# Patient Record
Sex: Male | Born: 1945 | Race: Black or African American | Hispanic: No | Smoking: Never smoker
Health system: Southern US, Community
[De-identification: ages and names within clinical notes are randomized; demographics above are authoritative.]

## PROBLEM LIST (undated history)

## (undated) DIAGNOSIS — F32A Depression, unspecified: Secondary | ICD-10-CM

## (undated) DIAGNOSIS — D649 Anemia, unspecified: Secondary | ICD-10-CM

## (undated) DIAGNOSIS — F329 Major depressive disorder, single episode, unspecified: Secondary | ICD-10-CM

## (undated) DIAGNOSIS — C61 Malignant neoplasm of prostate: Secondary | ICD-10-CM

## (undated) DIAGNOSIS — I10 Essential (primary) hypertension: Secondary | ICD-10-CM

## (undated) DIAGNOSIS — M8660 Other chronic osteomyelitis, unspecified site: Secondary | ICD-10-CM

## (undated) DIAGNOSIS — M869 Osteomyelitis, unspecified: Secondary | ICD-10-CM

## (undated) HISTORY — DX: Major depressive disorder, single episode, unspecified: F32.9

## (undated) HISTORY — DX: Osteomyelitis, unspecified: M86.9

## (undated) HISTORY — DX: Depression, unspecified: F32.A

## (undated) HISTORY — PX: RHINOPLASTY: SUR1284

## (undated) HISTORY — DX: Essential (primary) hypertension: I10

## (undated) HISTORY — PX: PROSTATE BIOPSY: SHX241

## (undated) HISTORY — DX: Anemia, unspecified: D64.9

## (undated) HISTORY — DX: Other chronic osteomyelitis, unspecified site: M86.60

## (undated) HISTORY — PX: HERNIA REPAIR: SHX51

---

## 2001-12-17 ENCOUNTER — Encounter: Payer: Self-pay | Admitting: Emergency Medicine

## 2001-12-17 ENCOUNTER — Inpatient Hospital Stay (HOSPITAL_COMMUNITY): Admission: EM | Admit: 2001-12-17 | Discharge: 2001-12-24 | Payer: Self-pay | Admitting: Emergency Medicine

## 2001-12-18 ENCOUNTER — Encounter: Payer: Self-pay | Admitting: Internal Medicine

## 2001-12-19 ENCOUNTER — Encounter: Payer: Self-pay | Admitting: Internal Medicine

## 2001-12-19 ENCOUNTER — Encounter (INDEPENDENT_AMBULATORY_CARE_PROVIDER_SITE_OTHER): Payer: Self-pay | Admitting: Interventional Cardiology

## 2001-12-21 ENCOUNTER — Encounter: Payer: Self-pay | Admitting: Internal Medicine

## 2001-12-28 ENCOUNTER — Encounter: Admission: RE | Admit: 2001-12-28 | Discharge: 2001-12-28 | Payer: Self-pay | Admitting: Internal Medicine

## 2001-12-31 DIAGNOSIS — M869 Osteomyelitis, unspecified: Secondary | ICD-10-CM

## 2001-12-31 HISTORY — DX: Osteomyelitis, unspecified: M86.9

## 2002-01-04 ENCOUNTER — Encounter: Admission: RE | Admit: 2002-01-04 | Discharge: 2002-01-04 | Payer: Self-pay | Admitting: Internal Medicine

## 2002-01-11 ENCOUNTER — Encounter: Admission: RE | Admit: 2002-01-11 | Discharge: 2002-01-11 | Payer: Self-pay | Admitting: Internal Medicine

## 2002-01-13 ENCOUNTER — Encounter: Admission: RE | Admit: 2002-01-13 | Discharge: 2002-01-13 | Payer: Self-pay | Admitting: Internal Medicine

## 2002-01-16 ENCOUNTER — Encounter: Payer: Self-pay | Admitting: Internal Medicine

## 2002-01-16 ENCOUNTER — Inpatient Hospital Stay (HOSPITAL_COMMUNITY): Admission: AD | Admit: 2002-01-16 | Discharge: 2002-01-23 | Payer: Self-pay | Admitting: Internal Medicine

## 2002-01-16 ENCOUNTER — Encounter: Admission: RE | Admit: 2002-01-16 | Discharge: 2002-01-16 | Payer: Self-pay | Admitting: Internal Medicine

## 2002-01-17 ENCOUNTER — Encounter: Payer: Self-pay | Admitting: Internal Medicine

## 2002-01-18 ENCOUNTER — Encounter: Payer: Self-pay | Admitting: Internal Medicine

## 2002-01-18 ENCOUNTER — Encounter (INDEPENDENT_AMBULATORY_CARE_PROVIDER_SITE_OTHER): Payer: Self-pay | Admitting: *Deleted

## 2002-01-20 ENCOUNTER — Encounter: Payer: Self-pay | Admitting: Internal Medicine

## 2002-01-30 HISTORY — PX: LYMPH NODE BIOPSY: SHX201

## 2002-02-01 ENCOUNTER — Encounter: Admission: RE | Admit: 2002-02-01 | Discharge: 2002-02-01 | Payer: Self-pay | Admitting: Internal Medicine

## 2002-02-03 ENCOUNTER — Emergency Department (HOSPITAL_COMMUNITY): Admission: EM | Admit: 2002-02-03 | Discharge: 2002-02-03 | Payer: Self-pay | Admitting: Emergency Medicine

## 2002-02-09 ENCOUNTER — Encounter: Admission: RE | Admit: 2002-02-09 | Discharge: 2002-02-09 | Payer: Self-pay | Admitting: Internal Medicine

## 2002-02-13 ENCOUNTER — Encounter: Payer: Self-pay | Admitting: Emergency Medicine

## 2002-02-14 ENCOUNTER — Inpatient Hospital Stay (HOSPITAL_COMMUNITY): Admission: EM | Admit: 2002-02-14 | Discharge: 2002-02-19 | Payer: Self-pay | Admitting: Emergency Medicine

## 2002-02-17 ENCOUNTER — Encounter (INDEPENDENT_AMBULATORY_CARE_PROVIDER_SITE_OTHER): Payer: Self-pay | Admitting: *Deleted

## 2002-03-02 DIAGNOSIS — M8660 Other chronic osteomyelitis, unspecified site: Secondary | ICD-10-CM

## 2002-03-02 HISTORY — DX: Other chronic osteomyelitis, unspecified site: M86.60

## 2002-03-08 ENCOUNTER — Encounter: Admission: RE | Admit: 2002-03-08 | Discharge: 2002-03-08 | Payer: Self-pay | Admitting: Internal Medicine

## 2002-04-12 ENCOUNTER — Encounter: Admission: RE | Admit: 2002-04-12 | Discharge: 2002-04-12 | Payer: Self-pay | Admitting: Internal Medicine

## 2002-04-19 ENCOUNTER — Encounter: Payer: Self-pay | Admitting: Internal Medicine

## 2002-04-19 ENCOUNTER — Ambulatory Visit (HOSPITAL_COMMUNITY): Admission: RE | Admit: 2002-04-19 | Discharge: 2002-04-19 | Payer: Self-pay | Admitting: Internal Medicine

## 2002-04-19 DIAGNOSIS — M8618 Other acute osteomyelitis, other site: Secondary | ICD-10-CM

## 2002-04-26 ENCOUNTER — Encounter: Admission: RE | Admit: 2002-04-26 | Discharge: 2002-04-26 | Payer: Self-pay | Admitting: Internal Medicine

## 2002-06-02 ENCOUNTER — Encounter: Admission: RE | Admit: 2002-06-02 | Discharge: 2002-06-02 | Payer: Self-pay | Admitting: Internal Medicine

## 2002-07-08 ENCOUNTER — Emergency Department (HOSPITAL_COMMUNITY): Admission: EM | Admit: 2002-07-08 | Discharge: 2002-07-09 | Payer: Self-pay | Admitting: *Deleted

## 2002-07-09 ENCOUNTER — Encounter: Payer: Self-pay | Admitting: Emergency Medicine

## 2002-07-19 ENCOUNTER — Encounter: Admission: RE | Admit: 2002-07-19 | Discharge: 2002-07-19 | Payer: Self-pay | Admitting: Internal Medicine

## 2002-09-11 ENCOUNTER — Encounter: Admission: RE | Admit: 2002-09-11 | Discharge: 2002-09-11 | Payer: Self-pay | Admitting: Internal Medicine

## 2002-10-15 ENCOUNTER — Encounter: Payer: Self-pay | Admitting: Hematology & Oncology

## 2002-10-15 ENCOUNTER — Ambulatory Visit (HOSPITAL_COMMUNITY): Admission: RE | Admit: 2002-10-15 | Discharge: 2002-10-15 | Payer: Self-pay | Admitting: Hematology & Oncology

## 2002-11-20 ENCOUNTER — Encounter: Admission: RE | Admit: 2002-11-20 | Discharge: 2002-11-20 | Payer: Self-pay | Admitting: Internal Medicine

## 2002-12-06 ENCOUNTER — Ambulatory Visit (HOSPITAL_COMMUNITY): Admission: RE | Admit: 2002-12-06 | Discharge: 2002-12-06 | Payer: Self-pay | Admitting: Internal Medicine

## 2002-12-06 ENCOUNTER — Encounter: Payer: Self-pay | Admitting: Internal Medicine

## 2003-01-02 ENCOUNTER — Ambulatory Visit (HOSPITAL_COMMUNITY): Admission: RE | Admit: 2003-01-02 | Discharge: 2003-01-02 | Payer: Self-pay | Admitting: Internal Medicine

## 2003-01-24 ENCOUNTER — Encounter: Admission: RE | Admit: 2003-01-24 | Discharge: 2003-01-24 | Payer: Self-pay | Admitting: Internal Medicine

## 2003-01-26 ENCOUNTER — Emergency Department (HOSPITAL_COMMUNITY): Admission: EM | Admit: 2003-01-26 | Discharge: 2003-01-26 | Payer: Self-pay | Admitting: *Deleted

## 2003-01-29 ENCOUNTER — Ambulatory Visit (HOSPITAL_COMMUNITY): Admission: RE | Admit: 2003-01-29 | Discharge: 2003-01-29 | Payer: Self-pay | Admitting: Internal Medicine

## 2003-03-07 ENCOUNTER — Encounter: Admission: RE | Admit: 2003-03-07 | Discharge: 2003-03-07 | Payer: Self-pay | Admitting: Internal Medicine

## 2003-03-12 ENCOUNTER — Ambulatory Visit (HOSPITAL_COMMUNITY): Admission: RE | Admit: 2003-03-12 | Discharge: 2003-03-12 | Payer: Self-pay | Admitting: Gastroenterology

## 2003-03-15 ENCOUNTER — Encounter: Admission: RE | Admit: 2003-03-15 | Discharge: 2003-04-06 | Payer: Self-pay | Admitting: Internal Medicine

## 2003-03-21 ENCOUNTER — Encounter: Admission: RE | Admit: 2003-03-21 | Discharge: 2003-03-21 | Payer: Self-pay | Admitting: Internal Medicine

## 2003-03-26 ENCOUNTER — Encounter: Admission: RE | Admit: 2003-03-26 | Discharge: 2003-03-26 | Payer: Self-pay | Admitting: Internal Medicine

## 2003-03-29 ENCOUNTER — Ambulatory Visit (HOSPITAL_COMMUNITY): Admission: RE | Admit: 2003-03-29 | Discharge: 2003-03-29 | Payer: Self-pay | Admitting: Internal Medicine

## 2003-03-29 ENCOUNTER — Encounter: Payer: Self-pay | Admitting: Cardiology

## 2003-08-20 ENCOUNTER — Ambulatory Visit (HOSPITAL_COMMUNITY): Admission: RE | Admit: 2003-08-20 | Discharge: 2003-08-20 | Payer: Self-pay | Admitting: Internal Medicine

## 2003-08-20 ENCOUNTER — Encounter: Admission: RE | Admit: 2003-08-20 | Discharge: 2003-08-20 | Payer: Self-pay | Admitting: Internal Medicine

## 2003-08-23 ENCOUNTER — Encounter: Admission: RE | Admit: 2003-08-23 | Discharge: 2003-08-23 | Payer: Self-pay | Admitting: Internal Medicine

## 2003-09-11 ENCOUNTER — Encounter: Admission: RE | Admit: 2003-09-11 | Discharge: 2003-09-11 | Payer: Self-pay | Admitting: Internal Medicine

## 2003-09-18 ENCOUNTER — Encounter: Admission: RE | Admit: 2003-09-18 | Discharge: 2003-09-18 | Payer: Self-pay | Admitting: Internal Medicine

## 2003-11-12 ENCOUNTER — Ambulatory Visit: Payer: Self-pay | Admitting: Internal Medicine

## 2003-11-14 ENCOUNTER — Ambulatory Visit: Payer: Self-pay | Admitting: Internal Medicine

## 2003-11-26 ENCOUNTER — Ambulatory Visit: Payer: Self-pay | Admitting: Internal Medicine

## 2003-11-30 ENCOUNTER — Ambulatory Visit (HOSPITAL_COMMUNITY): Admission: RE | Admit: 2003-11-30 | Discharge: 2003-11-30 | Payer: Self-pay | Admitting: Internal Medicine

## 2003-11-30 ENCOUNTER — Ambulatory Visit: Payer: Self-pay | Admitting: Internal Medicine

## 2003-12-13 ENCOUNTER — Ambulatory Visit: Payer: Self-pay | Admitting: Internal Medicine

## 2004-05-05 ENCOUNTER — Ambulatory Visit: Payer: Self-pay | Admitting: Internal Medicine

## 2004-06-10 ENCOUNTER — Ambulatory Visit (HOSPITAL_COMMUNITY): Admission: RE | Admit: 2004-06-10 | Discharge: 2004-06-10 | Payer: Self-pay | Admitting: Internal Medicine

## 2004-06-10 ENCOUNTER — Ambulatory Visit: Payer: Self-pay | Admitting: Internal Medicine

## 2004-06-14 ENCOUNTER — Emergency Department (HOSPITAL_COMMUNITY): Admission: EM | Admit: 2004-06-14 | Discharge: 2004-06-14 | Payer: Self-pay | Admitting: Emergency Medicine

## 2004-07-09 ENCOUNTER — Ambulatory Visit (HOSPITAL_COMMUNITY): Admission: RE | Admit: 2004-07-09 | Discharge: 2004-07-09 | Payer: Self-pay | Admitting: Internal Medicine

## 2004-07-09 ENCOUNTER — Ambulatory Visit: Payer: Self-pay | Admitting: Internal Medicine

## 2004-07-30 ENCOUNTER — Ambulatory Visit: Payer: Self-pay | Admitting: Internal Medicine

## 2004-08-26 ENCOUNTER — Ambulatory Visit: Payer: Self-pay | Admitting: Internal Medicine

## 2004-09-01 ENCOUNTER — Emergency Department (HOSPITAL_COMMUNITY): Admission: EM | Admit: 2004-09-01 | Discharge: 2004-09-01 | Payer: Self-pay | Admitting: Emergency Medicine

## 2004-11-02 IMAGING — CR DG CHEST 2V
2 series · 2 of 2 positions shown · non-contrast
Comparison: none

CLINICAL DATA: 57-year-old male short of breath hypertension.
 DIAGNOSTIC CHEST, TWO VIEWS 11/30/03

[view not recorded (1 of 2)]
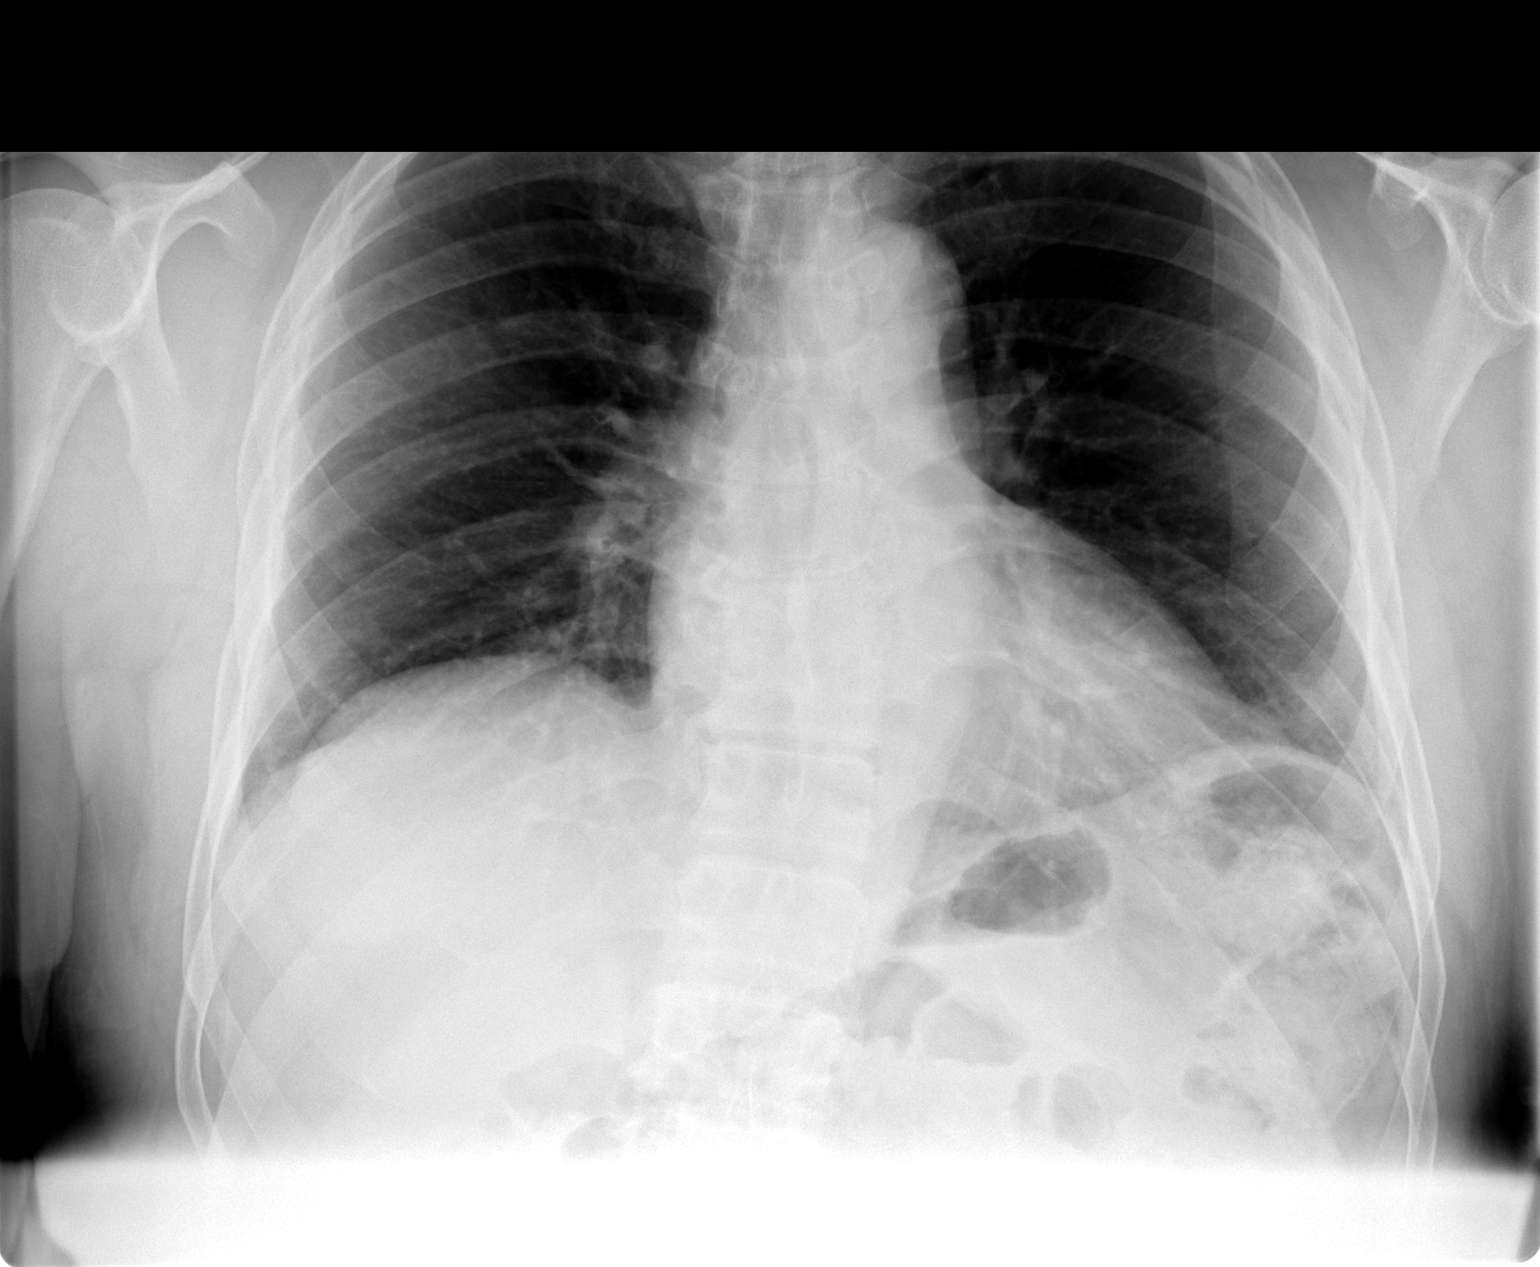

[view not recorded (2 of 2)]
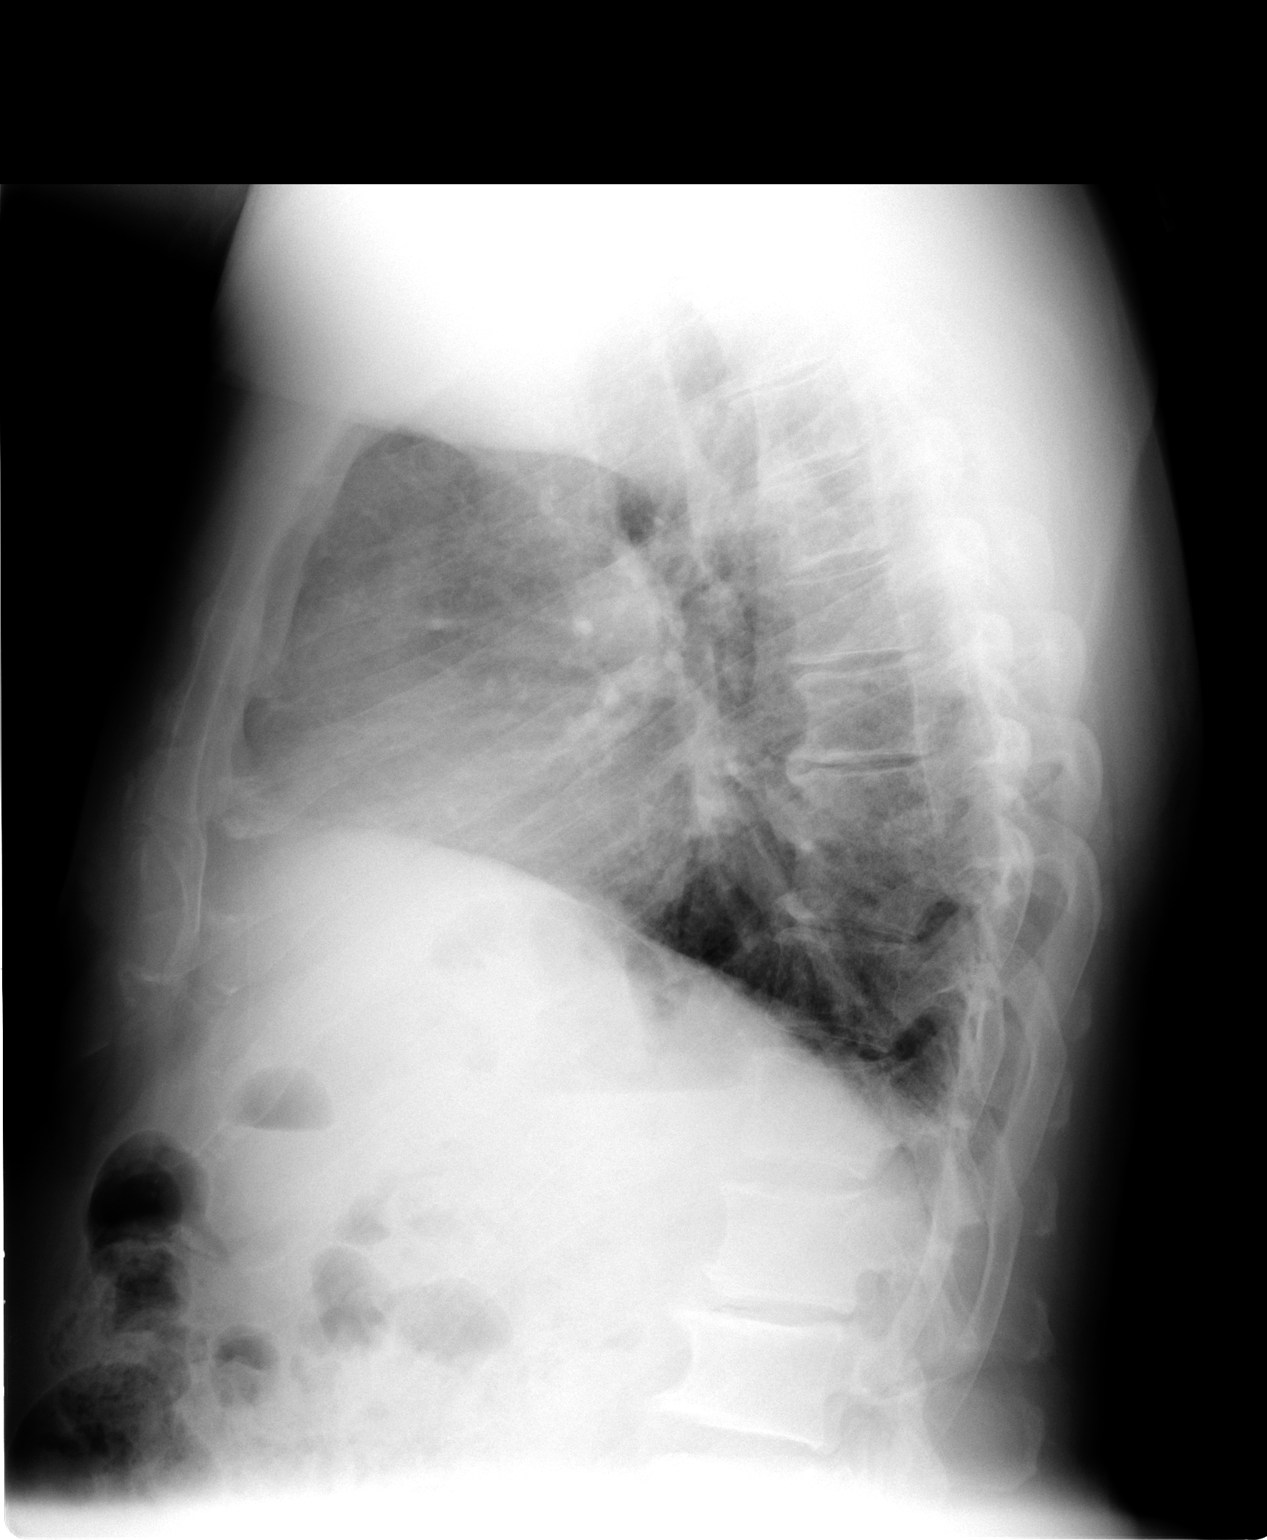

[2 of 2 positions shown; findings below may reference images not displayed]

FINDINGS: Lung volumes are decreased with bibasilar and perihilar atelectasis. Mild central
 peribronchial thickening. No consolidation, effusion, edema, or pneumothorax. Mild scoliosis. On
 the lateral view, there is obliteration of the T8-T9 disc space which is a known area of chronic
 spondylitic discitis
IMPRESSION: 1. Low volume exam and bibasilar atelectasis.
 2. T8-T9 chronic spine changes related to previous spondylitic discitis.

## 2005-02-26 ENCOUNTER — Ambulatory Visit: Payer: Self-pay | Admitting: Internal Medicine

## 2005-03-11 ENCOUNTER — Ambulatory Visit (HOSPITAL_COMMUNITY): Admission: RE | Admit: 2005-03-11 | Discharge: 2005-03-11 | Payer: Self-pay | Admitting: Internal Medicine

## 2005-03-11 ENCOUNTER — Ambulatory Visit: Payer: Self-pay | Admitting: Internal Medicine

## 2005-03-18 ENCOUNTER — Ambulatory Visit: Payer: Self-pay | Admitting: Internal Medicine

## 2005-05-25 ENCOUNTER — Ambulatory Visit: Payer: Self-pay | Admitting: Internal Medicine

## 2005-10-30 ENCOUNTER — Ambulatory Visit: Payer: Self-pay | Admitting: Internal Medicine

## 2005-12-17 ENCOUNTER — Ambulatory Visit: Payer: Self-pay | Admitting: Internal Medicine

## 2005-12-17 DIAGNOSIS — I10 Essential (primary) hypertension: Secondary | ICD-10-CM | POA: Insufficient documentation

## 2005-12-17 DIAGNOSIS — M4800 Spinal stenosis, site unspecified: Secondary | ICD-10-CM | POA: Insufficient documentation

## 2005-12-17 DIAGNOSIS — D649 Anemia, unspecified: Secondary | ICD-10-CM

## 2005-12-17 DIAGNOSIS — R9430 Abnormal result of cardiovascular function study, unspecified: Secondary | ICD-10-CM | POA: Insufficient documentation

## 2005-12-17 DIAGNOSIS — D709 Neutropenia, unspecified: Secondary | ICD-10-CM | POA: Insufficient documentation

## 2005-12-17 DIAGNOSIS — L408 Other psoriasis: Secondary | ICD-10-CM

## 2005-12-17 DIAGNOSIS — R609 Edema, unspecified: Secondary | ICD-10-CM

## 2005-12-29 ENCOUNTER — Encounter (INDEPENDENT_AMBULATORY_CARE_PROVIDER_SITE_OTHER): Payer: Self-pay | Admitting: Internal Medicine

## 2005-12-29 ENCOUNTER — Ambulatory Visit: Payer: Self-pay | Admitting: Internal Medicine

## 2005-12-29 LAB — CONVERTED CEMR LAB
ALT: 22 units/L (ref 0–40)
Albumin: 3.9 g/dL (ref 3.5–5.2)
Alkaline Phosphatase: 62 units/L (ref 39–117)
CO2: 29 meq/L (ref 19–32)
Calcium: 9 mg/dL (ref 8.4–10.5)
Cholesterol: 185 mg/dL (ref 0–200)
HCT: 39.3 % (ref 39.0–52.0)
Hemoglobin: 13.1 g/dL (ref 13.0–17.0)
INR: 1 (ref 0.0–1.5)
MCHC: 33.4 g/dL (ref 30.0–36.0)
Monocytes Absolute: 0.5 10*3/uL (ref 0.2–0.7)
Neutro Abs: 2 10*3/uL (ref 1.7–7.7)
Neutrophils Relative %: 49 % (ref 43–77)
Potassium: 3.6 meq/L (ref 3.5–5.3)
Prothrombin Time: 13.6 s (ref 11.6–15.2)
RBC: 4.37 M/uL (ref 4.22–5.81)
RDW: 12.8 % (ref 11.5–14.0)
Total CHOL/HDL Ratio: 3.9
Triglycerides: 107 mg/dL (ref <150)
aPTT: 29 s (ref 24–37)

## 2006-01-14 ENCOUNTER — Encounter (INDEPENDENT_AMBULATORY_CARE_PROVIDER_SITE_OTHER): Payer: Self-pay | Admitting: *Deleted

## 2006-01-14 ENCOUNTER — Ambulatory Visit: Payer: Self-pay | Admitting: Internal Medicine

## 2006-01-14 LAB — CONVERTED CEMR LAB
CO2: 30 meq/L (ref 19–32)
Glucose, Bld: 98 mg/dL (ref 70–99)
Potassium: 3.9 meq/L (ref 3.5–5.3)
Sodium: 141 meq/L (ref 135–145)

## 2006-02-16 DIAGNOSIS — S22009A Unspecified fracture of unspecified thoracic vertebra, initial encounter for closed fracture: Secondary | ICD-10-CM | POA: Insufficient documentation

## 2006-03-02 DIAGNOSIS — D649 Anemia, unspecified: Secondary | ICD-10-CM

## 2006-03-02 HISTORY — DX: Anemia, unspecified: D64.9

## 2006-04-01 ENCOUNTER — Ambulatory Visit: Payer: Self-pay | Admitting: Internal Medicine

## 2006-04-01 ENCOUNTER — Encounter (INDEPENDENT_AMBULATORY_CARE_PROVIDER_SITE_OTHER): Payer: Self-pay | Admitting: Internal Medicine

## 2006-04-01 DIAGNOSIS — M25519 Pain in unspecified shoulder: Secondary | ICD-10-CM

## 2006-04-01 LAB — CONVERTED CEMR LAB
BUN: 14 mg/dL (ref 6–23)
CO2: 29 meq/L (ref 19–32)
Calcium: 9.3 mg/dL (ref 8.4–10.5)
Creatinine, Ser: 0.94 mg/dL (ref 0.40–1.50)
Glucose, Bld: 106 mg/dL — ABNORMAL HIGH (ref 70–99)

## 2006-06-21 ENCOUNTER — Ambulatory Visit: Payer: Self-pay | Admitting: Internal Medicine

## 2006-08-05 ENCOUNTER — Telehealth (INDEPENDENT_AMBULATORY_CARE_PROVIDER_SITE_OTHER): Payer: Self-pay | Admitting: *Deleted

## 2006-08-29 ENCOUNTER — Emergency Department (HOSPITAL_COMMUNITY): Admission: EM | Admit: 2006-08-29 | Discharge: 2006-08-29 | Payer: Self-pay | Admitting: Emergency Medicine

## 2006-12-24 ENCOUNTER — Ambulatory Visit (HOSPITAL_COMMUNITY): Admission: RE | Admit: 2006-12-24 | Discharge: 2006-12-24 | Payer: Self-pay | Admitting: *Deleted

## 2006-12-24 ENCOUNTER — Ambulatory Visit: Payer: Self-pay | Admitting: *Deleted

## 2006-12-24 ENCOUNTER — Telehealth: Payer: Self-pay | Admitting: *Deleted

## 2007-01-10 ENCOUNTER — Ambulatory Visit: Payer: Self-pay | Admitting: Internal Medicine

## 2007-01-10 ENCOUNTER — Encounter: Payer: Self-pay | Admitting: Internal Medicine

## 2007-01-10 DIAGNOSIS — G4700 Insomnia, unspecified: Secondary | ICD-10-CM

## 2007-01-10 LAB — CONVERTED CEMR LAB
BUN: 17 mg/dL (ref 6–23)
Eosinophils Relative: 5 % (ref 0–5)
Glucose, Bld: 117 mg/dL — ABNORMAL HIGH (ref 70–99)
Lymphs Abs: 1.6 10*3/uL (ref 0.7–4.0)
Monocytes Absolute: 0.3 10*3/uL (ref 0.1–1.0)
Monocytes Relative: 8 % (ref 3–12)
Neutro Abs: 1.7 10*3/uL (ref 1.7–7.7)
Neutrophils Relative %: 45 % (ref 43–77)
Sodium: 142 meq/L (ref 135–145)
WBC: 3.9 10*3/uL — ABNORMAL LOW (ref 4.0–10.5)

## 2007-01-10 LAB — CBC AND DIFFERENTIAL: WBC: 3.9 10^3/mL

## 2007-02-09 ENCOUNTER — Ambulatory Visit: Payer: Self-pay | Admitting: Internal Medicine

## 2007-05-04 ENCOUNTER — Ambulatory Visit: Payer: Self-pay | Admitting: Hospitalist

## 2007-05-04 ENCOUNTER — Encounter (INDEPENDENT_AMBULATORY_CARE_PROVIDER_SITE_OTHER): Payer: Self-pay | Admitting: Internal Medicine

## 2007-05-04 DIAGNOSIS — R350 Frequency of micturition: Secondary | ICD-10-CM | POA: Insufficient documentation

## 2007-05-04 DIAGNOSIS — F524 Premature ejaculation: Secondary | ICD-10-CM

## 2007-05-04 LAB — CONVERTED CEMR LAB
CO2: 26 meq/L (ref 19–32)
Hemoglobin, Urine: NEGATIVE
Ketones, ur: NEGATIVE mg/dL
Leukocytes, UA: NEGATIVE
Nitrite: NEGATIVE
TSH: 1.781 microintl units/mL (ref 0.350–5.50)

## 2007-05-05 ENCOUNTER — Encounter (INDEPENDENT_AMBULATORY_CARE_PROVIDER_SITE_OTHER): Payer: Self-pay | Admitting: Internal Medicine

## 2007-05-09 ENCOUNTER — Telehealth: Payer: Self-pay | Admitting: *Deleted

## 2007-05-18 ENCOUNTER — Ambulatory Visit: Payer: Self-pay | Admitting: Infectious Diseases

## 2007-05-18 DIAGNOSIS — F329 Major depressive disorder, single episode, unspecified: Secondary | ICD-10-CM

## 2007-07-08 ENCOUNTER — Telehealth: Payer: Self-pay | Admitting: *Deleted

## 2007-07-09 ENCOUNTER — Emergency Department (HOSPITAL_COMMUNITY): Admission: EM | Admit: 2007-07-09 | Discharge: 2007-07-09 | Payer: Self-pay | Admitting: Emergency Medicine

## 2007-09-22 ENCOUNTER — Telehealth: Payer: Self-pay | Admitting: Infectious Diseases

## 2008-01-30 ENCOUNTER — Telehealth: Payer: Self-pay | Admitting: Infectious Diseases

## 2008-03-07 ENCOUNTER — Encounter (INDEPENDENT_AMBULATORY_CARE_PROVIDER_SITE_OTHER): Payer: Self-pay | Admitting: *Deleted

## 2008-03-09 ENCOUNTER — Telehealth (INDEPENDENT_AMBULATORY_CARE_PROVIDER_SITE_OTHER): Payer: Self-pay | Admitting: *Deleted

## 2008-03-12 ENCOUNTER — Telehealth (INDEPENDENT_AMBULATORY_CARE_PROVIDER_SITE_OTHER): Payer: Self-pay | Admitting: *Deleted

## 2008-03-14 ENCOUNTER — Encounter (INDEPENDENT_AMBULATORY_CARE_PROVIDER_SITE_OTHER): Payer: Self-pay | Admitting: Internal Medicine

## 2008-03-14 ENCOUNTER — Ambulatory Visit: Payer: Self-pay | Admitting: Internal Medicine

## 2008-03-14 ENCOUNTER — Encounter (INDEPENDENT_AMBULATORY_CARE_PROVIDER_SITE_OTHER): Payer: Self-pay | Admitting: *Deleted

## 2008-03-14 LAB — CONVERTED CEMR LAB
Calcium: 8.7 mg/dL (ref 8.4–10.5)
Creatinine, Ser: 0.96 mg/dL (ref 0.40–1.50)
Potassium: 3.9 meq/L (ref 3.5–5.3)
Sodium: 140 meq/L (ref 135–145)

## 2008-03-28 ENCOUNTER — Encounter: Payer: Self-pay | Admitting: Internal Medicine

## 2008-03-28 ENCOUNTER — Ambulatory Visit: Payer: Self-pay | Admitting: Internal Medicine

## 2008-03-28 LAB — CONVERTED CEMR LAB
CO2: 27 meq/L (ref 19–32)
Calcium: 9.4 mg/dL (ref 8.4–10.5)
Cholesterol: 162 mg/dL (ref 0–200)
LDL Cholesterol: 91 mg/dL (ref 0–99)
VLDL: 23 mg/dL (ref 0–40)

## 2008-04-03 ENCOUNTER — Ambulatory Visit: Payer: Self-pay | Admitting: Vascular Surgery

## 2008-04-03 ENCOUNTER — Ambulatory Visit: Payer: Self-pay | Admitting: Internal Medicine

## 2008-04-03 ENCOUNTER — Ambulatory Visit: Admission: RE | Admit: 2008-04-03 | Discharge: 2008-04-03 | Payer: Self-pay | Admitting: Internal Medicine

## 2008-04-03 ENCOUNTER — Encounter (INDEPENDENT_AMBULATORY_CARE_PROVIDER_SITE_OTHER): Payer: Self-pay | Admitting: Internal Medicine

## 2008-04-03 ENCOUNTER — Encounter: Payer: Self-pay | Admitting: Internal Medicine

## 2008-04-03 DIAGNOSIS — M79609 Pain in unspecified limb: Secondary | ICD-10-CM

## 2008-04-05 ENCOUNTER — Ambulatory Visit: Payer: Self-pay | Admitting: *Deleted

## 2008-04-06 ENCOUNTER — Telehealth (INDEPENDENT_AMBULATORY_CARE_PROVIDER_SITE_OTHER): Payer: Self-pay | Admitting: *Deleted

## 2008-05-22 ENCOUNTER — Ambulatory Visit: Payer: Self-pay | Admitting: Internal Medicine

## 2008-05-22 ENCOUNTER — Encounter (INDEPENDENT_AMBULATORY_CARE_PROVIDER_SITE_OTHER): Payer: Self-pay | Admitting: Internal Medicine

## 2008-05-23 LAB — CONVERTED CEMR LAB
BUN: 16 mg/dL (ref 6–23)
CO2: 27 meq/L (ref 19–32)
Creatinine, Ser: 1.12 mg/dL (ref 0.40–1.50)
Glucose, Bld: 105 mg/dL — ABNORMAL HIGH (ref 70–99)
Sodium: 139 meq/L (ref 135–145)

## 2008-06-05 ENCOUNTER — Ambulatory Visit: Payer: Self-pay | Admitting: Internal Medicine

## 2008-06-05 ENCOUNTER — Encounter: Payer: Self-pay | Admitting: Internal Medicine

## 2008-06-06 LAB — CONVERTED CEMR LAB
BUN: 18 mg/dL (ref 6–23)
CO2: 28 meq/L (ref 19–32)
Calcium: 9.4 mg/dL (ref 8.4–10.5)
Creatinine, Ser: 1.18 mg/dL (ref 0.40–1.50)
GFR calc Af Amer: >60 mL/min (ref >60)
Sodium: 140 meq/L (ref 135–145)

## 2008-06-26 ENCOUNTER — Telehealth: Payer: Self-pay | Admitting: *Deleted

## 2008-06-28 ENCOUNTER — Encounter (INDEPENDENT_AMBULATORY_CARE_PROVIDER_SITE_OTHER): Payer: Self-pay | Admitting: *Deleted

## 2008-06-28 ENCOUNTER — Ambulatory Visit: Payer: Self-pay | Admitting: Internal Medicine

## 2008-06-28 LAB — CONVERTED CEMR LAB
Creatinine,U: 188.5 mg/dL
Methadone: NEGATIVE
Opiates: NEGATIVE
Propoxyphene: NEGATIVE

## 2008-07-05 ENCOUNTER — Encounter: Payer: Self-pay | Admitting: Gastroenterology

## 2008-12-10 ENCOUNTER — Ambulatory Visit (HOSPITAL_COMMUNITY): Admission: RE | Admit: 2008-12-10 | Discharge: 2008-12-10 | Payer: Self-pay | Admitting: Internal Medicine

## 2008-12-10 ENCOUNTER — Ambulatory Visit: Payer: Self-pay | Admitting: Internal Medicine

## 2009-02-18 ENCOUNTER — Emergency Department (HOSPITAL_COMMUNITY): Admission: EM | Admit: 2009-02-18 | Discharge: 2009-02-18 | Payer: Self-pay | Admitting: Family Medicine

## 2009-02-18 ENCOUNTER — Telehealth: Payer: Self-pay | Admitting: Internal Medicine

## 2009-02-28 ENCOUNTER — Telehealth (INDEPENDENT_AMBULATORY_CARE_PROVIDER_SITE_OTHER): Payer: Self-pay | Admitting: *Deleted

## 2009-04-09 ENCOUNTER — Telehealth: Payer: Self-pay | Admitting: Internal Medicine

## 2009-05-23 ENCOUNTER — Ambulatory Visit: Payer: Self-pay | Admitting: Internal Medicine

## 2009-05-31 ENCOUNTER — Encounter: Payer: Self-pay | Admitting: Internal Medicine

## 2009-06-06 ENCOUNTER — Ambulatory Visit: Payer: Self-pay | Admitting: Internal Medicine

## 2009-06-06 LAB — CONVERTED CEMR LAB

## 2009-07-17 ENCOUNTER — Telehealth: Payer: Self-pay | Admitting: Internal Medicine

## 2009-09-04 ENCOUNTER — Telehealth: Payer: Self-pay | Admitting: *Deleted

## 2009-09-04 ENCOUNTER — Ambulatory Visit: Payer: Self-pay | Admitting: Internal Medicine

## 2009-09-04 DIAGNOSIS — L0292 Furuncle, unspecified: Secondary | ICD-10-CM | POA: Insufficient documentation

## 2009-09-04 DIAGNOSIS — L0293 Carbuncle, unspecified: Secondary | ICD-10-CM

## 2009-09-06 ENCOUNTER — Telehealth (INDEPENDENT_AMBULATORY_CARE_PROVIDER_SITE_OTHER): Payer: Self-pay | Admitting: *Deleted

## 2009-10-22 ENCOUNTER — Ambulatory Visit: Payer: Self-pay | Admitting: Internal Medicine

## 2009-10-22 ENCOUNTER — Ambulatory Visit (HOSPITAL_COMMUNITY): Admission: RE | Admit: 2009-10-22 | Discharge: 2009-10-22 | Payer: Self-pay | Admitting: Internal Medicine

## 2009-10-22 DIAGNOSIS — S59919A Unspecified injury of unspecified forearm, initial encounter: Secondary | ICD-10-CM

## 2009-10-22 DIAGNOSIS — S59909A Unspecified injury of unspecified elbow, initial encounter: Secondary | ICD-10-CM | POA: Insufficient documentation

## 2009-10-22 DIAGNOSIS — S6990XA Unspecified injury of unspecified wrist, hand and finger(s), initial encounter: Secondary | ICD-10-CM

## 2009-10-31 ENCOUNTER — Telehealth: Payer: Self-pay | Admitting: Internal Medicine

## 2010-01-06 ENCOUNTER — Ambulatory Visit: Payer: Self-pay | Admitting: Internal Medicine

## 2010-03-14 ENCOUNTER — Telehealth (INDEPENDENT_AMBULATORY_CARE_PROVIDER_SITE_OTHER): Payer: Self-pay | Admitting: *Deleted

## 2010-04-01 NOTE — Assessment & Plan Note (Addendum)
Summary: NEEDMEDICATION BACK PAIN/SB.   Vital Signs:  Patient profile:   65 year old male Height:      69 inches (175.26 cm) Weight:      226.2 pounds (102.82 kg) BMI:     33.52 Temp:     97.4 degrees F Pulse rate:   66 / minute BP sitting:   163 / 95  (left arm) Cuff size:   large  Vitals Entered By: Dorie Rank RN (January 06, 2010 4:16 PM) CC: check up - needs refills on meds - need to go to [place] [facility] on [address] - just got out of court - divorced, Depression Is Patient Diabetic? No Pain Assessment Patient in pain? yes     Location: back Intensity: 5 Type: aching Onset of pain  Chronic Nutritional Status BMI of > 30 = obese  Does patient need assistance? Functional Status Self care Ambulation Normal   Primary Care Provider:  Mliss Sax MD  CC:  check up - needs refills on meds - need to go to [facility] on [address]) - just got out of court - divorced and Depression.  History of Present Illness: Pt is a 65 yo AAM with PMH of HTN and chronic back pain came here for regular follow up and med refills. He has run out of his HCTZ and norvasc for about 2 weeks. He still has chronic back pain and responds well to vicodin. He has no other c/o, including CP, SOB, HA, fever, melana or chematochezia. No specific abdominal or urinary concerns. No smoking, ETOH, or drug abuse.    Depression History:      The patient denies a depressed mood most of the day and a diminished interest in his usual daily activities.        Comments:  "doing  better since I got out of court -  I am going to get out of town and take a break".   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 Take 1 Tablet By Mouth Once A Day 2)  Aspirin 81 Mg Tabs (Aspirin) .... Take 1 Tablet By Mouth Once A Day 3)  Hydrochlorothiazide 25 Mg Tabs (Hydrochlorothiazide) .... Take 1 Tablet By Mouth Once A Day 4)  Vicodin 5-500 Mg Tabs (Hydrocodone-Acetaminophen) .... Take One Tab By Mouth Up To Every 6 Hours As Needed For Back Pain 5)  Amlodipine  Besylate 5 Mg  Tabs (Amlodipine Besylate) .Marland Kitchen.. 1 By Mouth Every Day 6)  Doxycycline Hyclate 100 Mg Tabs (Doxycycline Hyclate) .... Take 1 Tablet Twice Daily For 10 Days  Current Medications (verified): 1)  Lisinopril 40 Mg Tabs (Lisinopril) .... Take 1 Tablet By Mouth Once A Day 2)  Aspirin 81 Mg Tabs (Aspirin) .... Take 1 Tablet By Mouth Once A Day 3)  Hydrochlorothiazide 25 Mg Tabs (Hydrochlorothiazide) .... Take 1 Tablet By Mouth Once A Day 4)  Vicodin 5-500 Mg Tabs (Hydrocodone-Acetaminophen) .... Take One Tab By Mouth Up To Every 6 Hours As Needed For Back Pain 5)  Amlodipine Besylate 5 Mg  Tabs (Amlodipine Besylate) .Marland Kitchen.. 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  DEPRESSIVE DISORDER NOT ELSEWHERE CLASSIFIED (ICD-311) PREMATURE EJACULATION (ICD-302.75)   attributed to amlodipine versus depression INSOMNIA UNSPECIFIED (ICD-780.52)  CARDIOVASCULAR STUDIES, ABNORMAL (ICD-794.30)   2D echo 03/29/03: EF 55-65%, no hypokinesis (resolved), PA pressures normal PSORIASIS (ICD-696.1)    Family History: Last updated: 07/01/08 M: died at 67, had open heart surgery sometime prior. Lost vision. F: "open heart surgery" in 34s Sister with DM Brother: suicide Other siblings healthy.  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  Risk Factors: Smoking Status: never (01/06/2010) Passive Smoke Exposure: no (01/06/2010)  Family History: Reviewed history from 07/01/08 and no changes required. M: died at 54, had open heart surgery sometime prior. Lost vision. F: "open heart surgery" in 26s Sister with DM Brother: suicide Other siblings healthy.  Social History: Reviewed history from Jul 01, 2008 and no changes required. 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  Review of Systems  The patient denies fever, chest pain, syncope, dyspnea on exertion, peripheral edema, prolonged cough, abdominal pain, melena, and hematochezia.    Physical Exam  General:  alert, well-developed, well-nourished, and well-hydrated.   Nose:  no nasal discharge.     Mouth:  pharynx pink and moist.   Neck:  supple.   Lungs:  normal respiratory effort, no intercostal retractions, normal breath sounds, no crackles, and no wheezes.   Heart:  normal rate, regular rhythm, no murmur, and no JVD.   Abdomen:  soft, non-tender, normal bowel sounds, and no distention.   Msk:  normal ROM, no joint tenderness, no joint swelling, and no joint warmth.  Mild tenderness in his mid back.  Pulses:  2+ Extremities:  No edema.  Neurologic:  alert & oriented X3, cranial nerves II-XII intact, strength normal in all extremities, sensation intact to light touch, and gait normal.     Impression & Recommendations:  Problem # 1:  HYPERTENSION (ICD-401.9) Assessment Deteriorated His increased BP is likely due to running out of his HTN meds. Will refill these for him and recheck BP at  next visit in 2-3 months. May adjust the dosing and  recheck BMET then.  His updated medication list for  this problem includes:    Lisinopril 40 Mg Tabs (Lisinopril) .Marland Kitchen... Take 1 tablet by mouth once a day    Hydrochlorothiazide 25 Mg Tabs (Hydrochlorothiazide) .Marland Kitchen... Take 1 tablet by mouth once a day    Amlodipine Besylate 5 Mg Tabs (Amlodipine besylate) .Marland Kitchen... 1 by mouth every day  BP today: 163/95 Prior BP: 151/86 (10/22/2009)  Prior 10 Yr Risk Heart Disease: 14 % (06/21/2006)  Labs Reviewed: K+: 3.6 (06/05/2008) Creat: : 1.18 (06/05/2008)   Chol: 162 (03/28/2008)   HDL: 48 (03/28/2008)   LDL: 91 (03/28/2008)   TG: 116 (03/28/2008)  Problem # 2:  BACK PAIN, THORACIC REGION (ICD-724.1) Assessment: Improved His back pain well controlled with vicodin. Will continue vicodin as needed.   His updated medication list for this problem includes:    Aspirin 81 Mg Tabs (Aspirin) .Marland Kitchen... Take 1 tablet by mouth once a day    Vicodin 5-500 Mg Tabs (Hydrocodone-acetaminophen) .Marland Kitchen... Take one tab by mouth up to every 6 hours as needed for back pain  Problem # 3:  OBESITY (ICD-278.00) Assessment:  Deteriorated He gained about 5 lbs and advised him weight loss and exercise.  Ht: 69 (01/06/2010)   Wt: 226.2 (01/06/2010)   BMI: 33.52 (01/06/2010)  Problem # 4:  Preventive Health Care (ICD-V70.0) Assessment: Comment Only Will check FOBT and give him flu shot. Orders: T-Hemoccult Card-Multiple (take home) (60454)  Complete Medication List: 1)  Lisinopril 40 Mg Tabs (Lisinopril) .... Take 1 tablet by mouth once a day 2)  Aspirin 81 Mg Tabs (Aspirin) .... Take 1 tablet by mouth once a day 3)  Hydrochlorothiazide 25 Mg Tabs (Hydrochlorothiazide) .... Take 1 tablet by mouth once a day 4)  Vicodin 5-500 Mg Tabs (Hydrocodone-acetaminophen) .... Take one tab by mouth up to every 6 hours as needed for back pain 5)  Amlodipine Besylate 5 Mg Tabs (Amlodipine besylate) .Marland Kitchen.. 1 by mouth every day  Other Orders: Influenza Vaccine MCR (09811)  Patient Instructions: 1)  Please schedule a follow-up appointment in 2-3 months. 2)  It is important that you exercise regularly at least 20 minutes 5 times a week. If you develop chest pain, have severe difficulty breathing, or feel very tired , stop exercising immediately and seek medical attention. 3)  You need to lose weight. Consider a lower calorie diet and regular exercise.  Prescriptions: VICODIN 5-500 MG TABS (HYDROCODONE-ACETAMINOPHEN) take one tab by mouth up to every 6 hours as needed for back pain  #60 x 0   Entered and Authorized by:   Jackson Latino MD   Signed by:   Jackson Latino MD on 01/06/2010   Method used:   Print then Give to Patient   RxID:   [id number] AMLODIPINE BESYLATE 5 MG  TABS (AMLODIPINE BESYLATE) 1 by mouth every day  #90 x 5   Entered and Authorized by:   Jackson Latino MD   Signed by:   Jackson Latino MD on 01/06/2010   Method used:   Electronically to        [facility] [phone]* (retail)       [address]       [place], [place]  [address]       Ph: [phone]       Fax: [phone]   RxID:    [id number] HYDROCHLOROTHIAZIDE 25 MG TABS (HYDROCHLOROTHIAZIDE) Take 1 tablet by mouth once a day  #31 x 5   Entered and Authorized by:   Jackson Latino MD   Signed by:  Jackson Latino MD on 01/06/2010   Method used:   Electronically to        [facility] [phone]* (retail)       [address]       [place], [place]  [address]       Ph: [phone]       Fax: [phone]   RxID:   [id number]    Orders Added: 1)  T-Hemoccult Card-Multiple (take home) [82270] 2)  Influenza Vaccine MCR [00025] 3)  Est. Patient Level IV [06301]   Immunizations Administered:  Influenza Vaccine # 1:    Vaccine Type: Fluvax MCR    Site: right deltoid    Mfr: GlaxoSmithKline    Dose: 0.5 ml    Route: IM    Given by: Dorie Rank RN    Exp. Date: 08/30/2010    Lot #: [id number]    VIS given: 09/24/09 version given January 06, 2010.  Flu Vaccine Consent Questions:    Do you have a history of severe allergic reactions to this vaccine? no    Any prior history of allergic reactions to egg and/or gelatin? no    Do you have a sensitivity to the preservative Thimersol? no    Do you have a past history of Guillan-Barre Syndrome? no    Do you currently have an acute febrile illness? no    Have you ever had a severe reaction to latex? no    Vaccine information given and explained to patient? yes   Immunizations Administered:  Influenza Vaccine # 1:    Vaccine Type: Fluvax MCR    Site: right deltoid    Mfr: GlaxoSmithKline    Dose: 0.5 ml    Route: IM    Given by: Dorie Rank RN    Exp. Date: 08/30/2010    Lot #: [id number]    VIS given: 09/24/09 version given January 06, 2010. Process Orders Not all tests in this order have been sent for requisitioning Pending tests not yet sent for requisitioning:    Prevention & Chronic Care Immunizations   Influenza vaccine: Fluvax MCR  (01/06/2010)   Influenza vaccine deferral: Not indicated  (05/23/2009)    Tetanus booster:  Not documented   Td booster deferral: Not indicated  (05/23/2009)    Pneumococcal vaccine: Not documented    H. zoster vaccine: Not documented   H. zoster vaccine deferral: Not indicated  (05/23/2009)  Colorectal Screening   Hemoccult: Not documented   Hemoccult action/deferral: Ordered  (01/06/2010)    Colonoscopy: Not documented   Colonoscopy action/deferral: Refused  (10/22/2009)  Other Screening   PSA: Not documented   PSA action/deferral: Discussed-decision deferred  (06/06/2009)   Smoking status: never  (01/06/2010)  Lipids   Total Cholesterol: 162  (03/28/2008)   LDL: 91  (03/28/2008)   LDL Direct: Not documented   HDL: 48  (03/28/2008)   Triglycerides: 116  (03/28/2008)  Hypertension   Last Blood Pressure: 163 / 95  (01/06/2010)   Serum creatinine: 1.18  (06/05/2008)   BMP action: Deferred   Serum potassium 3.6  (06/05/2008)    Hypertension flowsheet reviewed?: Yes   Progress toward BP goal: Deteriorated  Self-Management Support :   Personal Goals (by the next clinic visit) :      Personal blood pressure goal: 140/90  (05/23/2009)   Patient will work on the following items until the next clinic visit to reach self-care goals:     Medications and monitoring: take my medicines every  day, check my blood pressure  (01/06/2010)     Eating: drink diet soda or water instead of juice or soda, eat foods that are low in salt, eat baked foods instead of fried foods  (01/06/2010)     Activity: take a 30 minute walk every day, join a walking program  (01/06/2010)    Hypertension self-management support: Written self-care plan  (01/06/2010)   Hypertension self-care plan printed.   Nursing Instructions: Give Flu vaccine today Provide Hemoccult cards with instructions (see order)    Process Orders Not all tests in this order have been sent for requisitioning Pending tests not yet sent for requisitioning:    Appended Document: Results from Hemoccult [facility]  [facility]    Lab Visit  Laboratory Results  Date/Time Received: March 20, 2010 11:24 AM  Date/Time Reported: Burke Keels  March 20, 2010 11:25 AM   Stool - Occult Blood Hemmoccult #1: negative Hemoccult #2: negative Hemoccult #3: negative Comments: [facility] RESEARCH HEMOCCULT CARDS GIVEN TO PATIENT BY MONIQUE ON 01-06-10 AND ANALYZED BY [facility]  Vickie Maxine Glenn  March 20, 2010 11:26 AM   Orders Today:

## 2010-04-01 NOTE — Progress Notes (Signed)
Summary: refill/gg  Phone Note Refill Request  on September 06, 2009 12:53 PM  Refills Requested: Medication #1:  VICODIN 5-500 MG TABS take one tab by mouth up to every 6 hours as needed for back pain   Last Refilled: 07/18/2009  Method Requested: Fax to Local Pharmacy Initial call taken by: Merrie Roof RN,  September 06, 2009 12:53 PM  Follow-up for Phone Call        Refill approved-nurse to complete Follow-up by: Julaine Fusi  DO,  September 06, 2009 5:09 PM  Additional Follow-up for Phone Call Additional follow up Details #1::        Rx called to pharmacy Additional Follow-up by: Merrie Roof RN,  September 06, 2009 5:55 PM    Prescriptions: VICODIN 5-500 MG TABS (HYDROCODONE-ACETAMINOPHEN) take one tab by mouth up to every 6 hours as needed for back pain  #120 x 0   Entered by:   Julaine Fusi  DO   Authorized by:   Mliss Sax MD   Signed by:   Julaine Fusi  DO on 09/06/2009   Method used:   Telephoned to ...       [facility] [address] [phone]* (retail)       [address]       [place], [place]  [address]       Ph: [phone]       Fax: [phone]   RxID:   [id number]

## 2010-04-01 NOTE — Assessment & Plan Note (Signed)
Summary: see note/pcp-magick/hla   Vital Signs:  Patient profile:   65 year old male Height:      69 inches Weight:      222.05 pounds BMI:     32.91 Temp:     97 degrees F oral Pulse rate:   81 / minute BP sitting:   136 / 80  (left arm)  Vitals Entered By: Angelina Ok RN (September 04, 2009 1:59 PM) CC: Depression Is Patient Diabetic? No Pain Assessment Patient in pain? yes     Location: lowerabdomen Intensity: 5 Type: sharp Onset of pain  Constant Nutritional Status BMI of > 30 = obese  Have you ever been in a relationship where you felt threatened, hurt or afraid?No   Does patient need assistance? Functional Status Self care Ambulation Normal Comments Bowel on left lower side of abdomen.  Hot to touch.  Needs refills on meds.   Primary Care Provider:  Mliss Sax MD  CC:  Depression.  History of Present Illness: Pt is a 65 yo AAM came here left abdominal wall sore. This has been two days since he noticed and no fever, but has mild drainage of white fluid. He has no prior symptoms, denies any sick contacts, diarrhea or other symptoms. He has been taking ibuprofen and helps the pain. Denies smoking, ETOH or drugs.   Depression History:      The patient denies a depressed mood most of the day and a diminished interest in his usual daily activities.         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 Take 1 Tablet By Mouth Once A Day 2)  Aspirin 81 Mg Tabs (Aspirin) .... Take 1 Tablet By Mouth Once A Day 3)  Hydrochlorothiazide 25 Mg Tabs (Hydrochlorothiazide) .... Take 1 Tablet By Mouth Once A Day 4)  Vicodin 5-500 Mg Tabs (Hydrocodone-Acetaminophen) .... Take One Tab By Mouth Up To Every 6 Hours As Needed For Back Pain 5)  Amlodipine Besylate 5 Mg  Tabs (Amlodipine Besylate) .Marland Kitchen.. 1 By Mouth Every Day  Current Medications (verified): 1)  Lisinopril 40 Mg Tabs (Lisinopril) .... Take 1 Tablet By Mouth Once A Day 2)  Aspirin 81 Mg Tabs (Aspirin) .... Take 1 Tablet By Mouth Once A Day 3)  Hydrochlorothiazide 25 Mg Tabs (Hydrochlorothiazide) .... Take 1 Tablet By Mouth Once A Day 4)  Vicodin 5-500 Mg Tabs (Hydrocodone-Acetaminophen) .... Take One Tab By Mouth Up To Every 6 Hours As Needed For Back Pain 5)  Amlodipine Besylate 5 Mg  Tabs (Amlodipine Besylate) .Marland KitchenMarland KitchenMarland Kitchen  1 By Mouth Every Day  Allergies (verified): No Known Drug Allergies  Past History:  Past Medical History: Last updated: 07-21-08 Hypertension T8-9 osteomyelitis with compression fracture (11/03)   chronic back pain History of neutropenia, resolved.  DEPRESSIVE DISORDER NOT ELSEWHERE CLASSIFIED (ICD-311) PREMATURE EJACULATION (ICD-302.75)   attributed to amlodipine versus depression INSOMNIA UNSPECIFIED (ICD-780.52)  CARDIOVASCULAR STUDIES, ABNORMAL (ICD-794.30)   2D echo 03/29/03: EF 55-65%, no hypokinesis (resolved), PA pressures normal PSORIASIS (ICD-696.1)    Family History: Last updated: 07/21/2008 M: died at 67, had open heart surgery  sometime prior. Lost vision. F: "open heart surgery" in 52s Sister with DM Brother: suicide Other siblings healthy.  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  Risk Factors: Exercise: no (06/06/2009)  Risk Factors: Smoking Status: never (09/04/2009) Passive Smoke Exposure: no (09/04/2009)  Family History: Reviewed history from Jul 21, 2008 and no changes required. M: died at 12, had open heart surgery sometime prior. Lost vision. F: "open heart surgery" in 48s Sister with DM Brother: suicide Other siblings healthy.  Social History: Reviewed history from 07-21-2008 and no changes required. 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  Review of Systems  The patient denies fever, vision loss, chest pain, dyspnea on exertion, peripheral edema, prolonged cough, and headaches.    Physical Exam  General:  alert, well-developed, well-nourished, and well-hydrated.   Nose:  no nasal discharge.   Mouth:  pharynx pink and moist.   Neck:  supple.   Abdomen:  soft, non-tender, normal bowel sounds, and no distention.  There is a 2 cm skin infection and skin broken, no significant drainage seen, swelling around it.  Msk:  normal ROM, no joint tenderness, and no joint swelling.     Impression & Recommendations:  Problem # 1:  CARBUNCLE AND FURUNCLE OF UNSPECIFIED SITE (ICD-680.9) Assessment New He has the boil in his abdominal wall and no risk factors such as DM or sexual risk, will give bactrim for 7 days. Will not have wound Cx.  Advised him to return if no improvement.   Problem # 2:  HYPERTENSION (ICD-401.9) Assessment: Unchanged Well controlled and at goal. Will continue current meds.  His updated medication list for this problem includes:    Lisinopril 40 Mg Tabs (Lisinopril) .Marland Kitchen... Take 1 tablet by mouth once a day    Hydrochlorothiazide 25 Mg Tabs (Hydrochlorothiazide) .Marland Kitchen... Take 1 tablet by mouth once a day    Amlodipine Besylate 5 Mg Tabs (Amlodipine besylate) .Marland Kitchen... 1 by mouth every day  BP today: 136/80 Prior BP: 134/90 (06/06/2009)  Prior 10 Yr Risk Heart Disease: 14 % (06/21/2006)  Labs Reviewed: K+: 3.6 (06/05/2008) Creat: : 1.18 (06/05/2008)   Chol: 162 (03/28/2008)   HDL: 48 (03/28/2008)   LDL: 91 (03/28/2008)   TG: 116 (03/28/2008)  Complete Medication List: 1)  Lisinopril 40 Mg Tabs (Lisinopril) .... Take 1 tablet by mouth once a day 2)  Aspirin 81 Mg Tabs (Aspirin) .... Take 1 tablet by mouth once a day 3)  Hydrochlorothiazide 25 Mg Tabs (Hydrochlorothiazide) .... Take 1  tablet by mouth once a day 4)  Vicodin 5-500 Mg Tabs (Hydrocodone-acetaminophen) .... Take one tab by mouth up to every 6 hours as needed for back pain 5)  Amlodipine Besylate 5 Mg Tabs (Amlodipine besylate) .Marland Kitchen.. 1 by mouth every day 6)  Bactrim Ds 800-160 Mg Tabs (Sulfamethoxazole-trimethoprim) .... Take 1 tablet by mouth two times a day for 7 days  Patient Instructions: 1)  Please schedule a follow-up appointment in 2 weeks. 2)  Please come to Clinic or ED if your boil become worse.  Prescriptions: BACTRIM DS 800-160 MG TABS (SULFAMETHOXAZOLE-TRIMETHOPRIM) Take 1 tablet by mouth two times a day for 7 days  #14 x 0   Entered and Authorized by:   Jackson Latino MD   Signed by:   Jackson Latino MD on 09/04/2009   Method used:   Print then Give to Patient   RxID:   [id number]   Prevention & Chronic Care Immunizations   Influenza vaccine: Fluvax 3+  (12/10/2008)   Influenza vaccine deferral: Not indicated   (05/23/2009)    Tetanus booster: Not documented   Td booster deferral: Not indicated  (05/23/2009)    Pneumococcal vaccine: Not documented    H. zoster vaccine: Not documented   H. zoster vaccine deferral: Not indicated  (05/23/2009)  Colorectal Screening   Hemoccult: Not documented   Hemoccult action/deferral: Refused  (06/06/2009)    Colonoscopy: Not documented  Other Screening   PSA: Not documented   PSA action/deferral: Discussed-decision deferred  (06/06/2009)   Smoking status: never  (09/04/2009)  Lipids   Total Cholesterol: 162  (03/28/2008)   LDL: 91  (03/28/2008)   LDL Direct: Not documented   HDL: 48  (03/28/2008)   Triglycerides: 116  (03/28/2008)  Hypertension   Last Blood Pressure: 136 / 80  (09/04/2009)   Serum creatinine: 1.18  (06/05/2008)   Serum potassium 3.6  (06/05/2008)    Hypertension flowsheet reviewed?: Yes   Progress toward BP goal: Unchanged  Self-Management Support :   Personal Goals (by the next clinic visit) :      Personal blood pressure goal: 140/90  (05/23/2009)   Patient will work on the following items until the next clinic visit to reach self-care goals:     Medications and monitoring: take my medicines every day, check my blood pressure, bring all of my medications to every visit  (09/04/2009)     Eating: drink diet soda or water instead of juice or soda, eat more vegetables, use fresh or frozen vegetables, eat foods that are low in salt, eat baked foods instead of fried foods, eat fruit for snacks and desserts, limit or avoid alcohol  (09/04/2009)     Activity: take a 30 minute walk every day  (09/04/2009)    Hypertension self-management support: Written self-care plan, Education handout, Pre-printed educational material, Resources for patients handout  (09/04/2009)   Hypertension self-care plan printed.   Hypertension education handout printed      Resource handout printed.     Vital Signs:  Patient profile:   64 year old  male Height:      69 inches Weight:      222.05 pounds BMI:     32.91 Temp:     97 degrees F oral Pulse rate:   81 / minute BP sitting:   136 / 80  (left arm)  Vitals Entered By: Angelina Ok RN (September 04, 2009 1:59 PM)

## 2010-04-01 NOTE — Progress Notes (Signed)
Summary: refill/gg  Phone Note Refill Request  on Jul 17, 2009 2:51 PM  Refills Requested: Medication #1:  HYDROCHLOROTHIAZIDE 25 MG TABS Take 1 tablet by mouth once a day  Medication #2:  LISINOPRIL 40 MG TABS Take 1 tablet by mouth once a day  Medication #3:  VICODIN 5-500 MG TABS take one tab by mouth up to every 6 hours as needed for back pain   Last Refilled: 05/23/2009  Medication #4:  AMLODIPINE BESYLATE 5 MG  TABS 1 by mouth every day.  Method Requested: Electronic Initial call taken by: Merrie Roof RN,  Jul 17, 2009 2:52 PM  Follow-up for Phone Call        completed refill, thank you Mikai Meints  Follow-up by: Mliss Sax MD,  Jul 18, 2009 4:16 PM    Prescriptions: VICODIN 5-500 MG TABS (HYDROCODONE-ACETAMINOPHEN) take one tab by mouth up to every 6 hours as needed for back pain  #120 x 0   Entered and Authorized by:   Mliss Sax MD   Signed by:   Mliss Sax MD on 07/18/2009   Method used:   Print then Give to Patient   RxID:   [id number] AMLODIPINE BESYLATE 5 MG  TABS (AMLODIPINE BESYLATE) 1 by mouth every day  #90 x 5   Entered and Authorized by:   Mliss Sax MD   Signed by:   Mliss Sax MD on 07/18/2009   Method used:   Electronically to        [facility] [phone]* (retail)       [address]       [place], [place]  [address]       Ph: [phone]       Fax: [phone]   RxID:   [id number] HYDROCHLOROTHIAZIDE 25 MG TABS (HYDROCHLOROTHIAZIDE) Take 1 tablet by mouth once a day  #31 x 5   Entered and Authorized by:   Mliss Sax MD   Signed by:   Mliss Sax MD on 07/18/2009   Method used:   Electronically to        [facility] [phone]* (retail)       [address]       [place], [place]  [address]       Ph: [phone]       Fax: [phone]   RxID:   [id number] LISINOPRIL 40 MG TABS (LISINOPRIL) Take 1 tablet by mouth once a day  #31 x 5   Entered and Authorized by:   Mliss Sax MD   Signed by:   Mliss Sax  MD on 07/18/2009   Method used:   Electronically to        [facility] [phone]* (retail)       [address]       [place], [place]  [address]       Ph: [phone]       Fax: [phone]   RxID:   [id number]   Appended Document: refill/gg vicodin called into [facility]

## 2010-04-01 NOTE — Progress Notes (Signed)
Summary: L abd swelling and pain/ hla  Phone Note Call from Patient   Summary of Call: pt calls c/o  area on L abd at belt area that for appr 2 days he has had pain, swelling and yellow drainage from the center of this area, states he "had some fever last night and i took some ibuprofen and it went away" denies actually taking temp, just states he could tell. no other complaints. appt is given for 1400/ dr Threasa Beards Initial call taken by: Marin Roberts RN,  September 04, 2009 9:50 AM  Follow-up for Phone Call        needs eval. thxs. Follow-up by: Julaine Fusi  DO,  September 04, 2009 10:19 AM

## 2010-04-01 NOTE — Assessment & Plan Note (Signed)
Summary: FU/SB.   Vital Signs:  Patient profile:   65 year old male Height:      69 inches (175.26 cm) Weight:      221.6 pounds (100.73 kg) BMI:     32.84 Temp:     97.6 degrees F (36.44 degrees C) oral Pulse rate:   62 / minute BP sitting:   151 / 86  (right arm)  Vitals Entered By: Stanton Kidney Ditzler RN (October 22, 2009 2:39 PM) Is Patient Diabetic? No Pain Assessment Patient in pain? yes     Location: right elbow Intensity: 4 Type: hurts Onset of pain  past 2 daysde Nutritional Status BMI of > 30 = obese Nutritional Status Detail appetite good  Have you ever been in a relationship where you felt threatened, hurt or afraid?denies   Does patient need assistance? Functional Status Self care Ambulation Normal Comments FU - ck right elbow - knot and pink drainage x 2 days . Some swelling.   Primary Care Provider:  Mliss Sax MD   History of Present Illness: 65 yo male with PMH outlined below presents to [facility] for regular follow up appointment. His main concern today is right elbow swelling that he first noticed 3 days ago and he can not recall any provoking factors, denies any trauma to the area. The swelling is getting better and he denies any tenderness. He did notice small amount of reddish to yellowish fluid come out of it one he hit himself into a car door. He denies any additional pus from the area. He has no other concerns at the time. No recent sicknesses or hospitalizaitons. No episodes of chest pain, SOB, palpitations, no fever or chills. No specific abdominal or urinary concerns. No recent changes in appetite, weight, sleep patterns, mood.    Depression History:      The patient denies a depressed mood most of the day and a diminished interest in his usual daily activities.  The patient denies significant weight loss, significant weight gain, insomnia, hypersomnia, psychomotor agitation, psychomotor retardation, fatigue (loss of energy), feelings of worthlessness  (guilt), impaired concentration (indecisiveness), and recurrent thoughts of death or suicide.        The patient denies that he feels like life is not worth living, denies that he wishes that he were dead, and denies that he has thought about ending his life.         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 Take 1 Tablet By Mouth Once A Day 2)  Aspirin 81 Mg Tabs (Aspirin) .... Take 1 Tablet By Mouth Once A Day 3)  Hydrochlorothiazide 25 Mg Tabs (Hydrochlorothiazide) .... Take 1 Tablet By Mouth Once A Day 4)  Vicodin 5-500 Mg Tabs (Hydrocodone-Acetaminophen) .... Take One Tab By Mouth Up To Every 6 Hours As Needed For Back Pain 5)  Amlodipine Besylate 5 Mg  Tabs (Amlodipine Besylate) .Marland Kitchen.. 1 By Mouth Every Day 6)  Bactrim Ds 800-160 Mg Tabs (Sulfamethoxazole-Trimethoprim) .... Take 1 Tablet By Mouth Two Times A Day For 7  Days  Current Medications (verified): 1)  Lisinopril 40 Mg Tabs (Lisinopril) .... Take 1 Tablet By Mouth Once A Day 2)  Aspirin 81 Mg Tabs (Aspirin) .... Take 1 Tablet By Mouth Once A Day 3)  Hydrochlorothiazide 25 Mg Tabs (Hydrochlorothiazide) .... Take 1 Tablet By Mouth Once A Day 4)  Vicodin 5-500 Mg Tabs (Hydrocodone-Acetaminophen) .... Take One Tab By Mouth Up To Every 6 Hours As Needed For Back Pain 5)  Amlodipine Besylate 5 Mg  Tabs (Amlodipine Besylate) .Marland Kitchen.. 1 By Mouth Every Day 6)  Bactrim Ds 800-160 Mg Tabs (Sulfamethoxazole-Trimethoprim) .... Take 1 Tablet By Mouth Two Times A Day For 7 Days  Allergies (verified): No Known Drug Allergies  Past History:  Past Medical History: Last updated: 2008/07/21 Hypertension T8-9 osteomyelitis with compression fracture (11/03)   chronic back pain History of neutropenia, resolved.  DEPRESSIVE DISORDER NOT ELSEWHERE CLASSIFIED (ICD-311) PREMATURE EJACULATION (ICD-302.75)   attributed to amlodipine versus depression INSOMNIA UNSPECIFIED (ICD-780.52)  CARDIOVASCULAR STUDIES, ABNORMAL (ICD-794.30)   2D echo 03/29/03: EF 55-65%, no hypokinesis (resolved), PA pressures normal PSORIASIS (ICD-696.1)    Family History: Last updated: 07/21/2008 M: died at 36, had open heart surgery sometime prior. Lost vision. F: "open heart surgery" in 47s Sister with DM Brother: suicide Other siblings healthy.  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  Risk Factors: Exercise: no (10/22/2009)  Risk Factors: Smoking Status: never (10/22/2009) Passive Smoke Exposure: no (10/22/2009)  Family History: Reviewed history from Jul 21, 2008 and no changes required. M: died at 73, had open heart surgery  sometime prior. Lost vision. F: "open heart surgery" in 18s Sister with DM Brother: suicide Other siblings healthy.  Social History: Reviewed history from 21-Jul-2008 and no changes required. 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  Review of Systems       per HPI  Physical Exam  General:  Well-developed,well-nourished,in no acute distress; alert,appropriate and cooperative throughout examination Lungs:  Normal respiratory effort, chest expands symmetrically. Lungs are clear to auscultation, no crackles or wheezes. Heart:  Normal rate and regular rhythm. S1 and S2 normal without gallop, murmur, click, rub or other extra sounds. Abdomen:  soft, non-tender, normal bowel sounds, and no distention.  There is a 2 cm skin infection and skin broken, no significant drainage seen, swelling around it.  Msk:  normal ROM and no joint tenderness, swelling over the right elbow area present, hard to touch but no tenderness to palpation, no  fluid palpable, not movable, no pus draining, warm to touch and with swelling extending to forearm Pulses:  R and L carotid,radial,femoral,dorsalis pedis and posterior tibial pulses are full and equal bilaterally Neurologic:  No cranial nerve deficits noted. Station and gait are normal. Plantar reflexes are down-going bilaterally. DTRs are symmetrical throughout. Sensory, motor and coordinative functions appear intact.   Impression & Recommendations:  Problem # 1:  INJURY OTHER&UNSPECIFIED ELBOW FOREARM&WRIST (ICD-959.3)  Nodule like growth on the elbow with right forewrm swelling most worrisome for cellulitis. I have discussed this case with Dr. Rogelia Boga and we were not able to find a pocket of fluid on the exam that we could I and D and the agreement was to  send pt home on abd for 10 day and to have him back in 1-2 weeks to re-evaluate. I will also get xray of the right upper extremitiy. Differential includes olecenon bursitis, infection, ?trauma, OA/RA 9unlikely in the absence of other sympotm and absence of pain).   Orders: Radiology other (Radiology Other)  Problem # 2:  HYPERTENSION (ICD-401.9) Not at goal, pt did not take any meds in the past few days, he said he ran out of some of them.  I have discussed this with patient in detail and explain the importance of medical compliance. Will not change the medication regimen today since increasing the dosing will not help if noncompliance is the issue. Simplification of the regimen is most likely the best approach to this problem so will work on this first. I have asked patient to come back in 1-2 weeks with the paperwork indicating BP numbers and will readjust the regimen as indicated.   His updated medication list for this problem includes:    Lisinopril 40 Mg Tabs (Lisinopril) .Marland Kitchen... Take 1 tablet by mouth once a day    Hydrochlorothiazide 25 Mg Tabs (Hydrochlorothiazide) .Marland Kitchen... Take 1 tablet by mouth once a day    Amlodipine Besylate 5 Mg Tabs (Amlodipine besylate) .Marland Kitchen... 1 by mouth every day  BP today: 151/86 Prior BP: 136/80 (09/04/2009)  Prior 10 Yr Risk Heart Disease: 14 % (06/21/2006)  Labs Reviewed: K+: 3.6 (06/05/2008) Creat: : 1.18 (06/05/2008)   Chol: 162 (03/28/2008)   HDL: 48 (03/28/2008)   LDL: 91 (03/28/2008)   TG: 116 (03/28/2008)  Problem # 3:  OBESITY (ICD-278.00)  Ht: 69 (10/22/2009)   Wt: 221.6 (10/22/2009)   BMI: 32.84 (10/22/2009)  Problem # 4:  PSORIASIS (ICD-696.1) No change.  Complete Medication List: 1)  Lisinopril 40 Mg Tabs (Lisinopril) .... Take 1 tablet by mouth once a day 2)  Aspirin 81 Mg Tabs (Aspirin) .... Take 1 tablet by mouth once a day 3)  Hydrochlorothiazide 25 Mg Tabs (Hydrochlorothiazide) .... Take 1 tablet by mouth once a day 4)  Vicodin 5-500 Mg  Tabs (Hydrocodone-acetaminophen) .... Take one tab by mouth up to every 6 hours as needed for back pain 5)  Amlodipine Besylate 5 Mg Tabs (Amlodipine besylate) .Marland Kitchen.. 1 by mouth every day 6)  Doxycycline Hyclate 100 Mg Tabs (Doxycycline hyclate) .... Take 1 tablet twice daily for 10 days  Patient Instructions: 1)  Please schedule a follow-up appointment in 3 months. Prescriptions: AMLODIPINE BESYLATE 5 MG  TABS (AMLODIPINE BESYLATE) 1 by mouth every day  #90 x 5   Entered and Authorized by:   Mliss Sax MD   Signed by:   Mliss Sax MD on 10/22/2009   Method used:   Electronically to        [facility] [address] [id number] (retail)       [address]       [place], [place]  [address]       Ph: [phone]       Fax: [phone]   RxID:   [id number] HYDROCHLOROTHIAZIDE 25 MG TABS (HYDROCHLOROTHIAZIDE) Take 1 tablet by mouth once a day  #31 x 5   Entered and Authorized by:   Mliss Sax MD   Signed by:   Mliss Sax MD on 10/22/2009   Method used:   Electronically to        [facility] [facility] [address] [id number] (retail)       [address]       [place], [place]  [address]       Ph: [phone]       Fax: [phone]   RxID:   [id number] LISINOPRIL 40 MG TABS (LISINOPRIL) Take 1 tablet by mouth once a day  #31 x 5   Entered and Authorized by:   Mliss Sax MD   Signed by:   Mliss Sax MD on 10/22/2009   Method used:   Electronically to        [facility] [address] [id number] (retail)       [address]       [place], [place]  [address]       Ph: [phone]       Fax: [phone]   RxID:   [id number] DOXYCYCLINE HYCLATE 100 MG TABS (DOXYCYCLINE HYCLATE) take 1 tablet twice daily for 10 days  #20 x 0   Entered and Authorized by:   Mliss Sax MD   Signed by:   Mliss Sax MD on 10/22/2009   Method used:   Electronically to        [facility] [address] [phone]* (retail)       [address]       [place], [place]  [address]       Ph: [phone]       Fax: [phone]   RxID:   [id number]    Prevention & Chronic Care Immunizations   Influenza vaccine: Fluvax 3+  (12/10/2008)   Influenza vaccine deferral: Not indicated  (05/23/2009)    Tetanus booster: Not documented   Td booster deferral: Not indicated  (05/23/2009)    Pneumococcal vaccine: Not documented    H. zoster vaccine: Not documented   H. zoster vaccine deferral: Not indicated  (05/23/2009)  Colorectal Screening   Hemoccult: Not documented   Hemoccult action/deferral: Deferred  (10/22/2009)    Colonoscopy: Not documented   Colonoscopy action/deferral: Refused  (10/22/2009)  Other Screening   PSA: Not documented   PSA action/deferral: Discussed-decision deferred  (06/06/2009)   Smoking status: never  (10/22/2009)  Lipids   Total Cholesterol: 162  (03/28/2008)   LDL: 91  (03/28/2008)   LDL Direct: Not documented   HDL: 48  (03/28/2008)   Triglycerides: 116  (03/28/2008)  Hypertension   Last Blood Pressure: 151 / 86  (10/22/2009)   Serum creatinine: 1.18  (06/05/2008)   BMP action: Deferred   Serum potassium 3.6  (06/05/2008)    Hypertension flowsheet reviewed?: Yes   Progress toward BP goal: Deteriorated  Self-Management Support :   Personal Goals (by the next clinic visit) :  Personal blood pressure goal: 140/90  (05/23/2009)   Patient will work on the following items until the next clinic visit to reach self-care goals:     Medications and monitoring: take my medicines every day, bring all of my medications to every visit  (10/22/2009)     Eating: eat more vegetables, use fresh or frozen vegetables, eat foods that are low in salt, eat baked foods instead of fried foods, eat fruit for snacks and desserts, limit or avoid alcohol  (10/22/2009)     Activity: take a 30 minute walk every day, take the stairs instead of the elevator  (10/22/2009)    Hypertension self-management  support: Written self-care plan, Education handout, Resources for patients handout  (10/22/2009)   Hypertension self-care plan printed.   Hypertension education handout printed      Resource handout printed.

## 2010-04-01 NOTE — Letter (Signed)
Summary: FOR PERMAMENT DISABILTIY/HANDICAPPED  FOR PERMAMENT DISABILTIY/HANDICAPPED   Imported By: Margie Billet 05/31/2009 16:12:41  _____________________________________________________________________  External Attachment:    Type:   Image     Comment:   External Document

## 2010-04-01 NOTE — Progress Notes (Signed)
Summary: refill/gg  Phone Note Refill Request  on October 31, 2009 4:53 PM  Refills Requested: Medication #1:  VICODIN 5-500 MG TABS take one tab by mouth up to every 6 hours as needed for back pain   Last Refilled: 09/06/2009  Method Requested: Telephone to Pharmacy Initial call taken by: Merrie Roof RN,  October 31, 2009 4:53 PM  Follow-up for Phone Call        completed refill, thank you Iskra  Follow-up by: Mliss Sax MD,  November 10, 2009 10:50 PM    Prescriptions: VICODIN 5-500 MG TABS (HYDROCODONE-ACETAMINOPHEN) take one tab by mouth up to every 6 hours as needed for back pain  #120 x 0   Entered and Authorized by:   Mliss Sax MD   Signed by:   Mliss Sax MD on 11/10/2009   Method used:   Telephoned to ...       [facility] [address] [phone]* (retail)       [address]       [place], [place]  [address]       Ph: [phone]       Fax: [phone]   RxID:   [id number]   Appended Document: refill/gg Rx called in

## 2010-04-01 NOTE — Assessment & Plan Note (Signed)
Summary: CHECKUP/SB.   Vital Signs:  Patient profile:   65 year old male Height:      69 inches (175.26 cm) Weight:      226.9 pounds (101.05 kg) BMI:     32.95 Temp:     98.0 degrees F (36.67 degrees C) oral Pulse rate:   86 / minute BP sitting:   168 / 108  (left arm) Cuff size:   regular  Vitals Entered By: Theotis Barrio NT II (May 23, 2009 4:24 PM) CC: HANDICAP CARD / ROUTIN OFFICE VISIT/ MEDICATION REFILL Is Patient Diabetic? No Pain Assessment Patient in pain? yes     Location: back Intensity:        5 Onset of pain    CHRONIC Nutritional Status BMI of > 30 = obese  Have you ever been in a relationship where you felt threatened, hurt or afraid?No   Does patient need assistance? Functional Status Self care Ambulation Normal Comments HANDICAP CARD  /  ROUTINE OFFICE VISIT   /MEDICATION REFILL   CC:  HANDICAP CARD / ROUTIN OFFICE VISIT/ MEDICATION REFILL.  Preventive Screening-Counseling & Management  Alcohol-Tobacco     Smoking Status: never     Passive Smoke Exposure: no  Caffeine-Diet-Exercise     Does Patient Exercise: no  Allergies: No Known Drug Allergies   Impression & Recommendations:  Problem # 1:  HYPERTENSION (ICD-401.9) Compliance is still an issue. I have discussed this in detail with the patient. He said he will try to stay compliant. WIll see him in 2-3 weeks for BP check and will readjust the regimen.  His updated medication list for this problem includes:    Lisinopril 40 Mg Tabs (Lisinopril) .Marland Kitchen... Take 1 tablet by mouth once a day    Hydrochlorothiazide 25 Mg Tabs (Hydrochlorothiazide) .Marland Kitchen... Take 1 tablet by mouth once a day    Amlodipine Besylate 5 Mg Tabs (Amlodipine besylate) .Marland Kitchen... 1 by mouth every day  BP today: 168/108 Prior BP: 159/96 (12/10/2008)  Prior 10 Yr Risk Heart Disease: 14 % (06/21/2006)  Labs Reviewed: K+: 3.6 (06/05/2008) Creat: : 1.18 (06/05/2008)   Chol: 162 (03/28/2008)   HDL: 48 (03/28/2008)   LDL: 91  (03/28/2008)   TG: 116 (03/28/2008)  Problem # 2:  BACK PAIN, THORACIC REGION (ICD-724.1) Continue the same regimen.  His updated medication list for this problem includes:    Aspirin 81 Mg Tabs (Aspirin) .Marland Kitchen... Take 1 tablet by mouth once a day    Vicodin 5-500 Mg Tabs (Hydrocodone-acetaminophen) .Marland Kitchen... Take one tab by mouth up to every 6 hours as needed for back pain  Complete Medication List: 1)  Lisinopril 40 Mg Tabs (Lisinopril) .... Take 1 tablet by mouth once a day 2)  Aspirin 81 Mg Tabs (Aspirin) .... Take 1 tablet by mouth once a day 3)  Hydrochlorothiazide 25 Mg Tabs (Hydrochlorothiazide) .... Take 1 tablet by mouth once a day 4)  Vicodin 5-500 Mg Tabs (Hydrocodone-acetaminophen) .... Take one tab by mouth up to every 6 hours as needed for back pain 5)  Amlodipine Besylate 5 Mg Tabs (Amlodipine besylate) .Marland Kitchen.. 1 by mouth every day  Patient Instructions: 1)  Please schedule a follow-up appointment in 2 weeks. 2)  Please check your blood pressure regularly, if it is >170 please call clinic at [phone] 3)  Most patients (90%) with low back pain will improve with time (2-6 weeks). Keep active but avoid activities that are painful. Apply moist heat and/or ice to lower back several times  a day. 4)  It is important that you exercise regularly at least 20 minutes 5 times a week. If you develop chest pain, have severe difficulty breathing, or feel very tired , stop exercising immediately and seek medical attention. 5)  Take an Aspirin every day. Prescriptions: VICODIN 5-500 MG TABS (HYDROCODONE-ACETAMINOPHEN) take one tab by mouth up to every 6 hours as needed for back pain  #120 x 0   Entered and Authorized by:   Mliss Sax MD   Signed by:   Mliss Sax MD on 05/23/2009   Method used:   Print then Give to Patient   RxID:   [id number]    Prevention & Chronic Care Immunizations   Influenza vaccine: Fluvax 3+  (12/10/2008)   Influenza vaccine deferral: Not indicated   (05/23/2009)    Tetanus booster: Not documented   Td booster deferral: Not indicated  (05/23/2009)    Pneumococcal vaccine: Not documented    H. zoster vaccine: Not documented   H. zoster vaccine deferral: Not indicated  (05/23/2009)  Colorectal Screening   Hemoccult: Not documented   Hemoccult action/deferral: Refused  (05/23/2009)    Colonoscopy: Not documented  Other Screening   PSA: Not documented   PSA action/deferral: Discussion deferred  (05/23/2009)   Smoking status: never  (05/23/2009)  Lipids   Total Cholesterol: 162  (03/28/2008)   LDL: 91  (03/28/2008)   LDL Direct: Not documented   HDL: 48  (03/28/2008)   Triglycerides: 116  (03/28/2008)  Hypertension   Last Blood Pressure: 168 / 108  (05/23/2009)   Serum creatinine: 1.18  (06/05/2008)   Serum potassium 3.6  (06/05/2008)    Hypertension flowsheet reviewed?: Yes   Progress toward BP goal: Deteriorated  Self-Management Support :   Personal Goals (by the next clinic visit) :      Personal blood pressure goal: 140/90  (05/23/2009)   Patient will work on the following items until the next clinic visit to reach self-care goals:     Medications and monitoring: take my medicines every day, check my blood sugar, check my blood pressure, bring all of my medications to every visit, weigh myself weekly, examine my feet every day  (05/23/2009)     Eating: drink diet soda or water instead of juice or soda, eat more vegetables, use fresh or frozen vegetables, eat foods that are low in salt, eat baked foods instead of fried foods, eat fruit for snacks and desserts, limit or avoid alcohol  (05/23/2009)    Hypertension self-management support: Resources for patients handout, Referred for medical nutrition therapy, Written self-care plan  (05/23/2009)   Hypertension self-care plan printed.      Resource handout printed.   Nursing Instructions: Refer for medical nutrition therapy (see order)    Diabetes Self  Management Training Referral Patient Name: Jeremy Hill Date Of Birth: Jun 16, 1945 MRN: [id number] Current Diagnosis:  HEEL PAIN, LEFT (ICD-729.5) OBESITY (ICD-278.00) HYPERTENSION (ICD-401.9) COMPRESSION FRACTURE, THORACIC VERTEBRA (ICD-805.2) BACK PAIN, THORACIC REGION (ICD-724.1) DEPRESSIVE DISORDER NOT ELSEWHERE CLASSIFIED (ICD-311) PREMATURE EJACULATION (ICD-302.75) LEG PAIN, RIGHT (ICD-729.5) FREQUENCY, URINARY (ICD-788.41) PREVENTIVE HEALTH CARE (ICD-V70.0) INSOMNIA UNSPECIFIED (ICD-780.52) SHOULDER PAIN, LEFT (ICD-719.41) OSTEOMYELITIS, ACUTE, OTHER SPEC SITE (ICD-730.08) CARDIOVASCULAR STUDIES, ABNORMAL (ICD-794.30) NEUTROPENIA NOS (ICD-288.00) ANEMIA (ICD-285.9) EDEMA (ICD-782.3) PSORIASIS (ICD-696.1) SPECIAL SCREENING FOR MALIGNANT NEOPLASMS COLON (ICD-V76.51)

## 2010-04-01 NOTE — Progress Notes (Signed)
Summary: refill/ hla  Phone Note Refill Request Message from:  Patient on April 09, 2009 4:13 PM  Refills Requested: Medication #1:  VICODIN 5-500 MG TABS take one tab by mouth up to every 6 hours as needed for back pain   Dosage confirmed as above?Dosage Confirmed   Last Refilled: 12/30 Initial call taken by: Marin Roberts RN,  April 09, 2009 4:13 PM  Follow-up for Phone Call        completed, will bring to clinic this afternoon Follow-up by: Mliss Sax MD,  April 10, 2009 11:36 AM    Prescriptions: VICODIN 5-500 MG TABS (HYDROCODONE-ACETAMINOPHEN) take one tab by mouth up to every 6 hours as needed for back pain  #120 x 0   Entered and Authorized by:   Mliss Sax MD   Signed by:   Mliss Sax MD on 04/10/2009   Method used:   Handwritten   RxID:   [id number]   Appended Document: refill/ hla called to pharm, pharm will call pt

## 2010-04-01 NOTE — Assessment & Plan Note (Signed)
Summary: EST-2 WEEK RECHECK/CH   Vital Signs:  Patient profile:   65 year old male Height:      69 inches (175.26 cm) Weight:      228.6 pounds (103.91 kg) BMI:     33.88 Temp:     97.8 degrees F (36.56 degrees C) oral Pulse rate:   86 / minute BP sitting:   134 / 90  (right arm)  Vitals Entered By: Stanton Kidney Ditzler RN (June 06, 2009 4:08 PM) Is Patient Diabetic? No Pain Assessment Patient in pain? yes     Location: back Intensity: 5 Type: chronic Onset of pain  since '03 Nutritional Status BMI of > 30 = obese Nutritional Status Detail appetite good  Have you ever been in a relationship where you felt threatened, hurt or afraid?denies   Does patient need assistance? Functional Status Self care Ambulation Normal Comments FU better   Primary Care Provider:  Mliss Sax MD   History of Present Illness: 65 yo male with PMH outlined below presents to [facility] for regular follow up appointment, blood pressure check. He has no concerns at the time. No recent sicknesses or hospitalizaitons. No episodes of chest pain, SOB, palpitations. No specific abdominal or urinary concerns. No recent changes in appetite, weight, sleep patterns, mood. He reports being compliant with his medications and has been checking his BP at the drug store like we recommended and his BP was usually in 120s/80's.    Depression History:      The patient denies a depressed mood most of the day and a diminished interest in his usual daily activities.  The patient denies significant weight loss, significant weight gain, insomnia, hypersomnia, psychomotor agitation, psychomotor retardation, fatigue (loss of energy), feelings of worthlessness (guilt), impaired concentration (indecisiveness), and recurrent thoughts of death or suicide.        The patient denies that he feels like life is not worth living, denies that he wishes that he were dead, and denies that he has thought about ending his life.          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 Take 1 Tablet By Mouth Once A Day 2)  Aspirin 81 Mg Tabs (Aspirin) .... Take 1 Tablet By Mouth Once A Day 3)  Hydrochlorothiazide 25 Mg Tabs (Hydrochlorothiazide) .... Take 1 Tablet By Mouth Once A Day 4)  Vicodin 5-500 Mg Tabs (Hydrocodone-Acetaminophen) .... Take One Tab By Mouth Up To Every 6 Hours As Needed For Back Pain 5)  Amlodipine Besylate 5 Mg  Tabs (Amlodipine Besylate) .Marland Kitchen.. 1 By Mouth Every Day  Current Medications (verified): 1)  Lisinopril 40 Mg Tabs (Lisinopril) .... Take 1 Tablet By Mouth Once A Day 2)  Aspirin 81 Mg Tabs (Aspirin) .... Take 1 Tablet By Mouth Once A Day 3)  Hydrochlorothiazide 25 Mg Tabs (Hydrochlorothiazide) .... Take 1 Tablet By Mouth Once A Day 4)  Vicodin 5-500 Mg Tabs (Hydrocodone-Acetaminophen) .... Take One Tab By Mouth Up To Every 6 Hours As Needed For Back Pain 5)  Amlodipine Besylate 5 Mg  Tabs (Amlodipine  Besylate) .Marland Kitchen.. 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  DEPRESSIVE DISORDER NOT ELSEWHERE CLASSIFIED (ICD-311) PREMATURE EJACULATION (ICD-302.75)   attributed to amlodipine versus depression INSOMNIA UNSPECIFIED (ICD-780.52)  CARDIOVASCULAR STUDIES, ABNORMAL (ICD-794.30)   2D echo 03/29/03: EF 55-65%, no hypokinesis (resolved), PA pressures normal PSORIASIS (ICD-696.1)    Family History: Last updated: 2008/07/07 M: died at 83, had open heart surgery sometime prior. Lost vision. F: "open heart surgery" in 98s Sister with DM Brother: suicide Other siblings healthy.  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  Risk Factors: Exercise: no (06/06/2009)  Family History: Reviewed history from 07-07-2008 and no changes required. M: died at 89, had open heart surgery sometime prior. Lost vision. F: "open heart surgery" in 40s Sister with DM Brother: suicide Other siblings healthy.  Social History: Reviewed history from Jul 07, 2008 and no changes required. 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  Review of Systems       per HPI   Physical Exam  General:  Well-developed,well-nourished,in no acute distress; alert,appropriate and cooperative throughout examination Lungs:  Normal respiratory effort, chest expands  symmetrically. Lungs are clear to auscultation, no crackles or wheezes. Heart:  Normal rate and regular rhythm. S1 and S2 normal without gallop, murmur, click, rub or other extra sounds. Abdomen:  Bowel sounds positive,abdomen soft and non-tender without masses, organomegaly or hernias noted. Psych:  Cognition and judgment appear intact. Alert and cooperative with normal attention span and concentration. No apparent delusions, illusions, hallucinations   Impression & Recommendations:  Problem # 1:  HYPERTENSION (ICD-401.9) Significant improvement. I encouraged the patient to continue the same regimen and to come back in 1 month when we will do the same thing, will check his BP. No need for new labs since checked on last appointment and was WNL. His updated medication list for this problem includes:    Lisinopril 40 Mg Tabs (Lisinopril) .Marland Kitchen... Take 1 tablet by mouth once a day    Hydrochlorothiazide 25 Mg Tabs (Hydrochlorothiazide) .Marland Kitchen... Take 1 tablet by mouth once a day    Amlodipine Besylate 5 Mg Tabs (Amlodipine besylate) .Marland Kitchen... 1 by mouth every day  BP today: 134/90 Prior BP: 168/108 (05/23/2009)  Prior 10 Yr Risk Heart Disease: 14 % (06/21/2006)  Labs Reviewed: K+: 3.6 (06/05/2008) Creat: :  1.18 (06/05/2008)   Chol: 162 (03/28/2008)   HDL: 48 (03/28/2008)   LDL: 91 (03/28/2008)   TG: 116 (03/28/2008)  Problem # 2:  BACK PAIN, THORACIC REGION (ICD-724.1) Well controlled on current regimen, we will not make any changes at this time.  His updated medication list for this problem includes:    Aspirin 81 Mg Tabs (Aspirin) .Marland Kitchen... Take 1 tablet by mouth once a day    Vicodin 5-500 Mg Tabs (Hydrocodone-acetaminophen) .Marland Kitchen... Take one tab by mouth up to every 6 hours as needed for back pain  Complete Medication List: 1)  Lisinopril 40 Mg Tabs (Lisinopril) .... Take 1 tablet by mouth once a day 2)  Aspirin 81 Mg Tabs (Aspirin) .... Take 1 tablet by mouth once a day 3)  Hydrochlorothiazide 25 Mg  Tabs (Hydrochlorothiazide) .... Take 1 tablet by mouth once a day 4)  Vicodin 5-500 Mg Tabs (Hydrocodone-acetaminophen) .... Take one tab by mouth up to every 6 hours as needed for back pain 5)  Amlodipine Besylate 5 Mg Tabs (Amlodipine besylate) .Marland Kitchen.. 1 by mouth every day  Patient Instructions: 1)  Please schedule a follow-up appointment in 1 month. 2)  Please check your blood pressure regularly, if it is >170 please call clinic at [phone]   Prevention & Chronic Care Immunizations   Influenza vaccine: Fluvax 3+  (12/10/2008)   Influenza vaccine deferral: Not indicated  (05/23/2009)    Tetanus booster: Not documented   Td booster deferral: Not indicated  (05/23/2009)    Pneumococcal vaccine: Not documented    H. zoster vaccine: Not documented   H. zoster vaccine deferral: Not indicated  (05/23/2009)  Colorectal Screening   Hemoccult: Not documented   Hemoccult action/deferral: Refused  (06/06/2009)    Colonoscopy: Not documented  Other Screening   PSA: Not documented   PSA action/deferral: Discussed-decision deferred  (06/06/2009)   Smoking status: never  (06/06/2009)  Lipids   Total Cholesterol: 162  (03/28/2008)   LDL: 91  (03/28/2008)   LDL Direct: Not documented   HDL: 48  (03/28/2008)   Triglycerides: 116  (03/28/2008)  Hypertension   Last Blood Pressure: 134 / 90  (06/06/2009)   Serum creatinine: 1.18  (06/05/2008)   Serum potassium 3.6  (06/05/2008)    Hypertension flowsheet reviewed?: Yes   Progress toward BP goal: Improved  Self-Management Support :   Personal Goals (by the next clinic visit) :      Personal blood pressure goal: 140/90  (05/23/2009)   Patient will work on the following items until the next clinic visit to reach self-care goals:     Medications and monitoring: take my medicines every day, bring all of my medications to every visit  (06/06/2009)     Eating: drink diet soda or water instead of juice or soda, eat foods that are low in  salt, eat baked foods instead of fried foods, eat fruit for snacks and desserts, limit or avoid alcohol  (06/06/2009)     Activity: take a 30 minute walk every day, take the stairs instead of the elevator  (06/06/2009)    Hypertension self-management support: Written self-care plan, Education handout, Resources for patients handout  (06/06/2009)   Hypertension self-care plan printed.   Hypertension education handout printed      Resource handout printed.

## 2010-04-03 NOTE — Progress Notes (Signed)
Summary: refill/gg  Phone Note Refill Request  on March 14, 2010 3:04 PM  Refills Requested: Medication #1:  VICODIN 5-500 MG TABS take one tab by mouth up to every 6 hours as needed for back pain   Last Refilled: 01/06/2010  Method Requested: Telephone to Pharmacy Initial call taken by: Merrie Roof RN,  March 14, 2010 3:05 PM  Follow-up for Phone Call        Refill approved-nurse to complete Follow-up by: Julaine Fusi  DO,  March 14, 2010 4:17 PM  Additional Follow-up for Phone Call Additional follow up Details #1::        Rx called to pharmacy Additional Follow-up by: Merrie Roof RN,  March 14, 2010 5:28 PM    Prescriptions: VICODIN 5-500 MG TABS (HYDROCODONE-ACETAMINOPHEN) take one tab by mouth up to every 6 hours as needed for back pain  #60 x 0   Entered and Authorized by:   Julaine Fusi  DO   Signed by:   Julaine Fusi  DO on 03/14/2010   Method used:   Telephoned to ...       [facility] [address] [phone]* (retail)       [address]       [place], [place]  [address]       Ph: [phone]       Fax: [phone]   RxID:   [id number]

## 2010-04-27 ENCOUNTER — Encounter: Payer: Self-pay | Admitting: Internal Medicine

## 2010-07-04 ENCOUNTER — Other Ambulatory Visit: Payer: Self-pay | Admitting: *Deleted

## 2010-07-04 MED ORDER — HYDROCODONE-ACETAMINOPHEN 5-500 MG PO TABS: 1.0000 | ORAL_TABLET | Freq: Four times a day (QID) | ORAL | Status: DC | PRN

## 2010-07-04 NOTE — Telephone Encounter (Signed)
Faxed to [facility].

## 2010-07-09 ENCOUNTER — Ambulatory Visit (INDEPENDENT_AMBULATORY_CARE_PROVIDER_SITE_OTHER): Payer: Medicare Other | Admitting: Internal Medicine

## 2010-07-09 ENCOUNTER — Encounter: Payer: Self-pay | Admitting: Internal Medicine

## 2010-07-09 VITALS — BP 145/90 | HR 77 | Temp 97.6°F | Ht 73.0 in | Wt 230.4 lb

## 2010-07-09 DIAGNOSIS — I10 Essential (primary) hypertension: Secondary | ICD-10-CM

## 2010-07-09 LAB — LIPID PANEL
HDL: 42 mg/dL (ref >39)
LDL Cholesterol: 131 mg/dL — ABNORMAL HIGH (ref 0–99)
Total CHOL/HDL Ratio: 4.4 Ratio
Triglycerides: 64 mg/dL (ref <150)
VLDL: 13 mg/dL (ref 0–40)

## 2010-07-09 LAB — BASIC METABOLIC PANEL
CO2: 27 mEq/L (ref 19–32)
Chloride: 98 mEq/L (ref 96–112)
Creat: 1.11 mg/dL (ref 0.40–1.50)
Potassium: 3.8 mEq/L (ref 3.5–5.3)
Sodium: 137 mEq/L (ref 135–145)

## 2010-07-09 MED ORDER — HYDROCHLOROTHIAZIDE 25 MG PO TABS: 25.0000 mg | ORAL_TABLET | Freq: Every day | ORAL | Status: DC

## 2010-07-09 MED ORDER — LISINOPRIL 40 MG PO TABS: 20.0000 mg | ORAL_TABLET | Freq: Every day | ORAL | Status: DC

## 2010-07-09 MED ORDER — AMLODIPINE BESYLATE 5 MG PO TABS: 10.0000 mg | ORAL_TABLET | Freq: Every day | ORAL | Status: DC

## 2010-07-18 NOTE — Consult Note (Signed)
NAME:  Jeremy Hill, Jeremy Hill                           ACCOUNT NO.:  [id number]   MEDICAL RECORD NO.:  [id number]                   PATIENT TYPE:  INP   LOCATION:  5001                                 FACILITY:  [facility]   PHYSICIAN:  Gabrielle Dare. Janee Morn, M.D.             DATE OF BIRTH:  01/23/1946   DATE OF CONSULTATION:  02/14/2002  DATE OF DISCHARGE:                                   CONSULTATION   REASON FOR CONSULTATION:  Inguinal lymphadenopathy.   HISTORY OF PRESENT ILLNESS:  The patient is a 65 year old African-American  male who has had about a six-week history of vertebral infection involving  T8 to T9 with T9 collapse, and the infection was cultured and shows group B  strep.  The patient was recently admitted with fever and neutropenia.  He  was also found to have some inguinal adenopathy, and I was consulted and  advised possible lymph node biopsy.   PAST MEDICAL HISTORY:  1. Hypertension.  2. 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  3. Psoriasis.  4. Anemia.   PAST SURGICAL HISTORY:  Right inguinal hernia repair at age 44.   MEDICATIONS:  Phenergan, morphine, Senokot, Thorazine, OxyContin,  hydrochlorothiazide, Zosyn.   SOCIAL HISTORY:  He does not smoke or use alcohol.  He does not use IV  drugs.  He is married with two children.   FAMILY HISTORY:  His mother passed during open heart surgery.   REVIEW OF SYSTEMS:  GENERAL:  He has some fatigue.  HEENT:  Negative.  CARDIOVASCULAR: Negative.  RESPIRATORY:  Cough productive of sputum.  GI:  Mild no acute distress.  MUSCULOSKELETAL: Back pain.  PSYCHIATRIC:  He has  some depression.  Otherwise Review of Systems is negative.   PHYSICAL EXAMINATION:  VITAL SIGNS:  Temperature 100.1, pulse 88,  respirations 18, blood pressure 156/84.  GENERAL:  Alert and oriented and in no acute distress.  NECK:  Supple without adenopathy.  LUNGS:  Clear to auscultation bilaterally.  HEART:  Regular.  ABDOMEN:  Soft, nontender, nondistended with no  palpable masses.  GU:  His scrotum has some excoriated areas medially and laterally.  EXTREMITIES:  He does have palpable 1.5 lymph node in his right groin and  approximately 1 cm in his left groin.  Both are soft and not fixed, although  they are deep.  On his right upper extremity, he has some psoriasis,  otherwise his extremities are warm.   LABORATORY DATA:  Sodium 136, potassium 3.2, chloride 100, CO2 30, BUN 12,  creatinine 0.9, glucose 106.  White blood cell count 2500, hemoglobin 9,  hematocrit 27.1, platelets 183.  LDH 135.  Sed rate 70.  C reactive protein  22.2.  LFTs are within normal limits.   ASSESSMENT:  This is a 65 year old African-American male with a group B  streptococcus vertebral infection, admitted with fever and neutropenia.  The  patient also has bilateral palpable inguinal  lymph nodes.   I discussed the possibility of biopsying these with the patient's primary  team in the a.m.  I did mention this to the patient.  If deemed necessary,  we will plan on scheduling this later this week.                                               Gabrielle Dare Janee Morn, M.D.    BET/MEDQ  D:  02/14/2002  T:  02/15/2002  Job:  [id number]

## 2010-07-18 NOTE — Consult Note (Signed)
NAME:  Jeremy Hill, Jeremy Hill NO.:  [id number]   MEDICAL RECORD NO.:  [id number]                   PATIENT TYPE:  INP   LOCATION:  5504                                 FACILITY:  [facility]   PHYSICIAN:  Payton Doughty, M.D.                   DATE OF BIRTH:  03-18-1945   DATE OF CONSULTATION:  01/17/2002  DATE OF DISCHARGE:                                   CONSULTATION   HISTORY OF PRESENT ILLNESS:  I was asked to see this 65 year old right-  handed black gentleman who a month ago was in the hospital with a history of  abdominal pain, adenopathy, and fever of unknown origin.  The cause was not  discovered at discharge home.  He was readmitted day before yesterday with  similar symptoms.  A CT of his abdomen demonstrated some suspicious areas in  the thoracic spine.  Followup MRI showed possible diskitis or osteomyelitis  with a small amount of epidural extension, and I was asked to visit with  him.  His history is remarkable for some psoriasis.  He also has anemia.  He  has lost some weight and has had some night sweats.   MEDICATIONS:  None.   SOCIAL HISTORY:  He is not a smoker.   PAST SURGICAL HISTORY:  He has no evidence of operations.   ALLERGIES:  None.   PHYSICAL EXAMINATION:  GENERAL:  General exam is deferred to the medicine  service.  NEUROLOGIC:  He is awake, has a little bit of a flat affect but is alert and  oriented x 3.  His pupils are equal, round, and reactive to light.  His  extraocular movements are intact.  Facial movement and sensation are intact.  Tongue is midline.  Shoulder shrug is normal.  He describes no swallowing  difficulties.  Motor exam demonstrates 5/5 strength throughout the upper and  lower extremities. There is no current sensory deficit.  Reflexes are 1  throughout the upper extremities, 2 at the knees, 2 at the ankles.  Toes  downgoing bilaterally.  Straight leg raise is negative.  He has minimal if  any tenderness to  percussion along his spine.   LABORATORY DATA:  MRI demonstrates T7-T8 probable diskitis with enhancement  of the disk space and adjacent vertebral body.  There is a small amount of  epidural enhancement with minimal if any cord compression.  The epidural  enhancement is directly adjacent to the disk space.   CLINICAL IMPRESSION:  Probable diskitis.  Other concerns would be lymphoma  because of the history of adenopathy.   PLAN:  He needs to have this biopsied percutaneously.  It can be done in the  disk space as well as the paraspinous tissues which also demonstrated  enhancement.  It should be sent for cultures including acid-fast bacterial.  Tissue should also be obtained at this  time for pathologic evaluation to  make sure he does not have the lymphoma.  His neurologic status is now  intact, and I would re-scan him only for change in his neurologic status.  Infectious disease is also going to visit with this gentleman, and I would  await their opinion as well.                                               Payton Doughty, M.D.   MWR/MEDQ  D:  01/17/2002  T:  01/17/2002  Job:  [id number]

## 2010-07-18 NOTE — Discharge Summary (Signed)
NAME:  Jeremy Hill, Jeremy Hill                           ACCOUNT NO.:  [id number]   MEDICAL RECORD NO.:  [id number]                   PATIENT TYPE:  INP   LOCATION:                                       FACILITY:  [facility]   PHYSICIAN:  Alvester Morin, M.D.               DATE OF BIRTH:  1946-02-14   DATE OF ADMISSION:  12/17/2001  DATE OF DISCHARGE:  12/24/2001                                 DISCHARGE SUMMARY   DISCHARGE MEDICATIONS:  1. HCTZ 25 daily.  2. Aspirin 81 mg daily.  3. Toradol 30 mg q.6 h. p.r.n.  4. Stool softener over-the-counter p.r.n.   DISCHARGE INSTRUCTIONS:  Will schedule clinic followup on a weekday.   DISCHARGE DIAGNOSES:  1. Symptom complex of abdominal pain, fever, increased sedimentation rate     and monocytosis, etiology unknown.  2. Normocytic anemia.  3. Hypertension.  4. Lateral wall hypokinesis.   BRIEF HISTORY:  Jeremy Hill is a 65 year old black male with no significant  past medical history.  On December 15, 2001, he had onset of tightness in the  rib cage.  On October 17, the patient went to the emergency room in  [place] and was diagnosed with HCTZ.  The patient's discomfort continued  and he presented to the ER at [facility].   HOSPITAL COURSE:  Problem #1.  Symptom complex of pain, fever, increased sed  rate and monocytosis:  The patient initially was thought to have chest  tightness and he was admitted to telemetry.  He had a CT scan that was  negative for PE.  Cardiac enzymes were cycled and were negative.  The  patient was treated with beta-blockers, aspirin, nitroglycerin, and full  dose Lovenox.  However, during his hospital course it became apparent that  his discomfort was not necessarily chest pain but more of a migratory  abdominal pain; therefore, his cardiac workup was halted.  However, he did  have a 2D echocardiogram that was a difficult study technically but had an  ejection fraction of 40-55% with moderate hypokinesis of the lateral  wall.  He had a CT of the abdomen and pelvis that was negative.  His hemoglobin was  negative.  GI was consulted.  We started him on antibiotics for atypical  diverticulitis.  The patient got much better in the hospital while on  Toradol.  Antibiotics were discontinued as it was felt to be a low  probability for diverticulitis.   Infectious Disease consult was obtained because the patient had persistent  fever and then elevated sed rate and the monocytosis.  The patient was  worked up for viral etiologies including EBV panel and CMV, HIV all of which  were negative.  The patient was symptomatically better and so he was able to  be discharged on December 24, 2001, with further outpatient workup to  determine the etiology of his symptoms.  Problem #2.  Normocytic anemia:  The patient had a hemoglobin level that was  12.5 on admission but trended down to 10.4 prior to discharge.  He had iron  studies that showed a ferritin level of 209, so this is not felt to be iron  deficiency.  His fecal occult blood was negative.  The patient will need a  colonoscopy as an outpatient and this will be followed up at his outpatient  clinic.   Problem #3.  Hypertension:  The patient was discharged on HCTZ 25 mg daily  and will be followed up as an outpatient.  His blood pressure was ranging  150/100 prior to discharge.   Problem #4.  Lateral wall hypokinesis seen on echocardiogram:  The patient  will need outpatient cardiology workup and this will be arranged at his  clinic.   Problem #5.  Psoriasis:  This is not addressed during his hospitalization.   DISPOSITION:  The patient was discharged to home in good condition.  An  outpatient clinic appointment will be made for him when the clinic is open  during the weekday.     Burns Spain, M.D.                Alvester Morin, M.D.    EAB/MEDQ  D:  08/02/2002  T:  08/02/2002  Job:  [id number]

## 2010-07-18 NOTE — Discharge Summary (Signed)
NAME:  Jeremy Hill, Jeremy Hill                           ACCOUNT NO.:  [id number]   MEDICAL RECORD NO.:  [id number]                   PATIENT TYPE:  INP   LOCATION:  5001                                 FACILITY:  [facility]   PHYSICIAN:  Alvester Morin, M.D.               DATE OF BIRTH:  [birth year]   DATE OF ADMISSION:  02/13/2002  DATE OF DISCHARGE:  02/19/2002                                 DISCHARGE SUMMARY   DISCHARGE DIAGNOSES:  1. Bronchitis.  2. Neutropenia.  3. Anemia.  4. 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  5. Compression fracture of T9.  6. Inguinal lymphadenopathy.   DISCHARGE MEDICATIONS:  1. Rocephin 2 g IV q.d.  2. Hydrochlorothiazide 25 mg p.o. q.d.  3. Colace 100 mg p.o. q.d.  4. OxyContin 20 mg p.o. q.12h.  5. Lexapro 10 p.o. q.d.   Please note that the Rocephin,  hydrochlorothiazide, Colace and OxyContin  are to be continued as before. A prescription  was given for Lexapro, which  is the only new  medication.   DISPOSITION/FOLLOWUP:  The patient is discharged home in stable condition.  He is to call Dr. Gabrielle Dare. Janee Morn at [facility] to make an  appointment for the first week of January.  Phone number for [facility] was supplied upon discharge. He is also instructed to call  the internal medicine outpatient clinic at [phone] to make the next  available appointment with Dr. Rogelia Boga.  The results of the patient's lymph  node biopsy will be followed up on as soon as they are available and he will  be notified of the results.  He supplied his home phone number which was  area code [phone].   PROCEDURE:  Biopsy of the right inguinal lymph node was performed by Violeta Gelinas with [facility] on 02/17/02.   CONSULTATIONS:  1. Gabrielle Dare. Janee Morn, M.D. with [facility].  2. Dr. Cliffton Asters with infectious disease.   ADMISSION HISTORY AND PHYSICAL EXAMINATION:  For full details of the  admission history and physical, please see the chart.  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  After his last hospitalization in November, he was discharged home on a six-  week course of IV Rocephin through a PIC line.  He presents to the hospital  on 02/14/02 with a one-day history of persistent productive cough with  subjective fevers and posttussive emesis.  He denies any recent weight loss,  dysuria, diarrhea or night sweats.   PHYSICAL EXAMINATION ON ADMISSION:  VITAL SIGNS:  Temperature 100.7, pulse  101, respirations 20, blood pressure  156/93.  O2 saturation 98% on room  air.  GENERAL:  During the exam, the patient was hiccuping.  HEENT:  Oropharynx was  benign with no thrush or pustules.  LUNGS:  Clear.  His heart was tachycardia.  However, regular rhythm.  ABDOMEN:  His abdomen was tender to deep palpation.  Positive bowel sounds.  No masses were palpable.  EXTREMITIES:  A PIC line was placed in the right upper extremity.  The site  was clean.  There was psoriasis in the antecubital fossa.  He had bilateral  inguinal lymphadenopathy, right greater than left.  Otherwise, exam was  normal.   Labs in the emergency room demonstrated white blood cell count 2.8,  hemoglobin 9.7, platelets 202,000 with MCV of 85.  BMP via the I-STAT showed  a sodium of 136, potassium 3.5, chloride 99, CO2 30, BUN 15, creatinine 1.0,  glucose 113.  PA and lateral chest x-ray demonstrated PIC line in place at  superior vena cava with a T9 compression fracture consistent with the 11/03  MRI.  There were no focal infiltrates on chest x-ray.  The patient was  admitted to the medical teaching service for evaluation and treatment of his  fever, anemia and neutropenia.   HOSPITAL COURSE:  Problem 1.  Fever.  Infectious disease consult was  obtained.  It was felt that the  patient's fever was secondary to an acute  bronchitis instead of failure of Rocephin therapy for treatment of his  Streptococcus group G, vertebral osteomyelitis  and diskitis.  Rocephin was  discontinued upon admission and Zosyn was started.  The patient's fever  resolved.  He will be discharged home after a six-day course of Zosyn to  continue the remainder of his six-week course of Rocephin via home health  nursing.  Of note, a sputum culture revealed normal oropharyngeal flora.  Urine culture  revealed multiple species with no uropathogen and blood  cultures are negative to date.   Problem 2.  Migratory  abdominal  pain.  The patient continued to complain  of migratory abdominal  pain and flank pain which did seem to subside  somewhat prior to discharge.  This was thought to be secondary to his T9  vertebral compression fracture and his pain was treated with OxyContin.   Problem 3.  Inguinal lymphadenopathy.  The patient had inguinal  lymphadenopathy, right greater than left.  This could be secondary to groin  psoriasis.  However, with his anemia and neutropenia it was felt that a  biopsy was prudent.  General surgery was consulted and a biopsy  of the  right inguinal lymph node was done on 02/17/02.  At the time of discharge,  results of that biopsy are pending.  We will follow up on those results when  they are available.   Problem 4.  Anemia.  The patient had a stable anemia throughout his  hospitalization.  There was no evidence of hemolysis at the time of  discharge.  There is no explanation as to the source of his anemia.  If his  lymph node biopsy is negative, then a bone marrow biopsy may be considered.   Problem 5.  Neutropenia.  The patient had a stable neutropenia throughout  his hospitalization.  As stated previously, if his lymph node biopsy is  negative, a bone marrow biopsy may be considered.   Problem 6.  Hypertension.  The patient was continued on  his Hydrochlorothiazide 25 mg p.o. q.d. for his hypertension.   Problem 7.  Hiccups.  The patient had a complaint of hiccups upon admission.  He was treated with Thorazine 10 mg p.o. t.i.d. p.r.n.  with resolution of  his hiccups.   Problem 8.  Depression.  The patient was started on Lexapro 10 mg p.o. q.d.  for depression.   DISCHARGE LABORATORY DATA:  At the time of dictation, labs for the day of  discharge are pending.  If there is any change to the dictation, addendum  will be made.  Last CBC was drawn 02/17/02 which revealed a white blood cell  count 2.8, hemoglobin 9.4, hematocrit 28.8, platelets 181,000.  BMP was  checked on 02/17/02 which demonstrated sodium 139, potassium 4.1, chloride  106, CO2 31, BUN 4, creatinine 1.0, glucose 100, calcium 9.1.  Acute  hepatitis panel was negative.  High sensitivity, C-reactive protein was  22.2.  A urinalysis was negative.  Urine culture revealed multiple species  with no uropathogens.  Blood cultures are negative to date.  Respiratory  culture revealed normal oropharyngeal flora.     Michel Harrow, M.D.                        Alvester Morin, M.D.    KB/MEDQ  D:  02/19/2002  T:  02/20/2002  Job:  [id number]   cc:   Blanch Media, M.D.  [facility]  [place], [place] [address]  Fax: [phone]   Gabrielle Dare. Janee Morn, M.D.  [facility]  [address] [place], [place] [address]  Fax: [phone]   Cliffton Asters, M.D.  [address] [place]  [place] [address]  Fax: [phone]

## 2010-07-18 NOTE — Discharge Summary (Signed)
NAME:  Jeremy Hill, Jeremy Hill                             ACCOUNT NO.:  [id number]   MEDICAL RECORD NO.:  [id number]                   PATIENT TYPE:  INP   LOCATION:  5504                                 FACILITY:  [facility]   PHYSICIAN:  Dineen Kid. Reche Dixon, M.D.               DATE OF BIRTH:  17-Jul-1945   DATE OF ADMISSION:  01/16/2002  DATE OF DISCHARGE:  01/23/2002                                 DISCHARGE SUMMARY   DISCHARGE DIAGNOSES:  1. 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  2. Normocytic anemia.  3. Psoriasis.  4. Inguinal lymphadenopathy.  5. Hypertension.   DISCHARGE MEDICATIONS:  1. Rocephin 2 g IV q.d. x6 weeks.  This will be administered by home health     nurses.  2. Hydrochlorothiazide 25 mg p.o. q.d.  3. Colace 100 mg p.o. b.i.d.  4. Effexor 75 mg p.o. q.d.  5. Potassium chloride 20 mg p.o. q.d.  6. Dulcolax 10 mg p.o. q.d.  7. OxyContin 20 mg  p.o. q.12h.  8. Tylenol 1000 mg q.6h. as needed for extra pain control.   DISPOSITION:  The patient is discharged to home in stable condition with  home health antibiotics arranged.   FOLLOW UP:  Follow up with Dr. Blanch Media in [facility] in three to four weeks.  They will call him with an appointment.   CONSULTATIONS:  1. Dr.  Channing Mutters, neurosurgery.  2. Dr. Roxan Hockey, infectious disease.  3. Dr. Virginia Rochester, gastroenterology.   PROCEDURES:  1. Insertion of a peripherally inserted central venous catheter on January 20, 2002.  2. Right T8-T9 perivertebral fine-needle aspiration on January 18, 2002.   HISTORY OF PRESENT ILLNESS:  The patient is a 65 year old male with no past  medical history who had recently been hospitalized in October for a fever of  unknown origin and abdominal pain of an unclear etiology.  The patient's  pain had begun in the left upper quadrant and gradually migrated to the left  lower quadrant, right lower quadrant and now is in the right upper quadrant.  Extensive  workup was negative.  The patient was discharged home for further  outpatient workup.  The patient returned to [facility]  stating that he cannot live like this anymore when talking about the pain.  He did not deny suicidal ideations and was admitted for pain management and  further workup of his abdominal pain.  Close questioning of the patient  revealed that the pain was actually radicular in origin rather than  abdominal and review of his prior CTs showed a question of a soft tissue  mass abutting T8-T9 vertebral bodies.  The patient underwent MRI which  showed a T8-T8 discitis.   HOSPITAL COURSE:  Problem 1.  VERTEBRAL OSTEOMYELITIS:  Culture of the disc  aspirate grew Group G Streptococcus.  The patient was  begun on Rocephin IV 2  g q.d.  Due to the long-term nature of this antibiotic, a PICC was placed  and home health nursing was set up.  The patient has tolerated his first few  doses of antibiotics very well.  He will need approximately six weeks of IV  antibiotic therapy after which he will need to be on oral penicillin for two  months.  He is to receive an MRI post therapy.  Due to the pain in his back  and stress of infected vertebrae, he was fitted for a TLSO brace.  The  patient should receive a CBC with differential and a CMET every week as he  undergoes this antibiotic therapy.   Problem 2.  NORMOCYTIC ANEMIA:  The patient had heme negative stools.  This  could be considered anemia secondary to his illness.  However, given the  patient's age of 54, it is felt that the patient will need a colonoscopy.  The patient was prepped for colonoscopy over the weekend, but on Sunday he  refused to go through with it at which point it was canceled.  The patient  will receive the colonoscopy at a future date.   Problem 3.  PSORIASIS:  This is fairly extensive.  The patient did get good  results with an unidentified cream previously when he had been treating  this.   However, he had stopped treatment due to insurance problems.  The  psoriasis is very extensive on his right arm, particularly at the  antecubital area and in his groin, particularly around the scrotum.  It is  felt that the psoriasis may be related to his inguinal lymphadenopathy.  The  patient should have this treated with Dovonex or some other antipsoriatic  medication.   Problem 4.  INGUINAL ADENOPATHY:  As mentioned above, this is probably  secondary to psoriasis; however, the possibility of an underlying disease  such as lymphoma should not be ruled out and the patient should be monitored  closely in the outpatient setting.   Problem 5.  HYPERTENSION:  The patient's blood pressure could be elevated  secondary to his pain, however, it is felt that he may have an element of  hypertension prior to this and he is continued on HCTZ 25 mg p.o. q.d.   The patient will be told to come in for his weekly CBC with differential and  CMET.  The patient will follow up with Dr. Rogelia Boga in three to four weeks.  The patient is discharged home in stable condition.  Home health nursing has  been set up.  The patient has an appropriate back brace.  He is afebrile.  His white blood cells are not elevated.  Of note, one out of two blood  cultures grew gram-positive rods, however, since the other one was negative  and the patient was clinically stable, it was felt that this was likely a  contaminant.   DISCHARGE LABORATORY DATA AND X-RAY FINDINGS:  Sodium 129, potassium 3.6,  chloride 95, bicarb 27, BUN 11, creatinine 1.0, calcium 9.2, glucose 103.  White blood cells 4.4, hemoglobin 10.3, hematocrit 31.0.     Oretha Ellis, M.D.                      Dineen Kid Reche Dixon, M.D.    BT/MEDQ  D:  01/23/2002  T:  01/24/2002  Job:  [id number]   cc:   Blanch Media, M.D.  [facility]  [place],  [place] [address]  Fax: [phone]   Georgiana Spinner, M.D.  [address] [place] [address] [place]   [place] [address]  Fax: [phone]   Payton Doughty, M.D.  [address]  [place]  [place] [address]  Fax: [phone]   Rockey Situ. Flavia Shipper., M.D.  [address]  [place]  [place] [address]  Fax: [phone]

## 2010-07-18 NOTE — Consult Note (Signed)
NAME:  Jeremy Hill, Jeremy Hill NO.:  [id number]   MEDICAL RECORD NO.:  [id number]                   PATIENT TYPE:  INP   LOCATION:  3707                                 FACILITY:  [facility]   PHYSICIAN:  Bernette Redbird, MD                  DATE OF BIRTH:  Sep 02, 1945   DATE OF CONSULTATION:  12/20/2001  DATE OF DISCHARGE:                                   CONSULTATION   Internal medicine teaching service asked me to see this 65 year old African-  American male because of nonspecific abdominal  pain.   HISTORY:  The patient was admitted to the hospital several days ago with a  nonspecific presentation which raised the question of pulmonary embolism.  His evaluation so far has been negative and the pain has somewhat migrated  down into the abdomen where it is rather diffuse.  It does include some  degree of the left lower quadrant although it does not clearly predominate  there.  A CT scan was negative for any obvious intraabdominal pathology so  assistance was requested in ascertaining the cause of patient's pain.   PAST MEDICAL HISTORY:  No known allergies.  Outpatient medications none (  most recently started on HCTZ.)   OPERATIONS:  Remote inguinal hernia repair.   MEDICAL ILLNESSES:  Psoriasis.  No cardiopulmonary disease, diabetes or  hypertension.   HABITS:  Nonsmoker, nondrinker.   FAMILY HISTORY:  Negative for colon cancer, ulcers, alcoholism.   SOCIAL HISTORY:  Married.  Wife is at bedside.   REVIEW OF SYSTEMS:  Negative for significant upper tract, lower tract or  cardiopulmonary symptoms at baseline.  In particular, it does not sound as  though there is any chronic problem with diarrhea or constipation.   PHYSICAL EXAMINATION:  GENERAL:  Exam pertinent for substantial abdominal  tympany.  VITALS:  Unremarkable.  HEENT:  No overt pallor or icterus.  CHEST:  Clear.  HEART:  Normal.  ABDOMEN:  Slightly distended with diffuse hollow tympany.   No organomegaly,  guarding, mass or tenderness appreciated.  RECTAL:  Shows a normal prostate.  No masses.  Light brown stool which is  sent to the lab for guaiac analysis.   LABORATORY DATA:  Pertinent for normal white count of 6200, borderline low  hemoglobin of 12.0 (normal 13 or higher.)  MCV of 88 and RDW of 12.1.   IMPRESSION:  Nonspecific abdominal  pain with quite of bit of tympany on  today's exam suggesting gas trap as a possible etiology for symptoms, which  are not clearly related to food as the provacative factor.    PLAN:  Expectant management.  Given the negative findings on CT scan and the  patient's rather nonspecific description of his symptoms, I would think that  the likelihood of specific organic disease, E. coli is quite low.  Bernette Redbird, MD    RB/MEDQ  D:  12/21/2001  T:  12/21/2001  Job:  [id number]

## 2010-07-18 NOTE — Op Note (Signed)
NAME:  Jeremy Hill, Jeremy Hill                           ACCOUNT NO.:  [id number]   MEDICAL RECORD NO.:  [id number]                   PATIENT TYPE:  AMB   LOCATION:  ENDO                                 FACILITY:  [facility]   PHYSICIAN:  Anselmo Rod, M.D.               DATE OF BIRTH:  1945/04/07   DATE OF PROCEDURE:  03/12/2003  DATE OF DISCHARGE:                                 OPERATIVE REPORT   PROCEDURE PERFORMED:  Screening colonoscopy.   ENDOSCOPIST:  Anselmo Rod, M.D.   INSTRUMENT USED:  Olympus videocolonoscope.   INDICATION FOR THE PROCEDURE:  A 65 year old African-American male  undergoing screening colonoscopy to rule out colonic polyps, masses, etc.   PREPROCEDURE PREPARATION:  Informed consent was procured from the patient.  The patient was fasted for eight hours prior to the procedure and prepped  with a bottle of magnesium citrate and a gallon of GoLYTELY the night prior  to the procedure.   PREPROCEDURE PHYSICAL:  VITAL SIGNS:  Stable.  NECK:  Supple.  CHEST:  Clear to auscultation.  S1 and S2 regular.  ABDOMEN:  Soft with normal bowel sounds.  No masses palpable.   DESCRIPTION OF THE PROCEDURE:  The patient was placed in the left lateral  decubitus position and sedated with 75 mg of Demerol and 7.5 mg of Versed in  slow incremental doses.  Once the patient was adequately sedated and  maintained on low-flow oxygen and continuous cardiac monitoring, the Olympus  videocolonoscope was advanced from the rectum to the cecum.  The appendiceal  orifice and ileocecal valve were clearly visualized and photographed.  No  masses, polyps, erosions, ulcerations, or diverticula were seen.  Small  intenal hemorrhoids were appreciated on retroflexion in the rectum.  The  terminal ileum appeared normal and without lesions.  The patient tolerated  the procedure well without complications.   IMPRESSION:  1. Normal colonoscopy to the terminal ileum except for small internal  hemorrhoids.  2.  Patchy depigmentation around the rectum.  2. Small internal hemorrhoids.  3. No masses, polyps, or diverticulosis seen.   RECOMMENDATIONS:  1. Repeat colorectal cancer screening is recommended in the next 10 years     unless th3 patient develops any     abnormal symptoms in the interim.  2. Continue high fiber diet with liberal fluid intake.  3. Outpatient followup as need arises in the future.                                               Anselmo Rod, M.D.    JNM/MEDQ  D:  03/12/2003  T:  03/12/2003  Job:  [id number]   cc:   Blanch Media, M.D.  [facility]  [place], [place] [address]  Fax: [phone]

## 2010-07-18 NOTE — Op Note (Signed)
   NAME:  Jeremy Hill, Jeremy Hill                           ACCOUNT NO.:  [id number]   MEDICAL RECORD NO.:  [id number]                   PATIENT TYPE:  INP   LOCATION:  5001                                 FACILITY:  [facility]   PHYSICIAN:  Gabrielle Dare. Janee Morn, M.D.             DATE OF BIRTH:  1945/03/20   DATE OF PROCEDURE:  02/17/2002  DATE OF DISCHARGE:                                 OPERATIVE REPORT   PREOPERATIVE DIAGNOSES:  1. Neutropenia.  2. Lymphadenopathy.   POSTOPERATIVE DIAGNOSES:  1. Neutropenia.  2. Lymphadenopathy.   PROCEDURE:  Right inguinal lymph node biopsy.   SURGEON:  Gabrielle Dare. Janee Morn, M.D.   ANESTHESIA:  MAC.   CLINICAL NOTE:  The patient is a 65 year old male with a six-week history of  a vertebral osteomyelitis in his thoracic spine.  He was readmitted for this  and fevers, was found to be neutropenic.  He has bilateral inguinal  lymphadenopathy, and a lymph node biopsy was requested as part of his  medical workup.   DESCRIPTION OF PROCEDURE:  The patient was brought to the operating room.  MAC anesthesia was administered.  He received intravenous antibiotics.  His  right groin and lower abdomen were prepped and draped in a sterile fashion.  Marcaine 0.5% with epinephrine was injected as local anesthetic,  approximately 16 cc were used.  A small vertical incision was made over the  largest palpable lymph node in his right groin.  Subcutaneous tissues were  dissected down, and the node was sharply dissected down to its stalk.  This  was clamped and suture ligated.  The node was removed and sent fresh to  pathology for evaluation.  Subsequently further inspection revealed no other  prominent lymph nodes in the area.  Bovie cautery was used to get excellent  hemostasis.  The area was copiously irrigated, checked again for bleeding,  controlled with Bovie cautery again, and subsequently the incision was  closed.  First the subcutaneous tissues were reapproximated with  simple  interrupted 3-0 Vicryl sutures, and the skin was closed with a running 4-0  Monocryl subcuticular stitch.  All sponge and needle counts were correct.  Benzoin and Steri-Strips and a sterile dressing were applied.  The patient  tolerated the procedure well without complications and was taken to the  recovery room in stable condition.                                               Gabrielle Dare Janee Morn, M.D.    BET/MEDQ  D:  02/17/2002  T:  02/18/2002  Job:  [id number]

## 2010-09-09 ENCOUNTER — Other Ambulatory Visit: Payer: Self-pay | Admitting: *Deleted

## 2010-09-09 MED ORDER — HYDROCODONE-ACETAMINOPHEN 5-500 MG PO TABS: 1.0000 | ORAL_TABLET | Freq: Four times a day (QID) | ORAL | Status: DC | PRN

## 2010-09-12 NOTE — Telephone Encounter (Signed)
Rx called in 

## 2010-09-17 ENCOUNTER — Telehealth: Payer: Self-pay | Admitting: *Deleted

## 2010-09-17 NOTE — Telephone Encounter (Signed)
Will sed form to be completed by Dr Reche Dixon due to qty #60 on Amlodipine 2 tablet ( 10mg  total) daily. Stanton Kidney Holly Pring RN 09/17/10 2:25PM

## 2010-10-02 ENCOUNTER — Encounter: Payer: Medicare Other | Admitting: Internal Medicine

## 2010-10-10 ENCOUNTER — Ambulatory Visit (INDEPENDENT_AMBULATORY_CARE_PROVIDER_SITE_OTHER): Payer: Medicare Other | Admitting: Internal Medicine

## 2010-10-10 ENCOUNTER — Encounter: Payer: Self-pay | Admitting: Internal Medicine

## 2010-10-10 DIAGNOSIS — I10 Essential (primary) hypertension: Secondary | ICD-10-CM

## 2010-10-10 DIAGNOSIS — M546 Pain in thoracic spine: Secondary | ICD-10-CM

## 2010-10-10 MED ORDER — HYDROCODONE-ACETAMINOPHEN 5-500 MG PO TABS: 1.0000 | ORAL_TABLET | Freq: Four times a day (QID) | ORAL | Status: AC | PRN

## 2010-10-13 ENCOUNTER — Encounter: Payer: Self-pay | Admitting: Internal Medicine

## 2010-10-13 NOTE — Assessment & Plan Note (Signed)
Well-controlled on current medication regimen. Patient is ambulatory and is able to perform activities of daily living with no difficulties. Will continue same medication regimen and will provide refill today.

## 2010-10-13 NOTE — Assessment & Plan Note (Signed)
Blood pressure is slightly above the goal.. This is most likely secondary to not taking medications for the past several days. We will provide refill today. Electrolytes were recently checked and were within normal limits. We will continue same medication regimen for now. I have encouraged patient to check his blood pressure regularly at home and if the numbers are consistently higher than 140/90 he should try to call clinic back. In addition I have encouraged compliance with medications as well as trying to call approximately week before his medications ran out to ensure a timely refill.

## 2010-10-13 NOTE — Progress Notes (Signed)
  Subjective:    Patient ID: Jeremy Hill, male    DOB: 10-31-1945, 65 y.o.   MRN: [id number]  HPI Patient is 65 year old male with past medical history outlined below presents to clinic for regular followup on blood pressure and cholesterol. He reports compliance with current medications as well as recommended diet and exercise. She mentions running out of his blood pressure medicine 3 days ago which as he says has not happened to him usually but he was away and was unable to get to [place] on time for refill. He denies recent sicknesses or hospitalizations, no episodes of chest pain or shortness of breath, no abdominal urinary concerns, no weakness or dizziness, no systemic symptoms of weight loss or night sweats.   Review of Systems Constitutional: Denies fever, chills, diaphoresis, appetite change and fatigue.  HEENT: Denies photophobia, eye pain, redness, hearing loss, ear pain, congestion, sore throat, rhinorrhea, sneezing, mouth sores, trouble swallowing, neck pain, neck stiffness and tinnitus.   Respiratory: Denies SOB, DOE, cough, chest tightness,  and wheezing.   Cardiovascular: Denies chest pain, palpitations and leg swelling.  Gastrointestinal: Denies nausea, vomiting, abdominal pain, diarrhea, constipation, blood in stool and abdominal distention.  Genitourinary: Denies dysuria, urgency, frequency, hematuria, flank pain and difficulty urinating.  Musculoskeletal: Denies myalgias, back pain, joint swelling, arthralgias and gait problem.  Skin: Denies pallor, rash and wound.  Neurological: Denies dizziness, seizures, syncope, weakness, light-headedness, numbness and headaches.  Hematological: Denies adenopathy. Easy bruising, personal or family bleeding history  Psychiatric/Behavioral: Denies suicidal ideation, mood changes, confusion, nervousness, sleep disturbance and agitation      Objective:   Physical Exam  Constitutional: Vital signs reviewed.  Patient is a well-developed and  well-nourished in no acute distress and cooperative with exam. Alert and oriented x3.  Cardiovascular: RRR, S1 normal, S2 normal, no MRG, pulses symmetric and intact bilaterally Pulmonary/Chest: CTAB, no wheezes, rales, or rhonchi Abdominal: Soft. Non-tender, non-distended, bowel sounds are normal, no masses, organomegaly, or guarding present.  Neurological: A&O x3, Strenght is normal and symmetric bilaterally, cranial nerve II-XII are grossly intact, no focal motor deficit, sensory intact to light touch bilaterally.  Skin: Warm, dry and intact. No rash, cyanosis, or clubbing.  Psychiatric: Normal mood and affect. speech and behavior is normal. Judgment and thought content normal. Cognition and memory are normal.         Assessment & Plan:

## 2010-11-18 ENCOUNTER — Ambulatory Visit (INDEPENDENT_AMBULATORY_CARE_PROVIDER_SITE_OTHER): Payer: Medicare Other | Admitting: Internal Medicine

## 2010-11-18 ENCOUNTER — Encounter: Payer: Medicare Other | Admitting: Internal Medicine

## 2010-11-18 ENCOUNTER — Encounter: Payer: Self-pay | Admitting: Internal Medicine

## 2010-11-18 DIAGNOSIS — I10 Essential (primary) hypertension: Secondary | ICD-10-CM

## 2010-11-18 DIAGNOSIS — M546 Pain in thoracic spine: Secondary | ICD-10-CM

## 2010-11-18 NOTE — Patient Instructions (Addendum)
Please make a follow up appointment with primary care doctor in 6-8 weeks. Please take all your medications regularly. If you have any trouble refilling Vicodin call the clinic.

## 2010-11-18 NOTE — Progress Notes (Signed)
  Subjective:    Patient ID: Jeremy Hill, male    DOB: May 06, 1945, 65 y.o.   MRN: [id number]  HPI Mr. Alfonzo Arca is a pleasant 65 year old man with past with history of anemia, hypertension comes to the clinic for regular followup visit. He denies any complaints in terms of fever, chills, nausea vomiting or abdominal pain, chest pain shortness of breath, diarrhea or headache, or recent weight loss. His back pain is better controlled with Vicodin. His blood pressure today is 153/94 and he says that he is working to lose weight. He wants his blood pressure to be less than 140. He originally came for establishment visit with his primary care doctor-which did not happen this time and so he will come back in 8-10 weeks and will discuss about his back pain management at that time along with blood pressure management.   Review of Systems    as per history of present illness, all other systems reviewed and negative. Objective:   Physical Exam Vitals: Reviewed and stable General: NAD HEENT: PERRL, EOMI, no scleral icterus Cardiac: RRR, no rubs, murmurs or gallops Pulm: clear to auscultation bilaterally, moving normal volumes of air Abd: soft, nontender, nondistended, BS present Ext: warm and well perfused, no pedal edema Neuro: alert and oriented X3, cranial nerves II-XII grossly intact.      Assessment & Plan:

## 2010-11-18 NOTE — Assessment & Plan Note (Addendum)
He is started on pain medicine contract is getting Vicodin 5/500 twice a day when necessary. He is well-controlled on it. He said that he was supposed to discuss his pain management with his primary doctor-which he will do during the next visit. He has a few refills on his Vicodin and was confused that we do called the pharmacy every month for the refill. I explained him that that is not the case and if anything happens the refill he can call the clinic and we can figure it out.

## 2010-11-18 NOTE — Assessment & Plan Note (Signed)
Blood pressure 153/94. Mildly elevated. Goal less than 140/90. He said that he is working on that and is trying to lose weight. He thought that he lost weight but reviewing his vitals with him apparently he gained about 16 pounds in last 3 years. So I encouraged him to follow more intense exercise in terms of walking at least 30 minutes a day for 5 days a week. He verbalized understanding and seems to be motivated for his blood pressure control. I would continue same medication for now and reassess the next visit with this conservative management along with current medications.

## 2010-12-04 ENCOUNTER — Other Ambulatory Visit: Payer: Self-pay | Admitting: *Deleted

## 2010-12-04 MED ORDER — HYDROCODONE-ACETAMINOPHEN 5-500 MG PO TABS: 1.0000 | ORAL_TABLET | Freq: Four times a day (QID) | ORAL | Status: DC | PRN

## 2010-12-04 NOTE — Telephone Encounter (Signed)
Last appt 11/18/10.

## 2010-12-04 NOTE — Telephone Encounter (Signed)
Vicodin rx called to [facility] pharmacy.

## 2010-12-17 LAB — DIFFERENTIAL
Basophils Absolute: 0
Basophils Relative: 1
Eosinophils Absolute: 0.2
Eosinophils Relative: 6 — ABNORMAL HIGH
Lymphocytes Relative: 31
Lymphs Abs: 0.9
Monocytes Absolute: 0.3
Monocytes Relative: 10
Neutro Abs: 1.6 — ABNORMAL LOW
Neutrophils Relative %: 52

## 2010-12-17 LAB — CBC
HCT: 37.2 — ABNORMAL LOW
Hemoglobin: 12.4 — ABNORMAL LOW
MCHC: 33.5
MCV: 89.3
Platelets: 200
RBC: 4.16 — ABNORMAL LOW
RDW: 12.6
WBC: 3 — ABNORMAL LOW

## 2010-12-17 LAB — I-STAT 8, (EC8 V) (CONVERTED LAB)
Chloride: 104
Glucose, Bld: 115 — ABNORMAL HIGH
Potassium: 3.5
pH, Ven: 7.372 — ABNORMAL HIGH

## 2010-12-17 LAB — POCT CARDIAC MARKERS
Operator id: 284141
Troponin i, poc: <0.05

## 2011-01-20 ENCOUNTER — Encounter: Payer: Medicare Other | Admitting: Internal Medicine

## 2011-02-03 ENCOUNTER — Other Ambulatory Visit: Payer: Self-pay | Admitting: Internal Medicine

## 2011-02-04 NOTE — Telephone Encounter (Signed)
Ordered by Dr, Sharman Crate in 2011 but was d/c 01/06/10 - course completed.

## 2011-02-04 NOTE — Telephone Encounter (Signed)
Not on med list.  Who prescribed this and for what??

## 2011-04-02 ENCOUNTER — Ambulatory Visit (INDEPENDENT_AMBULATORY_CARE_PROVIDER_SITE_OTHER): Payer: Medicare Other | Admitting: Internal Medicine

## 2011-04-02 ENCOUNTER — Encounter: Payer: Self-pay | Admitting: Internal Medicine

## 2011-04-02 DIAGNOSIS — Z0000 Encounter for general adult medical examination without abnormal findings: Secondary | ICD-10-CM | POA: Diagnosis not present

## 2011-04-02 DIAGNOSIS — I10 Essential (primary) hypertension: Secondary | ICD-10-CM | POA: Diagnosis not present

## 2011-04-02 DIAGNOSIS — M546 Pain in thoracic spine: Secondary | ICD-10-CM | POA: Diagnosis not present

## 2011-04-02 DIAGNOSIS — D649 Anemia, unspecified: Secondary | ICD-10-CM | POA: Diagnosis not present

## 2011-04-02 MED ORDER — HYDROCODONE-ACETAMINOPHEN 5-500 MG PO TABS: 1.0000 | ORAL_TABLET | Freq: Four times a day (QID) | ORAL | Status: DC | PRN

## 2011-04-02 MED ORDER — AMLODIPINE BESYLATE 10 MG PO TABS: 10.0000 mg | ORAL_TABLET | Freq: Every day | ORAL | Status: DC

## 2011-04-02 NOTE — Progress Notes (Signed)
Subjective:     Patient ID: Jeremy Hill, male   DOB: 1946-01-14, 66 y.o.   MRN: [id number]  HPI Pt is a very pleasant 66 yo M with a h/o HTN and thoracic diskitis/osteomyelitis in 2003 who presents for routine f/u.  Overall the pt is doing well, reporting a 10 lb intentional weight loss through diet and exercise recently.  He is taking amlodipine and HCTZ for BP, and a baby ASA.  He takes vicodin for LBP which is chronic, and he has had a pain contract for some time now.  He gets 60/month and sometimes has to use 2 tabs instead of 1 when his pain is bad on some days, but he makes it last.  Overall it seems he is doing well.  Review of Systems No CP, palp, SOB    Objective:   Physical Exam GEN: NAD.  Alert and oriented x 3.  Pleasant, conversant, and cooperative to exam. RESP:  CTAB, no w/r/r CARDIOVASCULAR: RRR, S1, S2, no m/r/g EXT: warm and dry. No edema in b/l LE     Assessment:         Plan:

## 2011-04-02 NOTE — Assessment & Plan Note (Signed)
Will check CBC as part of lab set at next visit in 8 weeks.  Does have a h/o normocytic anemia during a hospitalization in 2003 with a normal ferritin. - CBC at next visit - anemia panel if abnormal

## 2011-04-02 NOTE — Assessment & Plan Note (Signed)
BP still elevated today despite 10 lb weight loss and diet modifications.  Unclear about medicines he is taking, but as best I can tell he is taking amlodipine 5mg  and HCTZ 25mg .  I increased amplodipine to 10mg . - RTC 8 wks for BP recheck - asked to bring med bottles to ensure proper compliance - BMP to check electrolytes

## 2011-04-02 NOTE — Assessment & Plan Note (Signed)
-   colonoscopy due in 2015 - on ASA 81 - BP getting treated - will check lipids at next visit (last was fine) - check CBC, BMP at next visit for h/o anemia and general checkup - got flu shot this year - non smoker, non drinker

## 2011-04-02 NOTE — Assessment & Plan Note (Signed)
Pain decently controlled on vicodin 5/500 bid, sometimes more if he needs it.  Pt has been taking 2 tabs instead of 1 sometimes, but tries to make his 60/month last. After d/w Dr. Rogelia Boga we both agree taht increasing to #120 is reasonable to give the pt some breathing room in his dosing.  I explained it is fine if he has extras every month and that he does not have to fill the rx on the dot every month.  I just want him to be able to use two pills in a dose or 3-4 pills per day without worrying about running out.  We have a verbal contract for this. - I gave a written rx for vicodin 5/500 #120 with 3 refills today

## 2011-06-02 ENCOUNTER — Encounter: Payer: Medicare Other | Admitting: Internal Medicine

## 2011-06-18 ENCOUNTER — Encounter (HOSPITAL_COMMUNITY): Payer: Self-pay

## 2011-06-18 ENCOUNTER — Emergency Department (INDEPENDENT_AMBULATORY_CARE_PROVIDER_SITE_OTHER)
Admission: EM | Admit: 2011-06-18 | Discharge: 2011-06-18 | Disposition: A | Discharge status: Home / Self Care | Payer: Medicare Other | Source: Home / Self Care | Attending: Family Medicine | Admitting: Family Medicine

## 2011-06-18 DIAGNOSIS — M539 Dorsopathy, unspecified: Secondary | ICD-10-CM

## 2011-06-18 DIAGNOSIS — M62830 Muscle spasm of back: Secondary | ICD-10-CM

## 2011-06-18 MED ORDER — NAPROXEN 500 MG PO TABS: 500.0000 mg | ORAL_TABLET | Freq: Two times a day (BID) | ORAL | Status: DC

## 2011-06-18 MED ORDER — CYCLOBENZAPRINE HCL 5 MG PO TABS: 5.0000 mg | ORAL_TABLET | Freq: Three times a day (TID) | ORAL | Status: AC | PRN

## 2011-06-18 NOTE — ED Notes (Signed)
Pt c/o lower back pain for past 3-4 days.  Pt states he took 400mg  Ibuprofen on Tuesday with no relief.  Pt has HX of back pain.

## 2011-06-18 NOTE — ED Provider Notes (Signed)
History     CSN: [id number]  Arrival date & time 06/18/11  1047   First MD Initiated Contact with Patient 06/18/11 1152      Chief Complaint  Patient presents with  . Back Pain    (Consider location/radiation/quality/duration/timing/severity/associated sxs/prior treatment) HPI Comments: Jeremy Hill presents for evaluation of acute back pain over the last 3 days. He reports a chronic hx of back pain, managed with hydrocodone by his primary care provider. He denies any specific injury to cause this flare. He reports pain over the mid low back with pain on either side of the spine, described as a tightness. He denies any numbness, tingling, or weakness in his extremities.   Patient is a 66 y.o. male presenting with back pain. The history is provided by the patient.  Back Pain  This is a chronic problem. The current episode started more than 2 days ago. The problem occurs constantly. The problem has not changed since onset.The pain is associated with no known injury. The pain is present in the lumbar spine. The quality of the pain is described as aching. The pain does not radiate. The symptoms are aggravated by bending, twisting and certain positions. The pain is the same all the time. Pertinent negatives include no numbness, no bowel incontinence, no bladder incontinence, no leg pain, no paresthesias, no paresis, no tingling and no weakness. He has tried analgesics and NSAIDs for the symptoms.    Past Medical History  Diagnosis Date  . Hypertension   . Neutropenia 2008    noted on cbc (01/10/2007) WBC = 3.9, also CBC done 08/2006 showed WBC = 3.0, unclear etiology; CXR done 08/2006 showed questionable right lung nodule and the follow up CT was recommended, there is no data in the [place] that it was done  . Depression   . Osteomyelitis 12/2001    per MRI of the spine - GIVEN THE ABNORMALITY AT THE T8-9 LEVEL, INFECTION AT THESE LATTER REGIONS CANNOT BE COMPLETELY EXCLUDED AND  WILL NEED TO BE  FOLLOWED CLOSELY. SCATTERED DEGENERATIVE CHANGES IN THE LOWER  THORACIC/LUMBAR SPINE  AT THE L4-5 SIGNIFICANT NEURAL FORAMINAL NARROWING (R>L).  DECREASED SIGNAL INTENSITY OF BONE MARROW. UNDERLYING ANEMIA/INFILTRATIVE PROCESS/ LYMPHOMA.   . Osteomyelitis, chronic 2004    persistant osteomyelitis per MRI 04/2002 and also progressive at the same level as in 2003 T8-9 level  . Anemia 2008    borderline low Hg/Hct = 12.6/39.6 (01/10/2007), no anemia panel available and patient had refused colonoscopy, last colonoscopy done was in 2005 and the results were normal, done by Dr. Loreta Ave    Past Surgical History  Procedure Date  . 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 Heart disease Mother   . Heart disease Father   . Diabetes Sister   . Suicidality Brother     History  Substance Use Topics  . Smoking status: Never Smoker   . Smokeless tobacco: Not on file  . Alcohol Use: No      Review of Systems  Constitutional: Negative.   HENT: Negative.   Eyes: Negative.   Respiratory: Negative.   Cardiovascular: Negative.   Gastrointestinal: Negative.  Negative for bowel incontinence.  Genitourinary: Negative.  Negative for bladder incontinence.  Musculoskeletal: Positive for back pain.  Skin: Negative.  Neurological: Negative.  Negative for tingling, weakness, numbness and paresthesias.    Allergies  Review of patient's allergies indicates no known allergies.  Home Medications   Current Outpatient Rx  Name Route Sig Dispense Refill  . AMLODIPINE BESYLATE 10 MG PO TABS Oral Take 1 tablet (10 mg total) by mouth daily. 30 tablet 11  . ASPIRIN 81 MG PO TABS Oral Take 81 mg by mouth daily.      . CYCLOBENZAPRINE HCL 5 MG PO TABS Oral Take 1 tablet (5 mg  total) by mouth 3 (three) times daily as needed for muscle spasms (Take one tablet at night, and up to three times daily if not too drowsy; may take two tablets at a time if tolerated). 15 tablet 0  . HYDROCHLOROTHIAZIDE 25 MG PO TABS Oral Take 1 tablet (25 mg total) by mouth daily. 30 tablet 11  . HYDROCODONE-ACETAMINOPHEN 5-500 MG PO TABS Oral Take 1 tablet by mouth every 6 (six) hours as needed for pain. 1 tablet every 6 hours as needed for pain 120 tablet 3  . NAPROXEN 500 MG PO TABS Oral Take 1 tablet (500 mg total) by mouth 2 (two) times daily. 30 tablet 0    BP 159/93  Pulse 53  Temp(Src) 97.8 F (36.6 C) (Oral)  Resp 16  SpO2 97%  Physical Exam  Nursing note and vitals reviewed. Constitutional: He is oriented to person, place, and time. He appears well-developed and well-nourished.  HENT:  Head: Normocephalic and atraumatic.  Eyes: EOM are normal.  Neck: Normal range of motion.  Pulmonary/Chest: Effort normal.  Musculoskeletal: Normal range of motion.       Lumbar back: He exhibits tenderness and pain. He exhibits no bony tenderness.       Back: negative straight leg raise bilaterally, negative Fabers bilaterally, no pain with internal or external rotation bilaterally, full flexion, extension, abduction, and adduction; 5/5 strength; tenderness to palpation over the paraspinal muscles    Neurological: He is alert and oriented to person, place, and time.  Skin: Skin is warm and dry.  Psychiatric: His behavior is normal.    ED Course  Procedures (including critical care time)  Labs Reviewed - No data to display No results found.   1. Spasm of back muscles       MDM  Given prescription for cyclobenzaprine and naproxen        Renaee Munda, MD 06/18/11 [phone]

## 2011-06-18 NOTE — Discharge Instructions (Signed)
Take medications as directed; continue your regular back pain medication as well, and as directed. Use mild heat (heating pad, warm baths, etc) for 10 to 15 minutes, two to three times daily, as needed and as tolerated, taking care to not burn the skin. Begin stretches and exercises, as instructed in handouts, after 48 hours. Return to care should your symptoms not improve, or worsen in any way, such as numbness, weakness, or tingling, or inability to control urine or bowel movements.

## 2011-07-08 NOTE — Progress Notes (Signed)
The patient was seen by Dr. Talbot and not I.  He has left the practice and I am closing this encounter. 

## 2011-07-14 ENCOUNTER — Ambulatory Visit (INDEPENDENT_AMBULATORY_CARE_PROVIDER_SITE_OTHER): Payer: Medicare Other | Admitting: Internal Medicine

## 2011-07-14 ENCOUNTER — Encounter: Payer: Self-pay | Admitting: Internal Medicine

## 2011-07-14 VITALS — BP 128/85 | HR 83 | Temp 98.3°F | Ht 73.0 in | Wt 223.4 lb

## 2011-07-14 DIAGNOSIS — M546 Pain in thoracic spine: Secondary | ICD-10-CM

## 2011-07-14 DIAGNOSIS — D649 Anemia, unspecified: Secondary | ICD-10-CM

## 2011-07-14 DIAGNOSIS — I10 Essential (primary) hypertension: Secondary | ICD-10-CM

## 2011-07-14 DIAGNOSIS — Z0000 Encounter for general adult medical examination without abnormal findings: Secondary | ICD-10-CM

## 2011-07-14 DIAGNOSIS — E785 Hyperlipidemia, unspecified: Secondary | ICD-10-CM

## 2011-07-14 LAB — CBC
Hemoglobin: 13.8 g/dL (ref 13.0–17.0)
MCH: 28.7 pg (ref 26.0–34.0)
MCHC: 32.2 g/dL (ref 30.0–36.0)
MCV: 89.2 fL (ref 78.0–100.0)
Platelets: 238 10*3/uL (ref 150–400)
RBC: 4.81 MIL/uL (ref 4.22–5.81)
WBC: 4 10*3/uL (ref 4.0–10.5)

## 2011-07-14 NOTE — Progress Notes (Signed)
Subjective:     Patient ID: Jeremy Hill, male   DOB: 16-Sep-1945, 66 y.o.   MRN: [id number]  HPI Patient is a very pleasant 66 year old man with a history of hypertension and thoracic discitis/osteophytes in 2003 who presents for routine followup  Overall the patient is a small, though he said that over the Mother's Day holiday he was feeling down given his lack of local immediate family. He does have a son who works in [place] and a daughter who is at [employer]. He has some good support through his church.  He is recently comply with his blood pressure medications, though does miss one to 2 days per week.  Still taking Vicodin intermittently for thoracic back pain. He does get good relief. Was recently in urgent care for lower back pain after straining and during gardening, and Naprosyn and Vicodin have helped him. He does have occasional difficulty with his back when he is down on hands and knees when cleaning or when getting out of a car. He does not have worsening pain, however, just a locking sensation and occasional difficulty with movement.  He missed his last appointment with [place] because he continues to days and was sorry about the confusion.  Review of Systems     Objective:   Physical Exam Gen: NAD HEENT: NCAT, anicteric CV: RRR Chest: symmetric expansion, no respiratory distress    Assessment:         Plan:

## 2011-07-14 NOTE — Assessment & Plan Note (Addendum)
Currently doing well with PRN Vicodin. Does have occasional difficulty with his thoracic back when on his hands and knees during cleaning or with getting out of a car. He did not worsening pain. His back was last imaged with plain film in 2008 an MRI in 2006. He did go through a course of physical therapy many years ago. At this time I don't think he is worsening disability because of his back, but in the future we should consider referral to orthopedics, sports medicine or physical therapy if necessary. - Continue pain control - Continue to monitor function of back - Would probably consider referral prior to reimaging

## 2011-07-14 NOTE — Assessment & Plan Note (Signed)
Check CBC today, anemia panel if abnormal.

## 2011-07-14 NOTE — Assessment & Plan Note (Signed)
-   Colonoscopy due in 2015 - On baby aspirin - Lipid panel today - CBC/BMP today - Smoker, nondrinker

## 2011-07-14 NOTE — Assessment & Plan Note (Signed)
Patient has moderate compliance with HCTZ and amlodipine, but BP today was 128/85, which is at goal. - Continue HCTZ and amlodipine. - BMP today

## 2011-07-15 LAB — LIPID PANEL
Cholesterol: 173 mg/dL (ref 0–200)
HDL: 52 mg/dL (ref >39)
LDL Cholesterol: 111 mg/dL — ABNORMAL HIGH (ref 0–99)
Total CHOL/HDL Ratio: 3.3 Ratio
Triglycerides: 52 mg/dL (ref <150)

## 2011-07-15 LAB — BASIC METABOLIC PANEL WITH GFR
CO2: 30 mEq/L (ref 19–32)
Calcium: 10.1 mg/dL (ref 8.4–10.5)
Creat: 1.13 mg/dL (ref 0.50–1.35)
Glucose, Bld: 94 mg/dL (ref 70–99)

## 2011-07-21 ENCOUNTER — Other Ambulatory Visit: Payer: Self-pay | Admitting: *Deleted

## 2011-07-21 MED ORDER — HYDROCHLOROTHIAZIDE 25 MG PO TABS: 25.0000 mg | ORAL_TABLET | Freq: Every day | ORAL | Status: DC

## 2011-07-31 ENCOUNTER — Other Ambulatory Visit: Payer: Self-pay | Admitting: *Deleted

## 2011-07-31 DIAGNOSIS — M546 Pain in thoracic spine: Secondary | ICD-10-CM

## 2011-07-31 NOTE — Telephone Encounter (Signed)
Lat filled 06/28/11

## 2011-08-01 MED ORDER — HYDROCODONE-ACETAMINOPHEN 5-500 MG PO TABS: 1.0000 | ORAL_TABLET | Freq: Four times a day (QID) | ORAL | Status: DC | PRN

## 2011-08-04 NOTE — Telephone Encounter (Signed)
Rx called in 

## 2011-08-12 ENCOUNTER — Encounter: Payer: Medicare Other | Admitting: Internal Medicine

## 2011-10-28 ENCOUNTER — Other Ambulatory Visit: Payer: Self-pay | Admitting: *Deleted

## 2011-10-28 DIAGNOSIS — M546 Pain in thoracic spine: Secondary | ICD-10-CM

## 2011-10-28 MED ORDER — HYDROCODONE-ACETAMINOPHEN 5-500 MG PO TABS: 1.0000 | ORAL_TABLET | Freq: Four times a day (QID) | ORAL | Status: DC | PRN

## 2011-10-28 NOTE — Telephone Encounter (Signed)
Last filled 09/29/11

## 2011-10-28 NOTE — Telephone Encounter (Signed)
Rx faxed in.

## 2012-01-14 ENCOUNTER — Other Ambulatory Visit: Payer: Self-pay | Admitting: *Deleted

## 2012-01-14 DIAGNOSIS — M546 Pain in thoracic spine: Secondary | ICD-10-CM

## 2012-01-18 MED ORDER — HYDROCODONE-ACETAMINOPHEN 5-500 MG PO TABS: 1.0000 | ORAL_TABLET | Freq: Four times a day (QID) | ORAL | Status: DC | PRN

## 2012-01-18 NOTE — Telephone Encounter (Signed)
Vicodin rx called to [facility].

## 2012-01-18 NOTE — Telephone Encounter (Signed)
Patient has not been seen since May.  He will need an appointment prior to next refill (okay for [facility] resident) to get UDS and sign new pain contract for 120 pills.  Thanks!

## 2012-01-18 NOTE — Telephone Encounter (Signed)
Message sent to front desk to sched pt an appt. 

## 2012-02-26 ENCOUNTER — Telehealth: Payer: Self-pay | Admitting: *Deleted

## 2012-02-26 DIAGNOSIS — M546 Pain in thoracic spine: Secondary | ICD-10-CM

## 2012-02-26 MED ORDER — HYDROCODONE-ACETAMINOPHEN 5-325 MG PO TABS: 1.0000 | ORAL_TABLET | Freq: Four times a day (QID) | ORAL | Status: DC | PRN

## 2012-02-26 NOTE — Telephone Encounter (Addendum)
I changed dose as requested and refilled a 1 month supply - nurse to call in.  Please schedule a follow up appointment within 1 month.

## 2012-02-26 NOTE — Telephone Encounter (Signed)
Fax from [facility] - Vicodin 5/500mg  is no longer available. Please change rx to  Vicodin 5/325mg   Thanks

## 2012-02-29 NOTE — Telephone Encounter (Signed)
Hydrocodone 5/325mg  rx called to [facility].

## 2012-03-08 ENCOUNTER — Encounter: Payer: Medicare Other | Admitting: Internal Medicine

## 2012-03-15 ENCOUNTER — Encounter: Payer: Self-pay | Admitting: Internal Medicine

## 2012-03-15 ENCOUNTER — Ambulatory Visit (INDEPENDENT_AMBULATORY_CARE_PROVIDER_SITE_OTHER): Payer: Medicare Other | Admitting: Internal Medicine

## 2012-03-15 VITALS — BP 155/98 | HR 71 | Temp 98.0°F | Ht 73.0 in | Wt 225.1 lb

## 2012-03-15 DIAGNOSIS — Z0000 Encounter for general adult medical examination without abnormal findings: Secondary | ICD-10-CM | POA: Diagnosis not present

## 2012-03-15 DIAGNOSIS — Z23 Encounter for immunization: Secondary | ICD-10-CM | POA: Diagnosis not present

## 2012-03-15 DIAGNOSIS — M546 Pain in thoracic spine: Secondary | ICD-10-CM | POA: Diagnosis not present

## 2012-03-15 DIAGNOSIS — I10 Essential (primary) hypertension: Secondary | ICD-10-CM

## 2012-03-15 LAB — BASIC METABOLIC PANEL
BUN: 17 mg/dL (ref 6–23)
Calcium: 9.8 mg/dL (ref 8.4–10.5)
Glucose, Bld: 95 mg/dL (ref 70–99)

## 2012-03-15 MED ORDER — AMLODIPINE BESYLATE 10 MG PO TABS: 10.0000 mg | ORAL_TABLET | Freq: Every day | ORAL | Status: DC

## 2012-03-15 MED ORDER — LISINOPRIL 10 MG PO TABS: 10.0000 mg | ORAL_TABLET | Freq: Every day | ORAL | Status: DC

## 2012-03-15 MED ORDER — NAPROXEN 500 MG PO TABS: 500.0000 mg | ORAL_TABLET | Freq: Two times a day (BID) | ORAL | Status: DC | PRN

## 2012-03-15 MED ORDER — HYDROCODONE-ACETAMINOPHEN 5-325 MG PO TABS: 1.0000 | ORAL_TABLET | Freq: Four times a day (QID) | ORAL | Status: DC | PRN

## 2012-03-15 MED ORDER — HYDROCHLOROTHIAZIDE 25 MG PO TABS: 25.0000 mg | ORAL_TABLET | Freq: Every day | ORAL | Status: DC

## 2012-03-15 MED ORDER — ZOSTER VACCINE LIVE 19400 UNT/0.65ML ~~LOC~~ SOLR: 0.6500 mL | Freq: Once | SUBCUTANEOUS | Status: DC

## 2012-03-15 NOTE — Assessment & Plan Note (Signed)
BP Readings from Last 3 Encounters:  03/15/12 155/98  07/14/11 128/85  06/18/11 159/93    Lab Results  Component Value Date   NA 137 07/14/2011   K 3.5 07/14/2011   CREATININE 1.13 07/14/2011    Assessment:  Blood pressure control: mildly elevated  Progress toward BP goal:  deteriorated  Plan:  Medications:  continue current medications (amlodipine 10 & hctz 25) and start lisinopril 10 - patient to return in 1 week for blood work and in 1 month for BP follow up

## 2012-03-15 NOTE — Assessment & Plan Note (Signed)
No red flag symptoms to suggest concerning worsening.  Has tried PT in the past.    -New pain contract signed today for Vicodin 5-325, 1 tab q6h prn #120/month (with 3 refills) - script given to patient -Retry naproxen 500 q12h prn pain (patient does not remember if this was effective) -Return to clinic in 1 month, if no relief, consider pain mgmt referral

## 2012-03-15 NOTE — Patient Instructions (Signed)
General Instructions: -Please return in 1 month for blood pressure follow up  -You may start using naproxen every 12 hours as needed for your back pain - this prescription was called into your pharmacy  -Be sure to have your shingles vaccine done  -Today we gave you a flu & tetanus vaccine  Please be sure to bring all of your medications with you to every visit.  Should you have any new or worsening symptoms, please be sure to call the clinic at [phone].     Progress Toward Treatment Goals:  Treatment Goal 03/15/2012  Blood pressure deteriorated    Self Care Goals & Plans:  Self Care Goal 03/15/2012  Manage my medications take my medicines as prescribed; bring my medications to every visit; refill my medications on time; follow the sick day instructions if I am sick  Monitor my health keep track of my blood pressure; keep track of my weight  Eat healthy foods eat more vegetables; eat fruit for snacks and desserts; eat smaller portions; eat baked foods instead of fried foods; eat foods that are low in salt  Be physically active take a walk every day; find an activity I enjoy       Care Management & Community Referrals:  Referral 03/15/2012  Referrals made for care management support none needed

## 2012-03-15 NOTE — Progress Notes (Signed)
Subjective:   Patient ID: Jeremy Hill male   DOB: 13-Mar-1945 67 y.o.   MRN: [id number]  HPI: Jeremy Hill is a 67 y.o. pleasant gentleman with history of chronic back pain (history of thoracic and lumbar discitis) and hypertension who presents today for routine followup.  He requests refills for hydrochlorothiazide and amlodipine. He reports compliance with blood pressure medications. He also requests refill for Vicodin.  His primary concern today is his chronic back pain. He reports that he feels that the pain is worsening, but believes that this is because he is getting older. He describes the pain as throbbing, not sharp. At worst the pain is 7-8/10, at best it is a dull 1-2/10, but he reports that it is constant. He reports that pain is worse when climbing stairs or putting on shoes. He reports that he has no difference in pain with rest, heat, or cold. He denies numbness or tingling in his lower extremities. He denies urinary or bladder incontinence. He tells me that he takes Vicodin 5-325 one tablet every 6 hours, but generally takes it out 3 tablets in a day.   Past Medical History  Diagnosis Date  . Hypertension   . Neutropenia 2008    noted on cbc (01/10/2007) WBC = 3.9, also CBC done 08/2006 showed WBC = 3.0, unclear etiology; CXR done 08/2006 showed questionable right lung nodule and the follow up CT was recommended, there is no data in the [place] that it was done  . Depression   . Osteomyelitis 12/2001    per MRI of the spine - GIVEN THE ABNORMALITY AT THE T8-9 LEVEL, INFECTION AT THESE LATTER REGIONS CANNOT BE COMPLETELY EXCLUDED AND  WILL NEED TO BE FOLLOWED CLOSELY. SCATTERED DEGENERATIVE CHANGES IN THE LOWER  THORACIC/LUMBAR SPINE  AT THE L4-5 SIGNIFICANT NEURAL FORAMINAL NARROWING (R>L).  DECREASED SIGNAL INTENSITY OF BONE MARROW. UNDERLYING ANEMIA/INFILTRATIVE PROCESS/ LYMPHOMA.   . Osteomyelitis, chronic 2004    persistant osteomyelitis per MRI 04/2002 and also  progressive at the same level as in 2003 T8-9 level  . Anemia 2008    borderline low Hg/Hct = 12.6/39.6 (01/10/2007), no anemia panel available and patient had refused colonoscopy, last colonoscopy done was in 2005 and the results were normal, done by Dr. Loreta Ave   Current Outpatient Prescriptions  Medication Sig Dispense Refill  . amLODipine (NORVASC) 10 MG tablet Take 1 tablet (10 mg total) by mouth daily.  30 tablet  11  . aspirin 81 MG tablet Take 81 mg by mouth daily.        . hydrochlorothiazide (HYDRODIURIL) 25 MG tablet Take 1 tablet (25 mg total) by mouth daily.  30 tablet  11  . HYDROcodone-acetaminophen (NORCO/VICODIN) 5-325 MG per tablet Take 1 tablet by mouth every 6 (six) hours as needed for pain.  120 tablet  3  . naproxen (NAPROSYN) 500 MG tablet Take 1 tablet (500 mg total) by mouth 2 (two) times daily as needed.  60 tablet  2  . zoster vaccine live, PF, (ZOSTAVAX) 40981 UNT/0.65ML injection Inject 19,400 Units into the skin once.  1 each  0   Family History  Problem Relation Age of Onset  . Heart disease Mother   . Heart disease Father   . Diabetes Sister   . Suicidality Brother    History   Social History  . Marital Status: Married    Spouse Name: N/A    Number of Children: N/A  . Years of Education: N/A   Social  History Main Topics  . Smoking status: Never Smoker   . Smokeless tobacco: None  . Alcohol Use: No  . Drug Use: No  . Sexually Active: None   Other Topics Concern  . None   Social History Narrative  . None   Review of Systems: Constitutional: Denies fever, chills, diaphoresis, appetite change and fatigue.  HEENT: Denies photophobia, eye pain, redness, hearing loss, ear pain, congestion, sore throat, rhinorrhea, sneezing, mouth sores, trouble swallowing, neck pain, neck stiffness and tinnitus.   Respiratory: Denies SOB, DOE, cough, chest tightness,  and wheezing.   Cardiovascular: Denies chest pain, palpitations and leg swelling.    Gastrointestinal: Denies nausea, vomiting, abdominal pain, diarrhea, constipation, blood in stool and abdominal distention.  Genitourinary: Denies dysuria, urgency, frequency, hematuria, flank pain and difficulty urinating.  Musculoskeletal: Per history of present illness  Skin: Denies pallor, rash and wound.  Neurological: Denies dizziness, seizures, syncope, weakness, light-headedness, numbness and headaches.    Objective:  Physical Exam: Filed Vitals:   03/15/12 1542 03/15/12 1644  BP: 156/98 155/98  Pulse: 92 71  Temp: 98 F (36.7 C)   TempSrc: Oral   Height: 6\' 1"  (1.854 m)   Weight: 225 lb 1.6 oz (102.105 kg)   SpO2: 96%    Constitutional: Vital signs reviewed.  Patient is a well-developed and well-nourished man in no acute distress and cooperative with exam Head: Normocephalic and atraumatic Mouth: no erythema or exudates, MMM Eyes: PERRL, EOMI, conjunctivae normal, No scleral icterus.  Cardiovascular: RRR, S1 normal, S2 normal, no MRG, pulses symmetric and intact bilaterally Pulmonary/Chest: CTAB, no wheezes, rales, or rhonchi Abdominal: Soft. Non-tender, non-distended, bowel sounds are normal, no masses, organomegaly, or guarding present.  Musculoskeletal: Tenderness along thoracic spine, no erythema or gross deformities.  Neurological: A&O x3, Strength is normal and symmetric bilaterally, cranial nerve II-XII are grossly intact, no focal motor deficit, sensory intact to light touch bilaterally.  Skin: Warm, dry and intact. No rash, cyanosis, or clubbing.  Psychiatric: Normal mood and affect. speech and behavior is normal. Judgment and thought content normal. Cognition and memory are normal.   Assessment & Plan:  Case and care discussed with Dr. Rogelia Boga. Patient to return in one month for blood pressure and back pain followup. Please see problem-oriented charting for further details

## 2012-03-15 NOTE — Assessment & Plan Note (Signed)
-  Colonoscopy due in 2015 -Up to date with lipid panel (07/14/11) -Cont baby ASA -flu and tdap given today -Script for shingles vaccine given today

## 2012-04-18 ENCOUNTER — Encounter: Payer: Self-pay | Admitting: Internal Medicine

## 2012-04-18 ENCOUNTER — Ambulatory Visit (INDEPENDENT_AMBULATORY_CARE_PROVIDER_SITE_OTHER): Payer: Medicare Other | Admitting: Internal Medicine

## 2012-04-18 VITALS — BP 139/94 | HR 69 | Temp 97.3°F | Ht 73.0 in | Wt 224.8 lb

## 2012-04-18 DIAGNOSIS — Z0000 Encounter for general adult medical examination without abnormal findings: Secondary | ICD-10-CM | POA: Diagnosis not present

## 2012-04-18 DIAGNOSIS — I10 Essential (primary) hypertension: Secondary | ICD-10-CM

## 2012-04-18 DIAGNOSIS — M546 Pain in thoracic spine: Secondary | ICD-10-CM

## 2012-04-18 MED ORDER — LISINOPRIL 10 MG PO TABS: 10.0000 mg | ORAL_TABLET | Freq: Every day | ORAL | Status: DC

## 2012-04-18 MED ORDER — HYDROCODONE-ACETAMINOPHEN 10-325 MG PO TABS: 1.0000 | ORAL_TABLET | Freq: Three times a day (TID) | ORAL | Status: DC | PRN

## 2012-04-18 NOTE — Assessment & Plan Note (Signed)
Jeremy Hill has chronic back pain due to injury and subsequent infection/surgery to his thoracic spine.  He does not describe any concerning red flag symptoms and his pain is localized in the thoracic region.    Will increase Vicoden to 10/325 q8 hours, with #90 pills a month.    Jeremy Hill is to call the clinic and let me know if this helps.  If not improved, will refer to pain clinic and/or consider repeat MRI, though his pain does not seem new.  (Last MRI in 2006)

## 2012-04-18 NOTE — Patient Instructions (Addendum)
For your blood pressure -   Please check your blood pressure at least once a week.  If you have a blood pressure cuff at home, check it daily in the morning before having any breakfast.  Please keep a log of what your blood pressures are when you check them.  Please bring this log with you to each clinic visit.    Please schedule a follow up visit within the next 6 months.   For your medications:   Please bring all of your pill  Bottles with you to each visit.  This will help make sure that we have an up to date list of all the medications you are taking.  Please also bring any over the counter herbal medications you are taking (not including advil, tylenol, etc.)  Please continue taking your blood pressure medicines: amlodipine, hydrochlorothiazide, lisinopril  We will do a trial of an increased dose of pain medication with 10mg  of hydrocodone.  Please call the clinic if this does not improve your pain.  Please go to the [facility] and join a water aerobics class to help with your back pain as well.   Thank you!

## 2012-04-18 NOTE — Progress Notes (Signed)
  Subjective:    Patient ID: Jeremy Hill, male    DOB: 05/26/1945, 67 y.o.   MRN: [id number]  Follow up visit for elevated BP and back pain  HPI  Jeremy Hill is a 67yo man who presents today for follow up from a January appointment where he was evaluated for back pain and elevated blood pressure.   BP today is 157/83, which continues to be elevated.  Recheck was 139/94.  Due to the new parking situation, he had a difficult time finding parking and walking here, which explains the initial high pressure.  He had follow up labs after starting lisinopril which were normal.  He is taking HCTZ, lisinopril and amlodipine without issue.    [name] has a long history of back pain.  It began in 2003 when he was moving some furniture and wrenched his back.  After this occurred, he developed an infection in his back requiring antibiotics.  MRI's from that time reveal osteomyelitis/diskitis of T8-T9.  He had surgery on his spine as well.  Since that time he has had chronic back pain.  He has been to physical therapy with some improvement, however, he now requires pain medication to get through the day.  He was most recently prescribed vicoden 5/325, which takes care of the pain for about 4 hours.  He also took some of his Aunt's medication, which helped somewhat.  He thinks they were Vicoden 10's.  He has no reports on the [place] [facility] database.    He is not smoking or doing other illicit drugs.    His medications are updated in EPIC and include amlodipine, HCTZ, lisinopril, naproxen.  He has an Rx for the shingles vaccine but has not received it.    Review of Systems  Constitutional: Negative for fever, chills, activity change, appetite change and fatigue.  HENT: Negative for sore throat and trouble swallowing.   Eyes: Negative for redness and visual disturbance.  Respiratory: Negative for cough, choking and shortness of breath.   Cardiovascular: Negative for chest pain, palpitations and leg swelling.   Gastrointestinal: Negative for nausea, vomiting, abdominal pain and diarrhea.  Genitourinary: Negative for dysuria, frequency and difficulty urinating.  Musculoskeletal: Positive for back pain and arthralgias. Negative for joint swelling and gait problem.  Skin: Negative for rash and wound.  Neurological: Negative for dizziness, light-headedness and headaches.  Psychiatric/Behavioral: Negative for confusion and decreased concentration.       Objective:   Physical Exam  Constitutional: He is oriented to person, place, and time. He appears well-developed and well-nourished. No distress.  HENT:  Head: Normocephalic and atraumatic.  Eyes: Conjunctivae and EOM are normal. Pupils are equal, round, and reactive to light. No scleral icterus.  Cardiovascular: Normal rate, regular rhythm and normal heart sounds.   No murmur heard. Pulmonary/Chest: Effort normal and breath sounds normal. No respiratory distress.  Abdominal: Soft. Bowel sounds are normal.  Musculoskeletal: He exhibits no edema.  Neurological: He is alert and oriented to person, place, and time.  Skin: Skin is warm and dry.  + psoriatic patches to extensor and flexor surface of right forearm.   Psychiatric: He has a normal mood and affect. His behavior is normal.       Assessment & Plan:  As BP is better controlled, he can return in 6 months, earlier if needed.

## 2012-04-18 NOTE — Assessment & Plan Note (Signed)
DRE/PSA - will check at next visit, discuss PSA with patient Colonoscopy: 2005 - reported as negative.  Due in 2015 Flu/Tetanus UTD Zostavax pending

## 2012-04-18 NOTE — Assessment & Plan Note (Signed)
BP better controlled today.    Continue amlodipine, HCTZ, lisinopril.   BMET at next visit in 6 months.

## 2012-04-18 NOTE — Progress Notes (Signed)
Hydrocodone 10/325mg  rx called to [facility] - to start 05/13/12.

## 2012-06-09 ENCOUNTER — Other Ambulatory Visit: Payer: Self-pay | Admitting: *Deleted

## 2012-06-09 MED ORDER — HYDROCODONE-ACETAMINOPHEN 10-325 MG PO TABS: 1.0000 | ORAL_TABLET | Freq: Three times a day (TID) | ORAL | Status: DC | PRN

## 2012-06-09 NOTE — Telephone Encounter (Signed)
Refill approved - may be filled 30 days from last fill - nurse to call in.

## 2012-06-10 NOTE — Telephone Encounter (Signed)
Hydrocodone 10/325mg  rx called to [facility] pharmacy. Last refill was 05/13/12.

## 2012-06-14 ENCOUNTER — Encounter (HOSPITAL_COMMUNITY): Payer: Self-pay | Admitting: *Deleted

## 2012-06-14 ENCOUNTER — Emergency Department (HOSPITAL_COMMUNITY)
Admission: EM | Admit: 2012-06-14 | Discharge: 2012-06-14 | Disposition: A | Discharge status: Home / Self Care | Payer: Medicare Other | Attending: Emergency Medicine | Admitting: Emergency Medicine

## 2012-06-14 DIAGNOSIS — F329 Major depressive disorder, single episode, unspecified: Secondary | ICD-10-CM | POA: Insufficient documentation

## 2012-06-14 DIAGNOSIS — H538 Other visual disturbances: Secondary | ICD-10-CM | POA: Insufficient documentation

## 2012-06-14 DIAGNOSIS — Z79899 Other long term (current) drug therapy: Secondary | ICD-10-CM | POA: Diagnosis not present

## 2012-06-14 DIAGNOSIS — R6889 Other general symptoms and signs: Secondary | ICD-10-CM | POA: Insufficient documentation

## 2012-06-14 DIAGNOSIS — H5710 Ocular pain, unspecified eye: Secondary | ICD-10-CM | POA: Insufficient documentation

## 2012-06-14 DIAGNOSIS — I10 Essential (primary) hypertension: Secondary | ICD-10-CM | POA: Insufficient documentation

## 2012-06-14 DIAGNOSIS — Z8739 Personal history of other diseases of the musculoskeletal system and connective tissue: Secondary | ICD-10-CM | POA: Insufficient documentation

## 2012-06-14 DIAGNOSIS — Z7982 Long term (current) use of aspirin: Secondary | ICD-10-CM | POA: Diagnosis not present

## 2012-06-14 DIAGNOSIS — F3289 Other specified depressive episodes: Secondary | ICD-10-CM | POA: Insufficient documentation

## 2012-06-14 DIAGNOSIS — Z862 Personal history of diseases of the blood and blood-forming organs and certain disorders involving the immune mechanism: Secondary | ICD-10-CM | POA: Diagnosis not present

## 2012-06-14 DIAGNOSIS — H5711 Ocular pain, right eye: Secondary | ICD-10-CM

## 2012-06-14 MED ORDER — FLUORESCEIN SODIUM 1 MG OP STRP
1.0000 | ORAL_STRIP | Freq: Once | OPHTHALMIC | Status: AC
  Administered 2012-06-14: 1 via OPHTHALMIC
  Filled 2012-06-14: qty 1

## 2012-06-14 MED ORDER — TETRACAINE HCL 0.5 % OP SOLN
1.0000 [drp] | Freq: Once | OPHTHALMIC | Status: AC
  Administered 2012-06-14: 1 [drp] via OPHTHALMIC
  Filled 2012-06-14: qty 2

## 2012-06-14 NOTE — ED Notes (Signed)
The pt thinks that there is something in his rt eye since yesterday.it is painful and his vision is blurred

## 2012-06-14 NOTE — ED Provider Notes (Signed)
History    This chart was scribed for non-physician practitioner Francee Piccolo, PA-C working with Laray Anger, DO by Gerlean Ren, ED Scribe. This patient was seen in room TR06C/TR06C and the patient's care was started at 8:04 PM.    CSN: [id number]  Arrival date & time 06/14/12  1925   First MD Initiated Contact with Patient 06/14/12 1950      Chief Complaint  Patient presents with  . Eye Pain     The history is provided by the patient. No language interpreter was used.  Jeremy Hill is a 67 y.o. male who presents to the Emergency Department complaining of a foreign body sensation in the right eye causing painful discomfort and right eye blurred vision.  Pt does not wear contacts.  Pt denies splashing anything on face or remembering anything blowing into his eye. Denies exposure to chemicals that could have gotten in his eyes. No pain with eye movements.  Pt denies seeing floaters.  Pt has not used any eye drops.  Denies photophobia, visual disturbances, painful eye. Patient denies fevers, chills, nausea, vomiting, or diarrhea.    Past Medical History  Diagnosis Date  . Hypertension   . Neutropenia 2008    noted on cbc (01/10/2007) WBC = 3.9, also CBC done 08/2006 showed WBC = 3.0, unclear etiology; CXR done 08/2006 showed questionable right lung nodule and the follow up CT was recommended, there is no data in the [place] that it was done  . Depression   . Osteomyelitis 12/2001    per MRI of the spine - GIVEN THE ABNORMALITY AT THE T8-9 LEVEL, INFECTION AT THESE LATTER REGIONS CANNOT BE COMPLETELY EXCLUDED AND  WILL NEED TO BE FOLLOWED CLOSELY. SCATTERED DEGENERATIVE CHANGES IN THE LOWER  THORACIC/LUMBAR SPINE  AT THE L4-5 SIGNIFICANT NEURAL FORAMINAL NARROWING (R>L).  DECREASED SIGNAL INTENSITY OF BONE MARROW. UNDERLYING ANEMIA/INFILTRATIVE PROCESS/ LYMPHOMA.   . Osteomyelitis, chronic 2004    persistant osteomyelitis per MRI 04/2002 and also progressive at the same level as  in 2003 T8-9 level  . Anemia 2008    borderline low Hg/Hct = 12.6/39.6 (01/10/2007), no anemia panel available and patient had refused colonoscopy, last colonoscopy done was in 2005 and the results were normal, done by Dr. Loreta Ave    Past Surgical History  Procedure Laterality Date  . 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 Heart disease Mother   . Heart disease Father   . Diabetes Sister   . Suicidality Brother     History  Substance Use Topics  . Smoking status: Never Smoker   . Smokeless tobacco: Not on file  . Alcohol Use: No      Review of Systems  HENT: Negative for ear pain and sore throat.   Eyes: Positive for pain.       Positive foreign body sensation  Respiratory: Negative for shortness of breath.   Cardiovascular: Negative for chest pain.  All other systems reviewed and are negative.    Allergies  Review of patient's allergies indicates no known allergies.  Home Medications   Current Outpatient Rx  Name  Route  Sig  Dispense  Refill  . amLODipine (NORVASC) 10 MG tablet   Oral   Take 10 mg by mouth daily.         Marland Kitchen  aspirin 81 MG tablet   Oral   Take 81 mg by mouth daily.           . hydrochlorothiazide (HYDRODIURIL) 25 MG tablet   Oral   Take 25 mg by mouth daily.         Marland Kitchen HYDROcodone-acetaminophen (NORCO) 10-325 MG per tablet   Oral   Take 1 tablet by mouth every 8 (eight) hours as needed for pain.         Marland Kitchen lisinopril (PRINIVIL,ZESTRIL) 10 MG tablet   Oral   Take 10 mg by mouth daily.           BP 164/101  Pulse 62  Temp(Src) 97.8 F (36.6 C) (Oral)  Resp 16  SpO2 99%  Physical Exam  Nursing note and vitals reviewed. Constitutional: He is oriented to person, place, and time. He appears  well-developed and well-nourished. No distress.  HENT:  Head: Normocephalic and atraumatic.  Eyes: EOM are normal. Pupils are equal, round, and reactive to light. Right eye exhibits no chemosis, no exudate and no hordeolum. No foreign body present in the right eye. Left eye exhibits no chemosis, no exudate and no hordeolum. No foreign body present in the left eye. Right conjunctiva has no hemorrhage. Left conjunctiva is not injected. Left conjunctiva has no hemorrhage.  Right eye injected with minimal tearing No evidence of corneal abrasion or scratch with fluorescein stain and woods lamp.  Neck: Neck supple. No tracheal deviation present.  Cardiovascular: Normal rate.   Pulmonary/Chest: Effort normal. No respiratory distress.  Musculoskeletal: Normal range of motion.  Neurological: He is alert and oriented to person, place, and time.  Skin: Skin is warm and dry.  Psychiatric: He has a normal mood and affect. His behavior is normal.   VA: OD - 20/70 OS: 20/70 Both: 20/70  ED Course  Procedures (including critical care time) DIAGNOSTIC STUDIES: Oxygen Saturation is 99% on room air, normal by my interpretation.    COORDINATION OF CARE: 8:09 PM- Discussed fluorescein test with pt.  He understands and agrees with plan.    Wood's Lamp Examination negative for corneal abrasions and scratches. No foreign body observed.   Labs Reviewed - No data to display No results found.   1. Eye pain, right       MDM  The eye appears completely normal, no purulent discharge, not tender to palpation, no swelling of the lids or periorbital tissues. Conjunctiva is appear normal without any injection. Cornea intact to fluorescein staining. Normal funduscopic exam. PERRLA, full EOMs. Visual acuity was 20/70 in either eye and with both eyes open. The patient states that this is baseline for him. At this time there does not appear to be any evidence of an acute emergency medical condition and the patient  appears stable for discharge with appropriate outpatient follow up with PCP, also given name of ophthalmologist for follow up. Diagnosis was discussed with patient who verbalizes understanding and is agreeable to discharge. Patient given return precautions. Patient is stable at time of discharge       I personally performed the services described in this documentation, which was scribed in my presence. The recorded information has been reviewed and is accurate.       Jeannetta Ellis, PA-C 06/14/12 2354

## 2012-06-16 NOTE — ED Provider Notes (Signed)
Medical screening examination/treatment/procedure(s) were performed by non-physician practitioner and as supervising physician I was immediately available for consultation/collaboration.   Laray Anger, DO 06/16/12 1517

## 2012-07-05 ENCOUNTER — Other Ambulatory Visit: Payer: Self-pay | Admitting: *Deleted

## 2012-07-05 MED ORDER — LISINOPRIL 10 MG PO TABS: 10.0000 mg | ORAL_TABLET | Freq: Every day | ORAL | Status: DC

## 2012-07-09 ENCOUNTER — Other Ambulatory Visit: Payer: Self-pay | Admitting: Internal Medicine

## 2012-07-11 NOTE — Telephone Encounter (Signed)
Rx called in to pharmacy. 

## 2012-08-08 ENCOUNTER — Other Ambulatory Visit: Payer: Self-pay | Admitting: Internal Medicine

## 2012-08-10 ENCOUNTER — Other Ambulatory Visit: Payer: Self-pay | Admitting: Internal Medicine

## 2012-08-10 NOTE — Telephone Encounter (Signed)
Called to pharm 

## 2012-09-08 ENCOUNTER — Other Ambulatory Visit: Payer: Self-pay | Admitting: Internal Medicine

## 2012-09-08 NOTE — Telephone Encounter (Signed)
Can we get him an appointment in August or September with me?   Thank you!

## 2012-09-12 ENCOUNTER — Other Ambulatory Visit: Payer: Self-pay | Admitting: Internal Medicine

## 2012-09-12 NOTE — Telephone Encounter (Signed)
I just refilled this on 09/08/12. Could you please call it in?   Thank you

## 2012-09-13 ENCOUNTER — Other Ambulatory Visit: Payer: Self-pay | Admitting: Internal Medicine

## 2012-09-13 NOTE — Telephone Encounter (Signed)
This was refilled on 09/08/12.  This is the second time it has come back to me after filling it; is there a problem with the script?   Please advise why it keeps coming back.   Thanks  EBM

## 2012-09-16 NOTE — Telephone Encounter (Signed)
Rx called in to pharmacy - they had no record of being called in to pharmacy 09/08/12. Pt aware med will be ready 1 hour. Stanton Kidney Brylie Sneath RN 09/16/12 3PM

## 2012-10-11 ENCOUNTER — Other Ambulatory Visit: Payer: Self-pay | Admitting: Internal Medicine

## 2012-10-11 NOTE — Telephone Encounter (Signed)
Rx called in to pharmacy. 

## 2012-10-17 ENCOUNTER — Ambulatory Visit: Payer: Medicare Other | Admitting: Internal Medicine

## 2012-11-08 ENCOUNTER — Other Ambulatory Visit: Payer: Self-pay | Admitting: Internal Medicine

## 2012-11-09 ENCOUNTER — Other Ambulatory Visit: Payer: Self-pay | Admitting: Internal Medicine

## 2012-11-09 NOTE — Telephone Encounter (Signed)
Called to pharm 

## 2012-11-10 NOTE — Telephone Encounter (Signed)
Norco rx called to [facility].

## 2012-11-10 NOTE — Telephone Encounter (Signed)
Has appt Dr Criselda Peaches 10/13 so will give one refill in case so hat he doesn't run out before appt

## 2012-12-12 ENCOUNTER — Ambulatory Visit (INDEPENDENT_AMBULATORY_CARE_PROVIDER_SITE_OTHER): Payer: Medicare Other | Admitting: Internal Medicine

## 2012-12-12 ENCOUNTER — Encounter: Payer: Self-pay | Admitting: Internal Medicine

## 2012-12-12 VITALS — BP 181/104 | HR 59 | Temp 97.6°F | Wt 221.1 lb

## 2012-12-12 DIAGNOSIS — I10 Essential (primary) hypertension: Secondary | ICD-10-CM | POA: Diagnosis not present

## 2012-12-12 DIAGNOSIS — L408 Other psoriasis: Secondary | ICD-10-CM | POA: Diagnosis not present

## 2012-12-12 DIAGNOSIS — M546 Pain in thoracic spine: Secondary | ICD-10-CM

## 2012-12-12 DIAGNOSIS — Z23 Encounter for immunization: Secondary | ICD-10-CM

## 2012-12-12 DIAGNOSIS — L409 Psoriasis, unspecified: Secondary | ICD-10-CM | POA: Insufficient documentation

## 2012-12-12 DIAGNOSIS — Z0000 Encounter for general adult medical examination without abnormal findings: Secondary | ICD-10-CM

## 2012-12-12 LAB — BASIC METABOLIC PANEL WITH GFR
Calcium: 9.6 mg/dL (ref 8.4–10.5)
GFR, Est African American: 87 mL/min
GFR, Est Non African American: 76 mL/min
Sodium: 138 mEq/L (ref 135–145)

## 2012-12-12 MED ORDER — HYDROCHLOROTHIAZIDE 25 MG PO TABS: 25.0000 mg | ORAL_TABLET | Freq: Every day | ORAL | Status: DC

## 2012-12-12 MED ORDER — AMLODIPINE BESYLATE 10 MG PO TABS: 10.0000 mg | ORAL_TABLET | Freq: Every day | ORAL | Status: DC

## 2012-12-12 MED ORDER — LISINOPRIL 10 MG PO TABS: 10.0000 mg | ORAL_TABLET | Freq: Every day | ORAL | Status: DC

## 2012-12-12 MED ORDER — HYDROCODONE-ACETAMINOPHEN 10-325 MG PO TABS: 1.0000 | ORAL_TABLET | Freq: Three times a day (TID) | ORAL | Status: DC | PRN

## 2012-12-12 NOTE — Progress Notes (Signed)
  Subjective:    Patient ID: Jeremy Hill, male    DOB: [birth year], 67 y.o.   MRN: [id number]  CC: Routine follow up.   HPI  Got lost on walk here, long walk.  BP today elevated, he reports not taking amlodipine for "a while."  Otherwise, taking his medications.     Back pain - - Had a flare recently after walking in the yard and stepping in a hole.  The pain improved after being "laid up" for 2 days.  He is currently well controlled on current medications and not having any issues.    Reports nocturia X 3 per night, otherwise, no issues with urination, no hematuria.    He has psoriasis for which he sees his dermatologist twice a year.   We discussed PSA and cancer screening.  He has no family members with prostate cancer.  He is due for colon cancer screening next year (2015).   We reviewed his medications.  He is not taking amlodipine  Current Outpatient Prescriptions on File Prior to Visit  Medication Sig Dispense Refill  . aspirin 81 MG tablet Take 81 mg by mouth daily.        . hydrochlorothiazide (HYDRODIURIL) 25 MG tablet Take 25 mg by mouth daily.      Marland Kitchen HYDROcodone-acetaminophen (NORCO) 10-325 MG per tablet TAKE ONE TABLET BY MOUTH EVERY 8 HOURS AS NEEDED FOR PAIN  90 tablet  1  . lisinopril (PRINIVIL,ZESTRIL) 10 MG tablet Take 1 tablet (10 mg total) by mouth daily.  90 tablet  1   He is not smoking.    Review of Systems  Constitutional: Negative for fever, chills and activity change.  HENT: Negative for ear discharge and ear pain.   Eyes: Negative for pain, redness and visual disturbance.  Respiratory: Negative for cough, chest tightness and shortness of breath.   Cardiovascular: Negative for chest pain and leg swelling.  Gastrointestinal: Negative for abdominal pain, diarrhea and constipation.  Genitourinary: Negative for dysuria, hematuria and difficulty urinating.  Musculoskeletal: Positive for back pain. Negative for arthralgias and myalgias.  Skin: Positive for  rash (has psoriasis, uses OTC medications).  Neurological: Negative for dizziness, light-headedness and headaches.  Psychiatric/Behavioral: Negative for confusion and decreased concentration.       Objective:   Physical Exam  Vitals reviewed. Constitutional: He is oriented to person, place, and time. He appears well-developed and well-nourished. No distress.  HENT:  Head: Normocephalic and atraumatic.  Mouth/Throat: Oropharynx is clear and moist. No oropharyngeal exudate.  Eyes: Conjunctivae are normal. Pupils are equal, round, and reactive to light. No scleral icterus.  Neck: Normal range of motion. Neck supple. No thyromegaly present.  Cardiovascular: Normal rate, regular rhythm and normal heart sounds.   No murmur heard. Pulmonary/Chest: Effort normal and breath sounds normal. No respiratory distress. He has no wheezes.  Abdominal: Soft. Bowel sounds are normal.  Musculoskeletal: He exhibits no edema and no tenderness.  Neurological: He is alert and oriented to person, place, and time.  Skin: Skin is warm and dry. Rash (plaque psoriasis on arms) noted. No erythema.  Psychiatric: He has a normal mood and affect. His behavior is normal.  Recheck BP 181/104   BMP today      Assessment & Plan:  RTC in 2-4 weeks with [facility] resident for BP, then with me in 6 months.

## 2012-12-12 NOTE — Patient Instructions (Signed)
General Instructions:  Please schedule a follow up visit within the next 2-4  Weeks with an MD to check your blood pressure, then in 6 months with me (Dr. Criselda Peaches).   For your medications:   Please bring all of your pill  Bottles with you to each visit.  This will help make sure that we have an up to date list of all the medications you are taking.  Please also bring any over the counter herbal medications you are taking (not including advil, tylenol, etc.)  Please continue taking lisinopril, aspirin and hydrochlorothiazide along with your pain medications.  Please start taking Amlodpine 10mg  daily.   For your blood pressure -   Please check your blood pressure at least once a week.  If you have a blood pressure cuff at home, check it daily in the morning before having any breakfast.  Please keep a log of what your blood pressures are when you check them.  Please bring this log with you to each clinic visit.     Thank you!    Treatment Goals:  Goals (1 Years of Data) as of 12/12/12   None      Progress Toward Treatment Goals:  Treatment Goal 12/12/2012  Blood pressure deteriorated    Self Care Goals & Plans:  Self Care Goal 12/12/2012  Manage my medications take my medicines as prescribed; bring my medications to every visit; refill my medications on time  Monitor my health keep track of my blood pressure; bring my blood pressure log to each visit  Eat healthy foods eat foods that are low in salt; eat baked foods instead of fried foods  Be physically active find an activity I enjoy    No flowsheet data found.   Care Management & Community Referrals:  Referral 12/12/2012  Referrals made for care management support none needed

## 2012-12-12 NOTE — Assessment & Plan Note (Signed)
Follows closely with Dermatologist.

## 2012-12-12 NOTE — Assessment & Plan Note (Addendum)
BP Readings from Last 3 Encounters:  12/12/12 181/104  06/14/12 172/115  04/18/12 139/94    Lab Results  Component Value Date   NA 137 03/15/2012   K 3.6 03/15/2012   CREATININE 1.33 03/15/2012    Assessment: Blood pressure control: moderately elevated Progress toward BP goal:  deteriorated Comments: Not taking amlodipine as on med list  Plan: Medications:  continue current medications, add back amlodipine, check BMET today Educational resources provided: brochure Self management tools provided: home blood pressure logbook Other plans: Gave BP log, he will check once per week, check BMET today for renal function  Update: Renal function stable.

## 2012-12-12 NOTE — Assessment & Plan Note (Signed)
Stable on current regimen, no red flags

## 2012-12-12 NOTE — Assessment & Plan Note (Signed)
DRE/PSA - Discussed, will defer PSA at this time.  Colonoscopy: 2005 - reported as negative.  Due in 2015 Flu today Tetanus UTD

## 2012-12-13 ENCOUNTER — Encounter: Payer: Self-pay | Admitting: Internal Medicine

## 2012-12-26 ENCOUNTER — Encounter: Payer: Self-pay | Admitting: Internal Medicine

## 2012-12-26 ENCOUNTER — Ambulatory Visit (INDEPENDENT_AMBULATORY_CARE_PROVIDER_SITE_OTHER): Payer: Medicare Other | Admitting: Internal Medicine

## 2012-12-26 VITALS — BP 129/85 | HR 78 | Temp 97.4°F | Ht 73.0 in | Wt 221.8 lb

## 2012-12-26 DIAGNOSIS — I10 Essential (primary) hypertension: Secondary | ICD-10-CM | POA: Diagnosis not present

## 2012-12-26 MED ORDER — HYDROCODONE-ACETAMINOPHEN 10-325 MG PO TABS: 1.0000 | ORAL_TABLET | Freq: Three times a day (TID) | ORAL | Status: DC | PRN

## 2012-12-26 NOTE — Patient Instructions (Signed)
General Instructions: Your blood pressure is well-controlled today, good job!  Please return for a routine check-up with Dr. Criselda Peaches in about 6 months   Treatment Goals:  Goals (1 Years of Data) as of 12/26/12   None      Progress Toward Treatment Goals:  Treatment Goal 12/26/2012  Blood pressure at goal    Self Care Goals & Plans:  Self Care Goal 12/12/2012  Manage my medications take my medicines as prescribed; bring my medications to every visit; refill my medications on time  Monitor my health keep track of my blood pressure; bring my blood pressure log to each visit  Eat healthy foods eat foods that are low in salt; eat baked foods instead of fried foods  Be physically active find an activity I enjoy    No flowsheet data found.   Care Management & Community Referrals:  Referral 12/26/2012  Referrals made for care management support none needed

## 2012-12-26 NOTE — Progress Notes (Signed)
HPI The patient is a 67 y.o. male with a history of HTN, psoriasis, presenting for a BP recheck.  At the patient's last visit, BP was elevated to 181/104, due to running out of amlodipine.  The patient's BP is very well-controlled today, after re-starting his amlodipine.  The patient has been checking his BP at a local drug store, and notes that it has been well-controlled.  The patient has a history of chronic pain, and has a pain contract with our clinic.  He requests refill of his hydrocodone today, in accordance with this contract.  ROS: General: no fevers, chills, changes in weight, changes in appetite Skin: no rash HEENT: no blurry vision, hearing changes, sore throat Pulm: no dyspnea, coughing, wheezing CV: no chest pain, palpitations, shortness of breath Abd: no abdominal pain, nausea/vomiting, diarrhea/constipation GU: no dysuria, hematuria, polyuria Ext: no arthralgias, myalgias Neuro: no weakness, numbness, or tingling  Filed Vitals:   12/26/12 1322  BP: 129/85  Pulse: 78  Temp: 97.4 F (36.3 C)    PEX General: alert, cooperative, and in no apparent distress HEENT: pupils equal round and reactive to light, vision grossly intact, oropharynx clear and non-erythematous  Neck: supple Lungs: clear to ascultation bilaterally, normal work of respiration, no wheezes, rales, ronchi Heart: regular rate and rhythm, no murmurs, gallops, or rubs Abdomen: soft, non-tender, non-distended, normal bowel sounds Extremities: no cyanosis, clubbing, or edema Neurologic: alert & oriented X3, cranial nerves II-XII intact, strength grossly intact, sensation intact to light touch  Current Outpatient Prescriptions on File Prior to Visit  Medication Sig Dispense Refill  . amLODipine (NORVASC) 10 MG tablet Take 1 tablet (10 mg total) by mouth daily.  30 tablet  6  . aspirin 81 MG tablet Take 81 mg by mouth daily.        . hydrochlorothiazide (HYDRODIURIL) 25 MG tablet Take 1 tablet (25 mg  total) by mouth daily.  90 tablet  3  . HYDROcodone-acetaminophen (NORCO) 10-325 MG per tablet Take 1 tablet by mouth every 8 (eight) hours as needed for pain.  90 tablet  0  . lisinopril (PRINIVIL,ZESTRIL) 10 MG tablet Take 1 tablet (10 mg total) by mouth daily.  90 tablet  3   No current facility-administered medications on file prior to visit.    Assessment/Plan

## 2012-12-26 NOTE — Assessment & Plan Note (Signed)
BP Readings from Last 3 Encounters:  12/26/12 129/85  12/12/12 181/104  06/14/12 172/115    Lab Results  Component Value Date   NA 138 12/12/2012   K 3.9 12/12/2012   CREATININE 1.04 12/12/2012    Assessment: Blood pressure control: controlled Progress toward BP goal:  at goal Comments: BP is well-controlled today, now that he is back on Amlodipine.  Plan: Medications:  continue current medications Educational resources provided:   Self management tools provided:   Other plans: Recheck at next visit

## 2012-12-28 NOTE — Progress Notes (Signed)
Case discussed with Dr. Brown at the time of the visit.  We reviewed the resident's history and exam and pertinent patient test results.  I agree with the assessment, diagnosis, and plan of care documented in the resident's note. 

## 2013-04-11 ENCOUNTER — Other Ambulatory Visit: Payer: Self-pay | Admitting: *Deleted

## 2013-04-12 ENCOUNTER — Other Ambulatory Visit: Payer: Self-pay | Admitting: Internal Medicine

## 2013-04-12 MED ORDER — LISINOPRIL 10 MG PO TABS: 10.0000 mg | ORAL_TABLET | Freq: Every day | ORAL | Status: DC

## 2013-04-12 MED ORDER — HYDROCODONE-ACETAMINOPHEN 10-325 MG PO TABS: 1.0000 | ORAL_TABLET | Freq: Three times a day (TID) | ORAL | Status: DC | PRN

## 2013-04-12 MED ORDER — HYDROCHLOROTHIAZIDE 25 MG PO TABS: 25.0000 mg | ORAL_TABLET | Freq: Every day | ORAL | Status: DC

## 2013-04-12 MED ORDER — AMLODIPINE BESYLATE 10 MG PO TABS: 10.0000 mg | ORAL_TABLET | Freq: Every day | ORAL | Status: DC

## 2013-04-12 NOTE — Telephone Encounter (Signed)
Pt informed Rx is ready 

## 2013-05-10 ENCOUNTER — Other Ambulatory Visit: Payer: Self-pay | Admitting: *Deleted

## 2013-05-10 NOTE — Telephone Encounter (Signed)
Jeremy Hill - -   I am going to have to do this tomorrow, but it will be done!  Thanks

## 2013-05-11 MED ORDER — HYDROCODONE-ACETAMINOPHEN 10-325 MG PO TABS: 1.0000 | ORAL_TABLET | Freq: Three times a day (TID) | ORAL | Status: DC | PRN

## 2013-05-11 NOTE — Telephone Encounter (Signed)
Called pt and lm for a rtc

## 2013-06-12 ENCOUNTER — Other Ambulatory Visit: Payer: Self-pay | Admitting: *Deleted

## 2013-06-12 ENCOUNTER — Other Ambulatory Visit: Payer: Self-pay | Admitting: Internal Medicine

## 2013-06-12 MED ORDER — HYDROCODONE-ACETAMINOPHEN 10-325 MG PO TABS: 1.0000 | ORAL_TABLET | Freq: Three times a day (TID) | ORAL | Status: DC | PRN

## 2013-08-08 ENCOUNTER — Other Ambulatory Visit: Payer: Self-pay | Admitting: *Deleted

## 2013-08-10 MED ORDER — HYDROCODONE-ACETAMINOPHEN 10-325 MG PO TABS: 1.0000 | ORAL_TABLET | Freq: Three times a day (TID) | ORAL | Status: DC | PRN

## 2013-08-10 NOTE — Telephone Encounter (Signed)
Rx ready - pt called/informed. 

## 2013-09-07 ENCOUNTER — Other Ambulatory Visit: Payer: Self-pay | Admitting: *Deleted

## 2013-09-07 MED ORDER — HYDROCODONE-ACETAMINOPHEN 10-325 MG PO TABS: 1.0000 | ORAL_TABLET | Freq: Three times a day (TID) | ORAL | Status: DC | PRN

## 2013-09-07 NOTE — Telephone Encounter (Signed)
Rx ready - pt called. 

## 2013-10-10 ENCOUNTER — Other Ambulatory Visit: Payer: Self-pay | Admitting: *Deleted

## 2013-10-10 MED ORDER — HYDROCODONE-ACETAMINOPHEN 10-325 MG PO TABS: 1.0000 | ORAL_TABLET | Freq: Three times a day (TID) | ORAL | Status: DC | PRN

## 2013-10-10 NOTE — Telephone Encounter (Signed)
3 possibly?

## 2013-10-10 NOTE — Telephone Encounter (Signed)
Refilled X 1.  Will defer to his PCP, Dr. Daryll Drown, on providing additional months ahead of time if appropriate.

## 2013-11-07 ENCOUNTER — Other Ambulatory Visit: Payer: Self-pay | Admitting: *Deleted

## 2013-11-08 MED ORDER — HYDROCODONE-ACETAMINOPHEN 10-325 MG PO TABS: 1.0000 | ORAL_TABLET | Freq: Three times a day (TID) | ORAL | Status: DC | PRN

## 2013-11-09 NOTE — Telephone Encounter (Signed)
Pt.notified

## 2013-12-11 ENCOUNTER — Other Ambulatory Visit: Payer: Self-pay | Admitting: *Deleted

## 2013-12-11 MED ORDER — HYDROCODONE-ACETAMINOPHEN 10-325 MG PO TABS: 1.0000 | ORAL_TABLET | Freq: Three times a day (TID) | ORAL | Status: DC | PRN

## 2013-12-11 NOTE — Telephone Encounter (Signed)
Rx ready - pt called and stated he will schedule an appt when he picks up the rx.

## 2013-12-11 NOTE — Telephone Encounter (Signed)
Needs appointment prior to further refills.  Will refill X 1 until he can be seen.  Please call and have him schedule an appointment, needs to be with me as he has not been seen in 1 year.

## 2014-01-05 ENCOUNTER — Other Ambulatory Visit: Payer: Self-pay | Admitting: *Deleted

## 2014-01-09 MED ORDER — AMLODIPINE BESYLATE 10 MG PO TABS: 10.0000 mg | ORAL_TABLET | Freq: Every day | ORAL | Status: DC

## 2014-01-09 MED ORDER — LISINOPRIL 10 MG PO TABS: 10.0000 mg | ORAL_TABLET | Freq: Every day | ORAL | Status: DC

## 2014-01-09 MED ORDER — HYDROCHLOROTHIAZIDE 25 MG PO TABS: 25.0000 mg | ORAL_TABLET | Freq: Every day | ORAL | Status: DC

## 2014-01-11 ENCOUNTER — Other Ambulatory Visit: Payer: Self-pay | Admitting: Internal Medicine

## 2014-01-11 ENCOUNTER — Other Ambulatory Visit: Payer: Self-pay | Admitting: *Deleted

## 2014-01-11 MED ORDER — HYDROCODONE-ACETAMINOPHEN 10-325 MG PO TABS: 1.0000 | ORAL_TABLET | Freq: Three times a day (TID) | ORAL | Status: DC | PRN

## 2014-01-24 ENCOUNTER — Ambulatory Visit (INDEPENDENT_AMBULATORY_CARE_PROVIDER_SITE_OTHER): Payer: Medicare Other | Admitting: Internal Medicine

## 2014-01-24 ENCOUNTER — Encounter: Payer: Self-pay | Admitting: Internal Medicine

## 2014-01-24 ENCOUNTER — Ambulatory Visit (INDEPENDENT_AMBULATORY_CARE_PROVIDER_SITE_OTHER): Payer: Medicare Other | Admitting: *Deleted

## 2014-01-24 VITALS — BP 134/93 | HR 64 | Temp 98.1°F | Ht 73.0 in | Wt 212.9 lb

## 2014-01-24 DIAGNOSIS — M546 Pain in thoracic spine: Secondary | ICD-10-CM

## 2014-01-24 DIAGNOSIS — Z0000 Encounter for general adult medical examination without abnormal findings: Secondary | ICD-10-CM | POA: Diagnosis not present

## 2014-01-24 DIAGNOSIS — Z23 Encounter for immunization: Secondary | ICD-10-CM | POA: Diagnosis not present

## 2014-01-24 DIAGNOSIS — L409 Psoriasis, unspecified: Secondary | ICD-10-CM | POA: Diagnosis not present

## 2014-01-24 DIAGNOSIS — I10 Essential (primary) hypertension: Secondary | ICD-10-CM | POA: Diagnosis not present

## 2014-01-24 LAB — BASIC METABOLIC PANEL WITH GFR
BUN: 12 mg/dL (ref 6–23)
CHLORIDE: 103 meq/L (ref 96–112)
CO2: 30 mEq/L (ref 19–32)
CREATININE: 0.99 mg/dL (ref 0.50–1.35)
Calcium: 8.8 mg/dL (ref 8.4–10.5)
GFR, EST NON AFRICAN AMERICAN: 80 mL/min
GFR, Est African American: >89 mL/min
GLUCOSE: 102 mg/dL — AB (ref 70–99)
POTASSIUM: 3.8 meq/L (ref 3.5–5.3)
Sodium: 140 mEq/L (ref 135–145)

## 2014-01-24 LAB — LIPID PANEL
CHOL/HDL RATIO: 3.1 ratio
Cholesterol: 156 mg/dL (ref 0–200)
HDL: 51 mg/dL (ref >39)
LDL CALC: 96 mg/dL (ref 0–99)
Triglycerides: 44 mg/dL (ref <150)
VLDL: 9 mg/dL (ref 0–40)

## 2014-01-24 NOTE — Assessment & Plan Note (Signed)
Follows regularly with dermatology.

## 2014-01-24 NOTE — Assessment & Plan Note (Signed)
DRE/PSA - discussed, defer Colonoscopy: due, referral placed Flu: today Tetanus: UTD PNA: discuss at next visit.  Zostavax: Discuss at next visit.

## 2014-01-24 NOTE — Assessment & Plan Note (Signed)
Chronic, but stable.  Doing well on current pain management regimen.  No change today.

## 2014-01-24 NOTE — Patient Instructions (Signed)
General Instructions: Please schedule a follow up visit within the next 6 months.   For your medications:   Please bring all of your pill  Bottles with you to each visit.  This will help make sure that we have an up to date list of all the medications you are taking.  Please also bring any over the counter herbal medications you are taking (not including advil, tylenol, etc.)  Please continue taking all of your medications as prescribed.  Thank you!     Treatment Goals:  Goals (1 Years of Data) as of 01/24/14    None      Progress Toward Treatment Goals:  Treatment Goal 01/24/2014  Blood pressure at goal    Self Care Goals & Plans:  Self Care Goal 01/24/2014  Manage my medications take my medicines as prescribed; refill my medications on time  Monitor my health -  Eat healthy foods eat more vegetables; eat foods that are low in salt; eat baked foods instead of fried foods  Be physically active take a walk every day; find an activity I enjoy    No flowsheet data found.   Care Management & Community Referrals:  Referral 12/26/2012  Referrals made for care management support none needed

## 2014-01-24 NOTE — Progress Notes (Signed)
Subjective:    Patient ID: Jeremy Hill, male    DOB: [birth year], 68 y.o.   MRN: [id number]  CC: Routine follow up  HPI  [name] is a 68yo man with PMH of HTN, psoriasis, chronic low back pain.   [name] reports that he is doing well.  He reports having occasional back pain which is controlled with vicoden.  He reports that he is taking his blood pressure medication as prescribed.  He has occasional heart burn, but only when he eats too fast or eats fast food.  He denies any headaches, dizziness, lightheadedness, fever, chills, chest pain, change in stool or urinary habits.  He has a history of psoriasis which is improving now that it is winter.  Sees a dermatologist.   [name] seemed somewhat down during our visit and he noted that his birthday was tomorrow and that he is "alone" now.  On further questioning, since I last saw him, he has been divorced and his ex-wife is causing him legal troubles related to some land he inherited from his grandmother.  Her had 2 stepchildren with whom he was close, but now seems to be estranged from.  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   He is a never smoker, no other tobacco, no ETOH.  Father was an alcoholic.   We reviewed his medications.  Current Outpatient Prescriptions on File Prior to Visit  Medication Sig Dispense Refill  . amLODipine (NORVASC) 10 MG tablet Take 1 tablet (10 mg total) by mouth daily. 90 tablet 3  . aspirin 81 MG tablet Take 81 mg by mouth daily.      . hydrochlorothiazide (HYDRODIURIL) 25 MG tablet Take 1 tablet (25 mg total) by mouth daily. 90 tablet 3  . HYDROcodone-acetaminophen (NORCO) 10-325 MG per tablet Take 1 tablet by mouth every 8 (eight) hours as needed. 90 tablet 0  . lisinopril (PRINIVIL,ZESTRIL) 10 MG tablet Take  1 tablet (10 mg total) by mouth daily. 90 tablet 3   No current facility-administered medications on file prior to visit.     Review of Systems  Constitutional: Negative.   HENT: Negative for dental problem, ear discharge and ear pain.   Eyes: Negative for photophobia and visual disturbance.  Respiratory: Negative for cough, choking and shortness of breath.   Cardiovascular: Negative for chest pain, palpitations and leg swelling.  Gastrointestinal: Negative for nausea, diarrhea, constipation and blood in stool.       Acid reflux after rushing to eat, fast food  Genitourinary: Negative for frequency, flank pain and difficulty urinating.       Nocturia 2 X per night  Musculoskeletal: Positive for back pain. Negative for arthralgias and gait problem.  Skin: Positive for rash (psoriasis, chronic). Negative for pallor and wound.  Neurological: Negative for dizziness, light-headedness, numbness and headaches.  Psychiatric/Behavioral: Positive for dysphoric mood (mild today, but notes that he "always comes out on top"). Negative for confusion and decreased concentration.       Objective:   Physical Exam  Constitutional: He is oriented to person, place, and time. He appears well-developed and well-nourished. No distress.  HENT:  Head: Normocephalic and atraumatic.  Mouth/Throat: No oropharyngeal exudate.  Eyes: Pupils are equal, round, and reactive to light. No scleral  icterus.  Neck: Normal range of motion. Neck supple.  Cardiovascular: Normal rate, regular rhythm and normal heart sounds.   No murmur heard. Pulmonary/Chest: Effort normal and breath sounds normal. No respiratory distress. He has no wheezes.  Abdominal: Soft. Bowel sounds are normal.  Lymphadenopathy:    He has no cervical adenopathy.  Neurological: He is alert and oriented to person, place, and time. No cranial nerve deficit.  Skin: Skin is warm and dry.  Long sleeve shirt, did not see psoriasis on hands.     Psychiatric: He has a normal mood and affect. His behavior is normal.  Vitals reviewed.         Assessment & Plan:  RTC in 6 months, sooner if needed

## 2014-01-24 NOTE — Assessment & Plan Note (Addendum)
BP Readings from Last 3 Encounters:  01/24/14 134/93  12/26/12 129/85  12/12/12 181/104    Lab Results  Component Value Date   NA 138 12/12/2012   K 3.9 12/12/2012   CREATININE 1.04 12/12/2012    Assessment: Blood pressure control: controlled Progress toward BP goal:  at goal Comments: taking medications without issue  Plan: Medications:  continue current medications, amlodipine, hctz, lisinopril Educational resources provided:   Self management tools provided:   Other plans: check bmet today.  LDL was previously 111, which is likely within goal for this patient.  Check lipid profile.

## 2014-04-11 ENCOUNTER — Other Ambulatory Visit: Payer: Self-pay | Admitting: *Deleted

## 2014-04-11 MED ORDER — HYDROCODONE-ACETAMINOPHEN 10-325 MG PO TABS: 1.0000 | ORAL_TABLET | Freq: Three times a day (TID) | ORAL | Status: DC | PRN

## 2014-04-11 NOTE — Telephone Encounter (Signed)
Pt informed

## 2014-04-25 ENCOUNTER — Other Ambulatory Visit: Payer: Self-pay | Admitting: *Deleted

## 2014-04-25 NOTE — Telephone Encounter (Signed)
No med fill request at this time

## 2014-04-26 ENCOUNTER — Encounter: Payer: Medicare Other | Admitting: Internal Medicine

## 2014-05-10 NOTE — Addendum Note (Signed)
Addended by: Hulan Fray on: 05/10/2014 04:49 PM   Modules accepted: Orders

## 2014-06-04 ENCOUNTER — Telehealth: Payer: Self-pay | Admitting: *Deleted

## 2014-06-04 ENCOUNTER — Encounter: Payer: Self-pay | Admitting: Internal Medicine

## 2014-06-04 ENCOUNTER — Ambulatory Visit (INDEPENDENT_AMBULATORY_CARE_PROVIDER_SITE_OTHER): Payer: Medicare Other | Admitting: Internal Medicine

## 2014-06-04 VITALS — BP 141/81 | HR 79 | Temp 97.8°F | Wt 212.7 lb

## 2014-06-04 DIAGNOSIS — M79673 Pain in unspecified foot: Secondary | ICD-10-CM | POA: Insufficient documentation

## 2014-06-04 DIAGNOSIS — M79672 Pain in left foot: Secondary | ICD-10-CM | POA: Diagnosis present

## 2014-06-04 MED ORDER — IBUPROFEN 600 MG PO TABS: 600.0000 mg | ORAL_TABLET | Freq: Three times a day (TID) | ORAL | Status: DC | PRN

## 2014-06-04 NOTE — Progress Notes (Signed)
   Subjective:    Patient ID: Jeremy Hill, male    DOB: 23-Nov-1945, 69 y.o.   MRN: [id number]  HPI Comments: Jeremy Hill is a 69 year old male with PMH as below here for left heel pain x 4 days.  Please see problem based assessment and plan for details.      Past Medical History  Diagnosis Date  . Hypertension   . Neutropenia 2008    noted on cbc (01/10/2007) WBC = 3.9, also CBC done 08/2006 showed WBC = 3.0, unclear etiology; CXR done 08/2006 showed questionable right lung nodule and the follow up CT was recommended, there is no data in the [place] that it was done  . Depression   . Osteomyelitis 12/2001    per MRI of the spine - GIVEN THE ABNORMALITY AT THE T8-9 LEVEL, INFECTION AT THESE LATTER REGIONS CANNOT BE COMPLETELY EXCLUDED AND  WILL NEED TO BE FOLLOWED CLOSELY. SCATTERED DEGENERATIVE CHANGES IN THE LOWER  THORACIC/LUMBAR SPINE  AT THE L4-5 SIGNIFICANT NEURAL FORAMINAL NARROWING (R>L).  DECREASED SIGNAL INTENSITY OF BONE MARROW. UNDERLYING ANEMIA/INFILTRATIVE PROCESS/ LYMPHOMA.   . Osteomyelitis, chronic 2004    persistant osteomyelitis per MRI 04/2002 and also progressive at the same level as in 2003 T8-9 level  . Anemia 2008    borderline low Hg/Hct = 12.6/39.6 (01/10/2007), no anemia panel available and patient had refused colonoscopy, last colonoscopy done was in 2005 and the results were normal, done by Dr. Collene Mares   Review of Systems  Constitutional: Negative for fever, chills and appetite change.  Respiratory: Negative for shortness of breath.   Cardiovascular: Negative for chest pain and leg swelling.  Gastrointestinal: Negative for nausea, vomiting, diarrhea, constipation and blood in stool.  Genitourinary: Negative for dysuria, hematuria and difficulty urinating.       Filed Vitals:   06/04/14 1055  BP: 143/90  Pulse: 79  Temp: 97.8 F (36.6 C)  TempSrc: Oral  Weight: 212 lb 11.2 oz (96.48 kg)  SpO2: 99%   Objective:   Physical Exam  Constitutional: He is  oriented to person, place, and time. He appears well-developed. No distress.  HENT:  Head: Normocephalic and atraumatic.  Mouth/Throat: Oropharynx is clear and moist. No oropharyngeal exudate.  Eyes: EOM are normal. Pupils are equal, round, and reactive to light.  Neck: Neck supple.  Cardiovascular: Normal rate, regular rhythm and normal heart sounds.  Exam reveals no gallop and no friction rub.   No murmur heard. 2+ dorsalis pedis pulses  Pulmonary/Chest: Effort normal and breath sounds normal. No respiratory distress. He has no wheezes. He has no rales.  Abdominal: Soft. Bowel sounds are normal. He exhibits no distension and no mass. There is no tenderness. There is no rebound and no guarding.  Musculoskeletal: Normal range of motion. He exhibits tenderness. He exhibits no edema.  Left heel is TTP, there is no swelling or redness, the skin is intact.  The pain is isolated to the heel and does not extend across the entire fascia or involve the midfoot or forefoot.  Eversion/inversion/dorsiflexion/plantarfexion normal.  Achilles tendon intact and non-tender.  Neurological: He is alert and oriented to person, place, and time. No cranial nerve deficit.  Skin: Skin is warm. He is not diaphoretic.  Psychiatric: He has a normal mood and affect. His behavior is normal. Judgment and thought content normal.  Vitals reviewed.         Assessment & Plan:  Please see problem based assessment and plan.

## 2014-06-04 NOTE — Patient Instructions (Addendum)
1. I am prescribing ibuprofen for your heel pain.  Try this medication three times per day for the next five days.  Try icing your heel throughout the day for pain relief as well.  Also, try not to put weight on your foot until it is feeling better.   If you can, try using a gel insert for your shoes and avoid hard bottom shoes or shoes which make the pain worse.  Please come back to see [place] in 1 week if you are still having pain.  We may need to xray your foot if your pain continues.    2. Please take all medications as prescribed.    3. If you have worsening of your symptoms or new symptoms arise, please call the clinic ([phone]), or go to the ER immediately if symptoms are severe.

## 2014-06-04 NOTE — Telephone Encounter (Signed)
Pt called - since 06/03/14 left foot has been very sensitive. Pt denies swelling. Unable to put any weight on left foot or to wear stocks or shoes.  Pt states seems to be in area of ankle. On the way to clinic - opening Dr Redmond Pulling 10:15AM - pt aware. Hilda Blades Adolf Ormiston RN 06/04/14 9:40AM

## 2014-06-04 NOTE — Assessment & Plan Note (Signed)
Left heel pain started on [place].  Pain came on gradually at the end of the day.  Throbbing at rest.  Pain is 6/10 but as high as 8/10 with bearing.  No redness or swelling of the foot.  No known trauma or bites.  Hurts to bear weight.  He tried ibuprofen (two tablets) which did not help.  Heat feels good on the foot.  There are no abnormalities in strength, sensation, circulation, range of motion on exam.  The foot appears normal without evidence of trauma.  Achilles tendon is intact, nontender.  The left heel is TTP, but midfoot and forefoot are non-tender.  Pain is not worse with first step of the day or improved with activity and the tenderness is isolated to heel (not the entire fascia) making me less suspicious for plantar fasciitis.  There is no numbness/tingling/burning or pain with dorsiflexion/plantar flexion making neurologic abnormality unlikely.  Given benign exam, heel pain likely due to heel contusion.   - ibuprofen 600mg  TID with meals x 5 days - rest, ice or heat prn, gel inserts for shoes if needed, avoid hard soled shoes - return to clinic in 1 week if pain persists; will need xray if pain persists at that time - patient agrees to above plan

## 2014-06-06 NOTE — Progress Notes (Signed)
Internal Medicine Clinic Attending  Case discussed with Dr. Wilson soon after the resident saw the patient.  We reviewed the resident's history and exam and pertinent patient test results.  I agree with the assessment, diagnosis, and plan of care documented in the resident's note.  

## 2014-06-13 ENCOUNTER — Telehealth: Payer: Self-pay | Admitting: *Deleted

## 2014-06-13 NOTE — Telephone Encounter (Signed)
Pt called and left a message on triage line foot is better. Notes a last appt in clinic - only return to clinic if pain continue in foot. Has appt with Dr Daryll Drown in 07/2014. Left message on ID recording - pt to call clinic if appt needs to be cancel. Hilda Blades Rachella Basden RN 06/13/14 3PM

## 2014-06-14 ENCOUNTER — Ambulatory Visit: Payer: Medicare Other | Admitting: Internal Medicine

## 2014-07-11 ENCOUNTER — Other Ambulatory Visit: Payer: Self-pay | Admitting: *Deleted

## 2014-07-11 ENCOUNTER — Other Ambulatory Visit: Payer: Self-pay | Admitting: Oncology

## 2014-07-11 MED ORDER — HYDROCODONE-ACETAMINOPHEN 10-325 MG PO TABS: 1.0000 | ORAL_TABLET | Freq: Three times a day (TID) | ORAL | Status: DC | PRN

## 2014-07-18 ENCOUNTER — Encounter: Payer: Self-pay | Admitting: Internal Medicine

## 2014-07-18 ENCOUNTER — Ambulatory Visit (INDEPENDENT_AMBULATORY_CARE_PROVIDER_SITE_OTHER): Payer: Medicare Other | Admitting: Internal Medicine

## 2014-07-18 VITALS — BP 159/103 | HR 70 | Temp 97.8°F | Ht 73.0 in | Wt 211.2 lb

## 2014-07-18 DIAGNOSIS — M79672 Pain in left foot: Secondary | ICD-10-CM

## 2014-07-18 DIAGNOSIS — I10 Essential (primary) hypertension: Secondary | ICD-10-CM | POA: Diagnosis not present

## 2014-07-18 DIAGNOSIS — Z0000 Encounter for general adult medical examination without abnormal findings: Secondary | ICD-10-CM

## 2014-07-18 DIAGNOSIS — M546 Pain in thoracic spine: Secondary | ICD-10-CM | POA: Diagnosis present

## 2014-07-18 DIAGNOSIS — K5900 Constipation, unspecified: Secondary | ICD-10-CM | POA: Diagnosis not present

## 2014-07-18 DIAGNOSIS — L409 Psoriasis, unspecified: Secondary | ICD-10-CM

## 2014-07-18 MED ORDER — LISINOPRIL 10 MG PO TABS: 10.0000 mg | ORAL_TABLET | Freq: Every day | ORAL | Status: DC

## 2014-07-18 MED ORDER — AMLODIPINE BESYLATE 10 MG PO TABS: 10.0000 mg | ORAL_TABLET | Freq: Every day | ORAL | Status: DC

## 2014-07-18 MED ORDER — HYDROCHLOROTHIAZIDE 25 MG PO TABS: 25.0000 mg | ORAL_TABLET | Freq: Every day | ORAL | Status: DC

## 2014-07-18 MED ORDER — HYDROCODONE-ACETAMINOPHEN 10-325 MG PO TABS: 1.0000 | ORAL_TABLET | Freq: Three times a day (TID) | ORAL | Status: DC | PRN

## 2014-07-18 MED ORDER — DOCUSATE SODIUM 100 MG PO CAPS: 100.0000 mg | ORAL_CAPSULE | Freq: Two times a day (BID) | ORAL | Status: AC

## 2014-07-18 NOTE — Assessment & Plan Note (Signed)
This is a new issue for him, though he cannot exactly quantify how long it has been going on.  Not associated with blood, straining, dark stools, abdominal pain, nausea, vomiting.  He has changed his diet to lose weight over the last few years including increasing his fruit, vegetable and fiber intake.  This could explain some of the issue.  He has also not had a repeat colonoscopy, last in 2005 which was reported as normal.  The most concerning cause of his change in bowel habits could be related to a GI malignancy, however, it could be related to dietary changes as well.  We discussed colonoscopy at length and he noted that he is scared to have the study as a friend of his was diagnosed with colon cancer after his colonoscopy.  We discussed how finding a cancer early could be life saving.  We agreed to start with stool cards today, which he is willing to return.   Plan Stool cards Colace 100mg  BID Return visit for further investigation if not improved

## 2014-07-18 NOTE — Patient Instructions (Signed)
General Instructions: Please schedule a follow up visit within the next 6 months, sooner if needed.   For your medications:   Please bring all of your pill  Bottles with you to each visit.  This will help make sure that we have an up to date list of all the medications you are taking.  Please also bring any over the counter herbal medications you are taking (not including advil, tylenol, etc.)  Please continue taking all of your medications as prescribed.   Please start taking colace 100mg  twice a day for constipation.   Please return stool cards at your earliest convenience.    Thank you!   Treatment Goals:  Goals (1 Years of Data) as of 07/18/14    None      Progress Toward Treatment Goals:  Treatment Goal 07/18/2014  Blood pressure deteriorated    Self Care Goals & Plans:  Self Care Goal 01/24/2014  Manage my medications take my medicines as prescribed; refill my medications on time  Monitor my health -  Eat healthy foods eat more vegetables; eat foods that are low in salt; eat baked foods instead of fried foods  Be physically active take a walk every day; find an activity I enjoy    No flowsheet data found.   Care Management & Community Referrals:  Referral 12/26/2012  Referrals made for care management support none needed     Docusate capsules What is this medicine? DOCUSATE (doc CUE sayt) is stool softener. It helps prevent constipation and straining or discomfort associated with hard or dry stools. This medicine may be used for other purposes; ask your health care provider or pharmacist if you have questions. COMMON BRAND NAME(S): Colace, Colace Clear, Correctol, D.O.S., DC, Doc-Q-Lace, DocuLace, Docusoft S, DOK, Dulcolax, Genasoft, Kao-Tin, Kaopectate Liqui-Gels, Phillips Stool Softener, Stool Softener, Stool Softner DC, Sulfolax, Sur-Q-Lax, Surfak, Uni-Ease What should I tell my health care provider before I take this medicine? They need to know if you have  any of these conditions: -nausea or vomiting -severe constipation -stomach pain -sudden change in bowel habit lasting more than 2 weeks -an unusual or allergic reaction to docusate, other medicines, foods, dyes, or preservatives -pregnant or trying to get pregnant -breast-feeding How should I use this medicine? Take this medicine by mouth with a glass of water. Follow the directions on the label. Take your doses at regular intervals. Do not take your medicine more often than directed. Talk to your pediatrician regarding the use of this medicine in children. While this medicine may be prescribed for children as young as 2 years for selected conditions, precautions do apply. Overdosage: If you think you have taken too much of this medicine contact a poison control center or emergency room at once. NOTE: This medicine is only for you. Do not share this medicine with others. What if I miss a dose? If you miss a dose, take it as soon as you can. If it is almost time for your next dose, take only that dose. Do not take double or extra doses. What may interact with this medicine? -mineral oil This list may not describe all possible interactions. Give your health care provider a list of all the medicines, herbs, non-prescription drugs, or dietary supplements you use. Also tell them if you smoke, drink alcohol, or use illegal drugs. Some items may interact with your medicine. What should I watch for while using this medicine? Do not use for more than one week without advice from your doctor  or health care professional. If your constipation returns, check with your doctor or health care professional. Drink plenty of water while taking this medicine. Drinking water helps decrease constipation. Stop using this medicine and contact your doctor or health care professional if you experience any rectal bleeding or do not have a bowel movement after use. These could be signs of a more serious condition. What  side effects may I notice from receiving this medicine? Side effects that you should report to your doctor or health care professional as soon as possible: -allergic reactions like skin rash, itching or hives, swelling of the face, lips, or tongue Side effects that usually do not require medical attention (report to your doctor or health care professional if they continue or are bothersome): -diarrhea -stomach cramps -throat irritation This list may not describe all possible side effects. Call your doctor for medical advice about side effects. You may report side effects to FDA at 1-800-FDA-1088. Where should I keep my medicine? Keep out of the reach of children. Store at room temperature between 15 and 30 degrees C (59 and 86 degrees F). Throw away any unused medicine after the expiration date. NOTE: This sheet is a summary. It may not cover all possible information. If you have questions about this medicine, talk to your doctor, pharmacist, or health care provider.  2015, Elsevier/Gold Standard. (2007-06-09 15:56:49)

## 2014-07-18 NOTE — Assessment & Plan Note (Signed)
Resolved
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
## 2014-07-18 NOTE — Assessment & Plan Note (Signed)
Well controlled on current pain regimen.  He has been on a chronic regimen of hydrocodone/apap 10/325 for a while with #90 per month.  We resigned a pain medication contract today.  He has difficulty walking more than 100 feet due to the pain and has difficulty getting into and out of his car.  He has had a handicapped placard, which was resigned today.    Sign pain contract Refill medication

## 2014-07-18 NOTE — Assessment & Plan Note (Signed)
Colon cancer screening - discussed above, stool cards today HIV negative in 2010 Random blood glucose 102

## 2014-07-18 NOTE — Assessment & Plan Note (Signed)
Follows with Derm, stable.

## 2014-08-28 ENCOUNTER — Other Ambulatory Visit: Payer: Self-pay | Admitting: *Deleted

## 2014-08-28 NOTE — Telephone Encounter (Signed)
Last refilled on 07/12/14. Last appt was on 07/18/14.

## 2014-08-29 MED ORDER — HYDROCODONE-ACETAMINOPHEN 10-325 MG PO TABS: 1.0000 | ORAL_TABLET | Freq: Three times a day (TID) | ORAL | Status: DC | PRN

## 2014-08-29 NOTE — Telephone Encounter (Signed)
Rx ready - pt called/informed. 

## 2014-09-25 ENCOUNTER — Other Ambulatory Visit: Payer: Self-pay | Admitting: *Deleted

## 2014-09-25 ENCOUNTER — Telehealth: Payer: Self-pay | Admitting: Internal Medicine

## 2014-09-25 MED ORDER — HYDROCODONE-ACETAMINOPHEN 10-325 MG PO TABS: 1.0000 | ORAL_TABLET | Freq: Three times a day (TID) | ORAL | Status: DC | PRN

## 2014-09-25 NOTE — Telephone Encounter (Signed)
Patient asking for pain med that he picks up rx here in office

## 2014-09-25 NOTE — Telephone Encounter (Signed)
Last filled 6/29 at [facility] i can find no uds in chart Last visit 5/18, states next appt should be in 4 months- sept, will make appt when pt comes to pick script up

## 2014-09-25 NOTE — Telephone Encounter (Signed)
requested

## 2014-09-25 NOTE — Telephone Encounter (Signed)
Pt informed Rx is ready 

## 2014-10-18 ENCOUNTER — Other Ambulatory Visit: Payer: Self-pay | Admitting: Internal Medicine

## 2014-10-18 NOTE — Telephone Encounter (Signed)
Patient asking for pain medication he picks up from [place]

## 2014-10-18 NOTE — Telephone Encounter (Signed)
Pt calling ahead of time for refill.  He still has some left. Last refill 7/26  Due a week from Friday. Last OV 5/18

## 2014-10-23 MED ORDER — HYDROCODONE-ACETAMINOPHEN 10-325 MG PO TABS: 1.0000 | ORAL_TABLET | Freq: Three times a day (TID) | ORAL | Status: DC | PRN

## 2014-10-23 NOTE — Telephone Encounter (Signed)
Pt informed he may pick script up 8/26 friday

## 2014-11-19 ENCOUNTER — Other Ambulatory Visit: Payer: Self-pay | Admitting: Internal Medicine

## 2014-11-19 NOTE — Telephone Encounter (Signed)
Last picked up at office 8/26 appt scheduled for 01/2015

## 2014-11-19 NOTE — Telephone Encounter (Signed)
Pt called requesting hydrocodone to be filled.  °

## 2014-11-21 MED ORDER — HYDROCODONE-ACETAMINOPHEN 10-325 MG PO TABS: 1.0000 | ORAL_TABLET | Freq: Three times a day (TID) | ORAL | Status: DC | PRN

## 2014-11-21 NOTE — Telephone Encounter (Signed)
Message left Rx is ready. Hilda Blades Ditzler RN 11/21/14 11:15AM

## 2014-11-21 NOTE — Telephone Encounter (Signed)
Pt checking on his hydrocodone refill. Patient states his refill is due on a Sunday and wants to know when he can pick it up and has not rec'd a call.

## 2014-12-24 ENCOUNTER — Other Ambulatory Visit: Payer: Self-pay | Admitting: Internal Medicine

## 2014-12-24 MED ORDER — HYDROCODONE-ACETAMINOPHEN 10-325 MG PO TABS: 1.0000 | ORAL_TABLET | Freq: Three times a day (TID) | ORAL | Status: DC | PRN

## 2014-12-24 NOTE — Telephone Encounter (Signed)
Pt informed rx ready for pick up

## 2014-12-24 NOTE — Telephone Encounter (Signed)
Pt requesting hydrocodone to be filled.  °

## 2014-12-24 NOTE — Telephone Encounter (Signed)
Last refill 9/22 Last office visit 5/18 Future apt 11/16 No UDS in hx

## 2015-01-16 ENCOUNTER — Ambulatory Visit: Payer: Medicare Other | Admitting: Internal Medicine

## 2015-01-23 ENCOUNTER — Ambulatory Visit (INDEPENDENT_AMBULATORY_CARE_PROVIDER_SITE_OTHER): Payer: Medicare HMO | Admitting: Internal Medicine

## 2015-01-23 ENCOUNTER — Encounter: Payer: Self-pay | Admitting: Internal Medicine

## 2015-01-23 VITALS — BP 133/78 | HR 52 | Temp 98.2°F | Ht 73.0 in | Wt 214.4 lb

## 2015-01-23 DIAGNOSIS — Z23 Encounter for immunization: Secondary | ICD-10-CM | POA: Diagnosis not present

## 2015-01-23 DIAGNOSIS — K5900 Constipation, unspecified: Secondary | ICD-10-CM | POA: Diagnosis not present

## 2015-01-23 DIAGNOSIS — M546 Pain in thoracic spine: Secondary | ICD-10-CM

## 2015-01-23 DIAGNOSIS — Z0000 Encounter for general adult medical examination without abnormal findings: Secondary | ICD-10-CM

## 2015-01-23 DIAGNOSIS — I10 Essential (primary) hypertension: Secondary | ICD-10-CM | POA: Diagnosis not present

## 2015-01-23 DIAGNOSIS — L409 Psoriasis, unspecified: Secondary | ICD-10-CM

## 2015-01-23 MED ORDER — HYDROCODONE-ACETAMINOPHEN 10-325 MG PO TABS: 1.0000 | ORAL_TABLET | Freq: Three times a day (TID) | ORAL | Status: DC | PRN

## 2015-01-23 MED ORDER — MELOXICAM 7.5 MG PO TABS: 7.5000 mg | ORAL_TABLET | Freq: Every day | ORAL | Status: DC | PRN

## 2015-01-23 NOTE — Assessment & Plan Note (Addendum)
Patient reports that pain is controlled to his normal extent.  He has flares of the pain which cause him to take extra of his pain medicatino and then he will run out by the end of the month.  We discussed ways to decrease the chance of over-exerting himself and limiting his bad days.  We also discussed the danger of taking too much tylenol, which he is nearing to with taking 10-12 pills.    We discussed continued exercise, weight loss and diet to help with his pain as well.   He is not currently taking an anti-inflammatory.    Prescribed meloxicam 7.5mg  daily PRN for pain on his bad days ONLY. I advised him not to take daily and not to repeat doses. Check BMET today for renal function.   UDS today to update his pain contract.  He has not taken vicoden for 1 week.

## 2015-01-23 NOTE — Assessment & Plan Note (Signed)
Well controlled currently.  He is not interested in medical therapy.

## 2015-01-23 NOTE — Assessment & Plan Note (Signed)
Very well controlled on Colace.  Continue colace.

## 2015-01-23 NOTE — Patient Instructions (Addendum)
General Instructions: Please schedule a follow up visit within the next 6 months, sooner if needed.   For your medications:   Please bring all of your pill  Bottles with you to each visit.  This will help make sure that we have an up to date list of all the medications you are taking.  Please also bring any over the counter herbal medications you are taking (not including advil, tylenol, etc.)  Please continue taking all of your medications as prescribed.   For your back pain  On your bad days, please try to limit your Vicoden to 6-8 tablets to avoid taking too much tylenol.  You will have a new prescription for a medication called Meloxicam.  If you think you are going to have a bad day, take this first in the morning to see if it helps.  Do not take this medication more than once.  Please call me if you have any questions, more information is below.   Please return the stool cards at your earliest convenience.    Thank you!   Treatment Goals:  Goals (1 Years of Data) as of 01/23/15    None      Progress Toward Treatment Goals:  Treatment Goal 01/23/2015  Blood pressure at goal    Self Care Goals & Plans:  Self Care Goal 01/23/2015  Manage my medications take my medicines as prescribed; bring my medications to every visit; refill my medications on time  Monitor my health keep track of my blood pressure  Eat healthy foods eat more vegetables; eat foods that are low in salt; eat baked foods instead of fried foods  Be physically active find an activity I enjoy    No flowsheet data found.   Care Management & Community Referrals:  Referral 12/26/2012  Referrals made for care management support none needed      Meloxicam capsules What is this medicine? MELOXICAM (mel OX i cam) is a non-steroidal anti-inflammatory drug (NSAID). It is used to reduce swelling and to treat pain. It is used for osteoarthritis. This medicine may be used for other purposes; ask your health  care provider or pharmacist if you have questions. What should I tell my health care provider before I take this medicine? They need to know if you have any of these conditions: -bleeding disorders -cigarette smoker -coronary artery bypass graft (CABG) surgery within the past 2 weeks -drink more than 3 alcohol-containing drinks per day -heart disease -high blood pressure -history of stomach bleeding -kidney disease -liver disease -lung or breathing disease, like asthma -stomach or intestine problems -an unusual or allergic reaction to meloxicam, aspirin, other NSAIDs, other medicines, foods, dyes, or preservatives -pregnant or trying to get pregnant -breast-feeding How should I use this medicine? Take this medicine by mouth with a full glass of water. Follow the directions on the prescription label. You can take it with or without food. If it upsets your stomach, take it with food. Take your medicine at regular intervals. Do not take it more often than directed. Do not stop taking except on your doctor's advice. A special MedGuide will be given to you by the pharmacist with each prescription and refill. Be sure to read this information carefully each time. Talk to your pediatrician regarding the use of this medicine in children. Special care may be needed. Patients over 27 years old may have a stronger reaction and need a smaller dose. Overdosage: If you think you have taken too much of this  medicine contact a poison control center or emergency room at once. NOTE: This medicine is only for you. Do not share this medicine with others. What if I miss a dose? If you miss a dose, take it as soon as you can. If it is almost time for your next dose, take only that dose. Do not take double or extra doses. What may interact with this medicine? Do not take this medicine with any of the following medications: -cidofovir -ketorolac This medicine may also interact with the following  medications: -aspirin and aspirin-like medicines -certain medicines for blood pressure, heart disease, irregular heart beat -certain medicines for depression, anxiety, or psychotic disturbances -certain medicines that treat or prevent blood clots like warfarin, enoxaparin, dalteparin, apixaban, dabigatran, rivaroxaban -cyclosporine -digoxin -diuretics -methotrexate -other NSAIDs, medicines for pain and inflammation, like ibuprofen and naproxen -pemetrexed This list may not describe all possible interactions. Give your health care provider a list of all the medicines, herbs, non-prescription drugs, or dietary supplements you use. Also tell them if you smoke, drink alcohol, or use illegal drugs. Some items may interact with your medicine. What should I watch for while using this medicine? Tell your doctor or healthcare professional if your symptoms do not start to get better or if they get worse. Do not take other medicines that contain aspirin, ibuprofen, or naproxen with this medicine. Side effects such as stomach upset, nausea, or ulcers may be more likely to occur. Many medicines available without a prescription should not be taken with this medicine. This medicine can cause ulcers and bleeding in the stomach and intestines at any time during treatment. This can happen with no warning and may cause death. There is increased risk with taking this medicine for a long time. Smoking, drinking alcohol, older age, and poor health can also increase risks. Call your doctor right away if you have stomach pain or blood in your vomit or stool. This medicine does not prevent heart attack or stroke. In fact, this medicine may increase the chance of a heart attack or stroke. The chance may increase with longer use of this medicine and in people who have heart disease. If you take aspirin to prevent heart attack or stroke, talk with your doctor or health care professional. What side effects may I notice from  receiving this medicine? Side effects that you should report to your doctor or health care professional as soon as possible: -allergic reactions like skin rash, itching or hives, swelling of the face, lips, or tongue -nausea, vomiting -signs and symptoms of a blood clot such as breathing problems; changes in vision; chest pain; severe, sudden headache; pain, swelling, warmth in the leg; trouble speaking; sudden numbness or weakness of the face, arm, or leg -signs and symptoms of bleeding such as bloody or black, tarry stools; red or dark-brown urine; spitting up blood or brown material that looks like coffee grounds; red spots on the skin; unusual bruising or bleeding from the eye, gums, or nose -signs and symptoms of liver injury like dark yellow or brown urine; general ill feeling or flu-like symptoms; light-colored stools; loss of appetite; nausea; right upper belly pain; unusually weak or tired; yellowing of the eyes or skin -signs and symptoms of stroke like changes in vision; confusion; trouble speaking or understanding; severe headaches; sudden numbness or weakness of the face, arm, or leg; trouble walking; dizziness; loss of balance or coordination Side effects that usually do not require medical attention (report these to your doctor or health  care professional if they continue or are bothersome): -constipation -diarrhea -gas This list may not describe all possible side effects. Call your doctor for medical advice about side effects. You may report side effects to FDA at 1-800-FDA-1088. Where should I keep my medicine? Keep out of the reach of children. Store at room temperature between 15 and 30 degrees C (59 and 86 degrees F). Throw away any unused medicine after the expiration date. NOTE: This sheet is a summary. It may not cover all possible information. If you have questions about this medicine, talk to your doctor, pharmacist, or health care provider.    2016, Elsevier/Gold  Standard. (2014-02-24 00:44:17)

## 2015-01-23 NOTE — Assessment & Plan Note (Signed)
BP Readings from Last 3 Encounters:  01/23/15 133/78  07/18/14 159/103  06/04/14 141/81    Lab Results  Component Value Date   NA 140 01/24/2014   K 3.8 01/24/2014   CREATININE 0.99 01/24/2014    Assessment: Blood pressure control: controlled Progress toward BP goal:  at goal   Plan: Medications:  continue current medications, amlodipine, hctz, lisinopril Educational resources provided:   Self management tools provided:   Other plans: BMET today for renal function.

## 2015-01-23 NOTE — Assessment & Plan Note (Signed)
DRE/PSA - discussed, does not wish for screening.  Denies FH of prostate cancer.  Colon cancer: FOBT cards preferred by patient, sent home with these Flu: today Tetanus: UTD PNA: Prevnar 13 today Zostavax: Discuss at next visit.

## 2015-01-23 NOTE — Progress Notes (Signed)
   Subjective:    Patient ID: Jeremy Hill, male    DOB: 01/06/1946, 69 y.o.   MRN: [id number]  CC: Routine follow up for HTN, 6 months  HPI  Mr. Weideman is a 69yo man with PMH of chronic low back pain, HTN, psoriasis who presents for routine follow up.   Mr. Hinsdale is doing very well today.  He has no specific complaints, but would like to discuss his back pain.    He notes that he has had back pain for quite a while.  His pain is mainly in his lower back, around his spine, worse at night.  He has had imaging (the most recent almost 10 years ago) which showed spinal stenosis.  His pain is the same as normal, but he is worried that he may one day have to have surgery.  He has no neuropathic symptoms.  His pain has improved since he began exercising and trying to lose weight. He is currently exercising regularly with [employer] and trying to eat right.  We discussed when surgery would be an option and what he can do for his pain now.  He reports not taking his pain medication daily, but when he has a "bad day" he will sometimes take up to 10-12 pills. Of his vicoden.   We discussed increased pain during some days, triggers for the pain (usually bad days are after a particularly active day) and ways to manage the pain other than taking excess medications.    His HTN is well controlled on a three drug regimen which he is taking without side effects.  He has psoriasis and this is currently well controlled on no medications.  We discussed screening exams including PSA, FOBT cards for colon cancer and immunizations.   He did not bring in his medications.   Review of Systems  Constitutional: Negative for fever, chills, activity change and fatigue.  Respiratory: Negative for cough, chest tightness and shortness of breath.   Cardiovascular: Negative for chest pain and leg swelling.  Gastrointestinal: Positive for constipation (welll controlled with colace). Negative for nausea and vomiting.    Genitourinary: Negative for dysuria, urgency, frequency, hematuria and difficulty urinating.       No nocturia  Musculoskeletal: Positive for back pain. Negative for arthralgias and gait problem.  Neurological: Negative for dizziness, weakness and light-headedness.  Denies shooting pain down the legs, numbness or difficulty with BM or urine production     Objective:   Physical Exam  Constitutional: He is oriented to person, place, and time. He appears well-developed and well-nourished. No distress.  HENT:  Head: Normocephalic and atraumatic.  Eyes: Conjunctivae are normal. No scleral icterus.  Cardiovascular: Normal rate, regular rhythm and normal heart sounds.   No murmur heard. Pulmonary/Chest: Effort normal and breath sounds normal. No respiratory distress. He has no wheezes.  Abdominal: Soft. Bowel sounds are normal.  Musculoskeletal: He exhibits no edema or tenderness.  Reproducible back pain in the lower back  Neurological: He is alert and oriented to person, place, and time.  Psychiatric: He has a normal mood and affect. His behavior is normal.   BMET, UDS today  FOBT cards today     Assessment & Plan:  RTC in 6 months, sooner if needed.

## 2015-01-24 LAB — BMP8+ANION GAP
ANION GAP: 15 mmol/L (ref 10.0–18.0)
BUN/Creatinine Ratio: 15 (ref 10–22)
BUN: 16 mg/dL (ref 8–27)
CALCIUM: 9.4 mg/dL (ref 8.6–10.2)
CO2: 24 mmol/L (ref 18–29)
CREATININE: 1.04 mg/dL (ref 0.76–1.27)
Chloride: 100 mmol/L (ref 97–106)
GFR, EST AFRICAN AMERICAN: 86 mL/min/{1.73_m2} (ref >59)
GFR, EST NON AFRICAN AMERICAN: 74 mL/min/{1.73_m2} (ref >59)
Glucose: 134 mg/dL — ABNORMAL HIGH (ref 65–99)
Potassium: 3.9 mmol/L (ref 3.5–5.2)
Sodium: 139 mmol/L (ref 136–144)

## 2015-02-02 LAB — TOXASSURE SELECT,+ANTIDEPR,UR: PDF: 0

## 2015-04-24 ENCOUNTER — Other Ambulatory Visit: Payer: Self-pay

## 2015-04-24 ENCOUNTER — Other Ambulatory Visit: Payer: Self-pay | Admitting: Internal Medicine

## 2015-04-24 MED ORDER — HYDROCODONE-ACETAMINOPHEN 10-325 MG PO TABS: 1.0000 | ORAL_TABLET | Freq: Three times a day (TID) | ORAL | Status: DC | PRN

## 2015-04-24 NOTE — Telephone Encounter (Signed)
Last rx written for 3 11/23- pt presented here today requesting his refill.

## 2015-05-07 ENCOUNTER — Telehealth: Payer: Self-pay | Admitting: Internal Medicine

## 2015-05-07 NOTE — Telephone Encounter (Signed)
APPT. REMINDER CALL, LMTCB °

## 2015-05-08 ENCOUNTER — Encounter: Payer: Self-pay | Admitting: Internal Medicine

## 2015-05-08 ENCOUNTER — Ambulatory Visit (INDEPENDENT_AMBULATORY_CARE_PROVIDER_SITE_OTHER): Payer: Medicare Other | Admitting: Internal Medicine

## 2015-05-08 VITALS — BP 137/82 | HR 54 | Temp 97.6°F | Ht 73.0 in | Wt 216.5 lb

## 2015-05-08 DIAGNOSIS — I739 Peripheral vascular disease, unspecified: Secondary | ICD-10-CM

## 2015-05-08 DIAGNOSIS — M79604 Pain in right leg: Secondary | ICD-10-CM

## 2015-05-08 DIAGNOSIS — M79606 Pain in leg, unspecified: Secondary | ICD-10-CM

## 2015-05-08 DIAGNOSIS — M546 Pain in thoracic spine: Secondary | ICD-10-CM

## 2015-05-08 DIAGNOSIS — R351 Nocturia: Secondary | ICD-10-CM

## 2015-05-08 DIAGNOSIS — I10 Essential (primary) hypertension: Secondary | ICD-10-CM | POA: Diagnosis not present

## 2015-05-08 DIAGNOSIS — M79605 Pain in left leg: Secondary | ICD-10-CM

## 2015-05-08 DIAGNOSIS — R350 Frequency of micturition: Secondary | ICD-10-CM | POA: Diagnosis not present

## 2015-05-08 NOTE — Patient Instructions (Signed)
[name] - -   For your urination issues, I will wait to see what your lab work showed.  If you labwork is normal, I will send in a medication for you to try to help with your symptoms.   For your leg pain, I would like you to get a test called an ABI to check for claudication, which is leg pain due to narrowed arteries in your legs.    I would like to see you back in 1-2 months to go over these results, I will also call you with the results.    Thank you!  Intermittent Claudication Intermittent claudication is pain in your leg that occurs when you walk or exercise and goes away when you rest. The pain can occur in one or both legs. CAUSES Intermittent claudication is caused by the buildup of plaque within the major arteries in the body (atherosclerosis). The plaque, which makes arteries stiff and narrow, prevents enough blood from reaching your leg muscles. The pain occurs when you walk or exercise because your muscles need more blood when you are moving and exercising. RISK FACTORS Risk factors include:  A family history of atherosclerosis.  A personal history of stroke or heart disease.  Older age.  Being inactive or overweight.  Smoking cigarettes.  Having another health condition such as:  Diabetes.  High blood pressure.  High cholesterol. SIGNS AND SYMPTOMS  Your hip or leg may:   Ache.  Cramp.  Feel tight.  Feel weak.  Feel heavy. Over time, you may feel pain in your calf, thigh, or hip. DIAGNOSIS  Your health care provider may diagnose intermittent claudication based on your symptoms and medical history. Your health care provider may also do tests to learn more about your condition. These may include:  Blood tests.  An ultrasound.  Imaging tests such as angiography, magnetic resonance angiography (MRA), and computed tomography angiography (CTA). TREATMENT You may be treated for problems such as:  High blood pressure.  High  cholesterol.  Diabetes. Other treatments may include:  Lifestyle changes such as:  Starting an exercise program.  Losing weight.  Quitting smoking.  Medicines to help restore blood flow through your legs.  Blood vessel surgery (angioplasty) to restore blood flow if your intermittent claudication is caused by severe peripheral artery disease. HOME CARE INSTRUCTIONS  Manage any other health conditions you have.  Eat a diet low in saturated fats and calories to maintain a healthy weight.  Quit smoking, if you smoke.  Take medicines only as directed by your health care provider.  If your health care provider recommended an exercise program for you, follow it as directed. Your exercise program may involve:  Walking three or more times a week.  Walking until you have certain symptoms of intermittent claudication.  Resting until symptoms go away.  Gradually increasing walking time to about 50 minutes a day. SEEK MEDICAL CARE IF: Your condition is not getting better or is getting worse. SEEK IMMEDIATE MEDICAL CARE IF:   You have chest pain.  You have difficulty breathing.  You develop arm weakness.  You have trouble speaking.  Your face begins to droop. MAKE SURE YOU:  Understand these instructions.  Will watch your condition.  Will get help if you are not doing well or get worse.   This information is not intended to replace advice given to you by your health care provider. Make sure you discuss any questions you have with your health care provider.   Document  Released: 12/20/2003 Document Revised: 03/09/2014 Document Reviewed: 05/25/2013 Elsevier Interactive Patient Education 2016 Elsevier Inc.  Ankle-Brachial Index Test The ankle-brachial index (ABI) test is used to find peripheral vascular disease (PVD). PVD is also known as peripheral arterial disease (PAD). PVD is the blocking or hardening of the arteries anywhere within the circulatory system beyond the  heart. PVD is caused by cholesterol deposits in your blood vessels (atherosclerosis). These deposits cause arteries to narrow. The delivery of oxygen to your tissues is impaired as a result. This can cause muscle pain and fatigue. This is called claudication. PVD means there may also be buildup of cholesterol in your:  Heart. This increases the risk of heart attacks.  Brain. This increases the risk of strokes. The ankle-brachial index test measures the blood flow in your arms and legs. This test also determines if blood vessels in your leg are narrowed by cholesterol deposits. There are additional causes of a reduced ankle-brachial index, such as inflammation of vessels or a clot in the vessels. However, these are much less common than narrowing due to cholesterol deposits. HOW IS THE ANKLE-BRACHIAL INDEX TEST DONE? The test is done while you are lying down and resting. Measurements are taken of the systolic pressure:  In your arm (brachial).  In your ankle at several points along your leg. Systolic pressure is the pressure inside your arteries when your heart pumps. The measurements are taken several times on both sides. Then, the highest systolic pressure of the ankle is divided by the highest brachial systolic pressure. The result is the ankle-brachial pressure ratio, or ABI. Sometimes this test is repeated after you have exercised on a treadmill for five minutes. You may have leg pain during the exercise portion of the test if you suffer from PAD. If the index number drops after exercise, this may show that PAD is present. A normal ABI ratio is between 0.9 and 1.4. A value below 0.9 is considered abnormal.    This information is not intended to replace advice given to you by your health care provider. Make sure you discuss any questions you have with your health care provider.   Document Released: 02/21/2004 Document Revised: 03/09/2014 Document Reviewed: 09/22/2013 Elsevier Interactive  Patient Education [employer].

## 2015-05-08 NOTE — Progress Notes (Signed)
   Subjective:    Patient ID: Jeremy Hill, male    DOB: 08/02/1945, 70 y.o.   MRN: [id number]  CC: aching legs, urinary problems  HPI  Legs: Aching leg pain in both calves and posterior thighs.  All the time.  Same during night or day.  Worse with walking, aching got worse with walking . He tried inserts in the shoes.  2 weeks of symptoms.  Never had issues before.  BP seems to be doing well.  No probs taking meds.  + daily aspirin, 325mg .  No history of CAD or MI.  No history of stroke.  Moving legs to make them feel better at night.  He does have a history of chronic low back pain.   Prostate problems: having to use the bathroom more at night, 3 X per night.  Multiple times during the day.  Dribbling, does not feel he empties his bladder.  No pain when he urinates.  No belly pain.  Occasional malodorous urine, but not constant.  This started in the last few months.  No FH of prostate cancer.   These were his main complaints today, no other issues noted.    Review of Systems  Constitutional: Positive for activity change (due to leg pain). Negative for fever and fatigue.  Respiratory: Negative for cough, choking and chest tightness.   Cardiovascular: Negative for chest pain and leg swelling.  Gastrointestinal: Negative for diarrhea, constipation, blood in stool and anal bleeding.  Genitourinary: Positive for urgency, frequency and enuresis. Negative for hematuria and penile pain.  Musculoskeletal: Positive for myalgias, back pain, arthralgias and gait problem.  Skin: Negative for rash and wound.       Objective:   Physical Exam  Constitutional: He is oriented to person, place, and time. He appears well-developed and well-nourished. No distress.  HENT:  Head: Normocephalic and atraumatic.  Eyes: Conjunctivae are normal. No scleral icterus.  Cardiovascular: Normal rate, regular rhythm and normal heart sounds.   No murmur heard. Pulmonary/Chest: Effort normal and breath sounds normal.  No respiratory distress. He has no wheezes.  Abdominal: Soft. Bowel sounds are normal.  Genitourinary: Rectal exam shows external hemorrhoid. Rectal exam shows no tenderness and anal tone normal. Guaiac negative stool. Prostate is enlarged. Prostate is not tender.  Musculoskeletal: He exhibits no edema or tenderness.  Decreased hair on the legs, no wounds, pulses were only faintly palpable, legs were warm and not cyanotic.  + onychomycosis on the toenails  Neurological: He is alert and oriented to person, place, and time.  Skin: Skin is warm and dry.  Psychiatric: He has a normal mood and affect. His behavior is normal.   ABI, UA, PSA today       Assessment & Plan:  RTC in 1 month.

## 2015-05-10 ENCOUNTER — Other Ambulatory Visit: Payer: Medicare Other

## 2015-05-11 DIAGNOSIS — N400 Enlarged prostate without lower urinary tract symptoms: Secondary | ICD-10-CM | POA: Insufficient documentation

## 2015-05-11 DIAGNOSIS — M79606 Pain in leg, unspecified: Secondary | ICD-10-CM | POA: Insufficient documentation

## 2015-05-11 NOTE — Assessment & Plan Note (Signed)
Both legs, crampy, worse with walking, symptoms consistent with claudication.   He has no other CVD per history, but given age, gender and HTN he does have risk factors for PVD.   ABI's ordered.

## 2015-05-11 NOTE — Assessment & Plan Note (Signed)
Continue PRN hydrocodone-tylenol 1 table every 8 hours.   Consider weaning medication at next visit.   PRN meloxicam.   This was not a main discussion during our visit.

## 2015-05-11 NOTE — Assessment & Plan Note (Signed)
BP well controlled today.  Continue current therapy which includes amlodipine, hctz, lisinopril

## 2015-05-11 NOTE — Assessment & Plan Note (Signed)
Symptoms most consistent with BPH.  He had a non tender, mildly enlarged prostate on exam.   UA and PSA today.   Start tamsulosin if these are normal.

## 2015-05-13 ENCOUNTER — Other Ambulatory Visit: Payer: Self-pay | Admitting: Internal Medicine

## 2015-05-13 ENCOUNTER — Other Ambulatory Visit (INDEPENDENT_AMBULATORY_CARE_PROVIDER_SITE_OTHER): Payer: Medicare Other

## 2015-05-13 DIAGNOSIS — R351 Nocturia: Secondary | ICD-10-CM

## 2015-05-13 DIAGNOSIS — R350 Frequency of micturition: Secondary | ICD-10-CM | POA: Diagnosis not present

## 2015-05-13 LAB — POCT URINALYSIS DIPSTICK
Bilirubin, UA: NEGATIVE
Glucose, UA: NEGATIVE
KETONES UA: NEGATIVE
Nitrite, UA: NEGATIVE
PH UA: 8.5
PROTEIN UA: NEGATIVE
RBC UA: NEGATIVE
SPEC GRAV UA: 1.02

## 2015-05-13 NOTE — Addendum Note (Signed)
Addended by: Larey Dresser A on: 05/13/2015 02:26 PM   Modules accepted: Orders

## 2015-05-14 LAB — URINALYSIS, COMPLETE
Bilirubin, UA: NEGATIVE
GLUCOSE, UA: NEGATIVE
Ketones, UA: NEGATIVE
Leukocytes, UA: NEGATIVE
NITRITE UA: NEGATIVE
RBC, UA: NEGATIVE
Specific Gravity, UA: 1.02 (ref 1.005–1.030)
Urobilinogen, Ur: 4 mg/dL — ABNORMAL HIGH (ref 0.2–1.0)
pH, UA: 8 — ABNORMAL HIGH (ref 5.0–7.5)

## 2015-05-14 LAB — MICROSCOPIC EXAMINATION
Casts: NONE SEEN /lpf
Epithelial Cells (non renal): NONE SEEN /hpf (ref 0–10)

## 2015-05-14 LAB — PSA: PROSTATE SPECIFIC AG, SERUM: 6.8 ng/mL — AB (ref 0.0–4.0)

## 2015-05-16 ENCOUNTER — Telehealth: Payer: Self-pay | Admitting: Internal Medicine

## 2015-05-16 ENCOUNTER — Other Ambulatory Visit: Payer: Self-pay | Admitting: Internal Medicine

## 2015-05-16 DIAGNOSIS — R350 Frequency of micturition: Secondary | ICD-10-CM

## 2015-05-16 MED ORDER — TAMSULOSIN HCL 0.4 MG PO CAPS: 0.4000 mg | ORAL_CAPSULE | Freq: Every day | ORAL | Status: DC

## 2015-05-16 NOTE — Telephone Encounter (Signed)
Called and spoke with Jeremy Hill' regarding his bloodwork, mildly elevated PSA.   He agreed after discussion with urology referral.  He is desperate for help with his urinary symptoms today, so will start tamsulosin in the interim. We discussed this and he is aware of risks/benefits of medication.

## 2015-05-16 NOTE — Telephone Encounter (Signed)
Results for bloodwork and urine test.

## 2015-05-20 ENCOUNTER — Ambulatory Visit (HOSPITAL_COMMUNITY)
Admission: RE | Admit: 2015-05-20 | Discharge: 2015-05-20 | Disposition: A | Discharge status: Home / Self Care | Payer: Medicare Other | Source: Ambulatory Visit | Attending: Surgery | Admitting: Surgery

## 2015-05-20 DIAGNOSIS — I739 Peripheral vascular disease, unspecified: Secondary | ICD-10-CM

## 2015-05-20 DIAGNOSIS — F329 Major depressive disorder, single episode, unspecified: Secondary | ICD-10-CM | POA: Insufficient documentation

## 2015-05-20 DIAGNOSIS — I10 Essential (primary) hypertension: Secondary | ICD-10-CM | POA: Diagnosis not present

## 2015-05-20 DIAGNOSIS — M79609 Pain in unspecified limb: Secondary | ICD-10-CM | POA: Diagnosis present

## 2015-05-21 DIAGNOSIS — R972 Elevated prostate specific antigen [PSA]: Secondary | ICD-10-CM | POA: Diagnosis not present

## 2015-06-03 ENCOUNTER — Telehealth: Payer: Self-pay | Admitting: Internal Medicine

## 2015-06-03 NOTE — Telephone Encounter (Signed)
APPT. REMINDER CALL, LMTCB °

## 2015-06-04 ENCOUNTER — Encounter: Payer: Self-pay | Admitting: Internal Medicine

## 2015-06-04 ENCOUNTER — Ambulatory Visit: Payer: Medicare Other | Admitting: Internal Medicine

## 2015-06-18 ENCOUNTER — Encounter: Payer: Self-pay | Admitting: Internal Medicine

## 2015-06-18 ENCOUNTER — Other Ambulatory Visit: Payer: Self-pay | Admitting: Internal Medicine

## 2015-06-18 ENCOUNTER — Ambulatory Visit (INDEPENDENT_AMBULATORY_CARE_PROVIDER_SITE_OTHER): Payer: Medicare Other | Admitting: Internal Medicine

## 2015-06-18 VITALS — BP 170/103 | HR 64 | Temp 98.1°F | Wt 215.2 lb

## 2015-06-18 DIAGNOSIS — H538 Other visual disturbances: Secondary | ICD-10-CM | POA: Diagnosis not present

## 2015-06-18 DIAGNOSIS — I10 Essential (primary) hypertension: Secondary | ICD-10-CM | POA: Diagnosis not present

## 2015-06-18 NOTE — Progress Notes (Signed)
Patient ID: Jeremy Hill, male   DOB: [birth year], 70 y.o.   MRN: [id number]     Subjective:   Patient ID: Jeremy Hill male    DOB: [birth year] 70 y.o.    MRN: [id number] Health Maintenance Due: Health Maintenance Due  Topic Date Due  . Hepatitis C Screening  11-18-1945  . ZOSTAVAX  01/27/2008    _________________________________________________  HPI: Jeremy Hill is a 70 y.o. male here for blurred vision.  Pt has a PMH outlined below.  Please see problem-based charting assessment and plan for further status of patient's chronic medical problems addressed at today's visit.  PMH: Past Medical History  Diagnosis Date  . Hypertension   . Neutropenia 2008    noted on cbc (01/10/2007) WBC = 3.9, also CBC done 08/2006 showed WBC = 3.0, unclear etiology; CXR done 08/2006 showed questionable right lung nodule and the follow up CT was recommended, there is no data in the [facility] that it was done  . Depression   . Osteomyelitis ([place]) 12/2001    per MRI of the spine - GIVEN THE ABNORMALITY AT THE T8-9 LEVEL, INFECTION AT THESE LATTER REGIONS CANNOT BE COMPLETELY EXCLUDED AND  WILL NEED TO BE FOLLOWED CLOSELY. SCATTERED DEGENERATIVE CHANGES IN THE LOWER  THORACIC/LUMBAR SPINE  AT THE L4-5 SIGNIFICANT NEURAL FORAMINAL NARROWING (R>L).  DECREASED SIGNAL INTENSITY OF BONE MARROW. UNDERLYING ANEMIA/INFILTRATIVE PROCESS/ LYMPHOMA.   . Osteomyelitis, chronic ([place]) 2004    persistant osteomyelitis per MRI 04/2002 and also progressive at the same level as in 2003 T8-9 level  . Anemia 2008    borderline low Hg/Hct = 12.6/39.6 (01/10/2007), no anemia panel available and patient had refused colonoscopy, last colonoscopy done was in 2005 and the results were normal, done by Dr. Collene Mares    Medications: Current Outpatient Prescriptions on File Prior to Visit  Medication Sig Dispense Refill  . amLODipine (NORVASC) 10 MG tablet Take 1 tablet (10 mg total) by mouth daily. 90 tablet 3  . aspirin 81 MG  tablet Take 81 mg by mouth daily.      Marland Kitchen docusate sodium (COLACE) 100 MG capsule Take 1 capsule (100 mg total) by mouth 2 (two) times daily. 60 capsule 1  . hydrochlorothiazide (HYDRODIURIL) 25 MG tablet Take 1 tablet (25 mg total) by mouth daily. 90 tablet 3  . HYDROcodone-acetaminophen (NORCO) 10-325 MG tablet Take 1 tablet by mouth every 8 (eight) hours as needed. 90 tablet 0  . lisinopril (PRINIVIL,ZESTRIL) 10 MG tablet Take 1 tablet (10 mg total) by mouth daily. 90 tablet 3  . meloxicam (MOBIC) 7.5 MG tablet Take 1 tablet (7.5 mg total) by mouth daily as needed for pain. 30 tablet 2  . tamsulosin (FLOMAX) 0.4 MG CAPS capsule Take 1 capsule (0.4 mg total) by mouth daily after supper. 30 capsule 1   No current facility-administered medications on file prior to visit.    Allergies: No Known Allergies  FH: Family History  Problem Relation Age of Onset  . Heart disease Mother   . Heart disease Father   . Diabetes Sister   . Suicidality Brother     SH: Social History   Social History  . Marital Status: Married    Spouse Name: N/A  . Number of Children: N/A  . Years of Education: N/A   Social History Main Topics  . Smoking status: Never Smoker   . Smokeless tobacco: None  . Alcohol Use: No  . Drug Use: No  . Sexual Activity:  Not Asked   Other Topics Concern  . None   Social History Narrative   Recently divorced.     Review of Systems: Constitutional: Negative for fever, chills.  Eyes: +Blurred vision.  Respiratory: Negative for cough and shortness of breath.  Cardiovascular: Negative for chest pain.  Gastrointestinal: Negative for nausea, vomiting. Neurological: Negative for dizziness.   Objective:   Vital Signs: Filed Vitals:   06/18/15 0823  BP: 170/103  Pulse: 64  Temp: 98.1 F (36.7 C)  TempSrc: Oral  Weight: 215 lb 3.2 oz (97.614 kg)  SpO2: 100%     BP Readings from Last 3 Encounters:  06/18/15 170/103  05/08/15 137/82  01/23/15 133/78     Physical Exam: Constitutional: Vital signs reviewed.  Patient is in NAD and cooperative with exam.  Head: Normocephalic and atraumatic. Eyes: EOMI, conjunctivae nl, no scleral icterus, intact pupillary reflex, unable to visualize optic disc.  Visual acuity: Right: 20/40, Left: 20/200. Neck: Supple. Cardiovascular: RRR, no MRG. Pulmonary/Chest: Normal effort Neurological: A&O x3, cranial nerves II-XII are grossly intact, moving all extremities. Skin: Warm, dry and intact.    Assessment & Plan:   Assessment and plan was discussed and formulated with my attending.

## 2015-06-18 NOTE — Assessment & Plan Note (Addendum)
Pt reports blurred vision in the left eye that began Friday and occurred gradually.  He had been shopping.  No tearing, redness.  He feels like there is something in his eye but denies pain.  No weakness, dizziness, slurred speech.  Does not wear glasses or contacts.  Unclear when he had his last eye exam but does not have an eye doctor.  He has been wearing sunglasses which he reports helps his vision but denies photophobia.  -referral to ophthalmology, has appt tomorrow

## 2015-06-18 NOTE — Progress Notes (Signed)
Case discussed with Dr. Gill soon after the resident saw the patient.  We reviewed the resident's history and exam and pertinent patient test results.  I agree with the assessment, diagnosis, and plan of care documented in the resident's note. 

## 2015-06-18 NOTE — Assessment & Plan Note (Addendum)
BP elevated today, however, he did not take his BP meds last night.  -reminded to take blood pressure meds consistently  -f/u in 1 month

## 2015-06-18 NOTE — Patient Instructions (Signed)
Thank you for your visit today.   Please return to the internal medicine clinic in to see Dr. Daryll Drown for a regular follow-up.   I will refer you to an eye doctor for your blurred vision.        Please be sure to bring all of your medications with you to every visit; this includes herbal supplements, vitamins, eye drops, and any over-the-counter medications.   Should you have any questions regarding your medications and/or any new or worsening symptoms, please be sure to call the clinic at [phone].   If you believe that you are suffering from a life threatening condition or one that may result in the loss of limb or function, then you should call 911 and proceed to the nearest Emergency Department.   A healthy lifestyle and preventative care can promote health and wellness.   Maintain regular health, dental, and eye exams.  Eat a healthy diet. Foods like vegetables, fruits, whole grains, low-fat dairy products, and lean protein foods contain the nutrients you need without too many calories. Decrease your intake of foods high in solid fats, added sugars, and salt. Get information about a proper diet from your caregiver, if necessary.  Regular physical exercise is one of the most important things you can do for your health. Most adults should get at least 150 minutes of moderate-intensity exercise (any activity that increases your heart rate and causes you to sweat) each week. In addition, most adults need muscle-strengthening exercises on 2 or more days a week.   Maintain a healthy weight. The body mass index (BMI) is a screening tool to identify possible weight problems. It provides an estimate of body fat based on height and weight. Your caregiver can help determine your BMI, and can help you achieve or maintain a healthy weight. For adults 20 years and older:  A BMI below 18.5 is considered underweight.  A BMI of 18.5 to 24.9 is normal.  A BMI of 25 to 29.9 is considered  overweight.  A BMI of 30 and above is considered obese.

## 2015-06-19 ENCOUNTER — Other Ambulatory Visit: Payer: Self-pay | Admitting: Internal Medicine

## 2015-06-19 DIAGNOSIS — H2513 Age-related nuclear cataract, bilateral: Secondary | ICD-10-CM | POA: Diagnosis not present

## 2015-06-19 DIAGNOSIS — H34812 Central retinal vein occlusion, left eye, with macular edema: Secondary | ICD-10-CM | POA: Diagnosis not present

## 2015-06-19 DIAGNOSIS — H40013 Open angle with borderline findings, low risk, bilateral: Secondary | ICD-10-CM | POA: Diagnosis not present

## 2015-06-19 MED ORDER — HYDROCODONE-ACETAMINOPHEN 10-325 MG PO TABS: 1.0000 | ORAL_TABLET | Freq: Three times a day (TID) | ORAL | Status: DC | PRN

## 2015-06-19 NOTE — Telephone Encounter (Signed)
Last refill - received 2 rxs in Feb. Last UDS 01/23/15. Last seen 4/18.

## 2015-06-19 NOTE — Telephone Encounter (Signed)
NEEDS REFILL FOR PAIN MEDICATION-HYDROCODONE- ACETAMINOPHEN 10.35MG 

## 2015-06-19 NOTE — Telephone Encounter (Signed)
Rx ready - pt called. Also pt requesting a handicap form for a new placard;states poor vision. Asked Dr Gordy Levan - who saw pt yesterday.  Pt can pick up form when he picks up rxs.

## 2015-06-26 ENCOUNTER — Ambulatory Visit: Payer: Medicare HMO | Admitting: Internal Medicine

## 2015-06-26 DIAGNOSIS — H35033 Hypertensive retinopathy, bilateral: Secondary | ICD-10-CM | POA: Diagnosis not present

## 2015-06-26 DIAGNOSIS — H25813 Combined forms of age-related cataract, bilateral: Secondary | ICD-10-CM | POA: Diagnosis not present

## 2015-06-26 DIAGNOSIS — H34811 Central retinal vein occlusion, right eye, with macular edema: Secondary | ICD-10-CM | POA: Diagnosis not present

## 2015-06-26 DIAGNOSIS — H401132 Primary open-angle glaucoma, bilateral, moderate stage: Secondary | ICD-10-CM | POA: Diagnosis not present

## 2015-07-22 ENCOUNTER — Other Ambulatory Visit: Payer: Self-pay | Admitting: Internal Medicine

## 2015-07-22 MED ORDER — HYDROCODONE-ACETAMINOPHEN 10-325 MG PO TABS: 1.0000 | ORAL_TABLET | Freq: Three times a day (TID) | ORAL | Status: DC | PRN

## 2015-07-22 NOTE — Telephone Encounter (Signed)
Last refill 4/20 OV 4/18 UDS 01/23/15 No future appointments

## 2015-07-22 NOTE — Telephone Encounter (Signed)
Pt informed

## 2015-07-22 NOTE — Addendum Note (Signed)
Addended by: Gilles Chiquito B on: 07/22/2015 04:09 PM   Modules accepted: Orders

## 2015-07-22 NOTE — Telephone Encounter (Signed)
Needs refill HYDROcodone-acetaminophen (NORCO) 10-325 MG tablet

## 2015-07-25 ENCOUNTER — Other Ambulatory Visit: Payer: Self-pay | Admitting: Internal Medicine

## 2015-08-12 DIAGNOSIS — H34811 Central retinal vein occlusion, right eye, with macular edema: Secondary | ICD-10-CM | POA: Diagnosis not present

## 2015-09-01 ENCOUNTER — Other Ambulatory Visit: Payer: Self-pay | Admitting: Internal Medicine

## 2015-09-13 ENCOUNTER — Other Ambulatory Visit: Payer: Self-pay | Admitting: Internal Medicine

## 2015-09-13 NOTE — Telephone Encounter (Signed)
Last appt 06/18/15 with Dr Gordy Levan.  Has an appt scheduled w/Dr Daryll Drown on 7/19.

## 2015-09-18 ENCOUNTER — Ambulatory Visit (INDEPENDENT_AMBULATORY_CARE_PROVIDER_SITE_OTHER): Payer: Medicare Other | Admitting: Internal Medicine

## 2015-09-18 ENCOUNTER — Encounter: Payer: Self-pay | Admitting: Internal Medicine

## 2015-09-18 VITALS — BP 139/87 | HR 70 | Temp 98.2°F | Ht 73.0 in | Wt 215.2 lb

## 2015-09-18 DIAGNOSIS — N400 Enlarged prostate without lower urinary tract symptoms: Secondary | ICD-10-CM | POA: Diagnosis not present

## 2015-09-18 DIAGNOSIS — M546 Pain in thoracic spine: Secondary | ICD-10-CM | POA: Diagnosis not present

## 2015-09-18 DIAGNOSIS — I10 Essential (primary) hypertension: Secondary | ICD-10-CM

## 2015-09-18 DIAGNOSIS — L409 Psoriasis, unspecified: Secondary | ICD-10-CM | POA: Diagnosis not present

## 2015-09-18 DIAGNOSIS — G8929 Other chronic pain: Secondary | ICD-10-CM | POA: Diagnosis not present

## 2015-09-18 DIAGNOSIS — M79606 Pain in leg, unspecified: Secondary | ICD-10-CM

## 2015-09-18 DIAGNOSIS — H348122 Central retinal vein occlusion, left eye, stable: Secondary | ICD-10-CM

## 2015-09-18 MED ORDER — HYDROCODONE-ACETAMINOPHEN 10-325 MG PO TABS: 1.0000 | ORAL_TABLET | Freq: Three times a day (TID) | ORAL | Status: DC | PRN

## 2015-09-18 NOTE — Assessment & Plan Note (Signed)
We evaluated him with a PSA at last visit and it was 6.8.  He went to see [facility] and I have reviewed their notes.  They recommended repeat PSA screening and he will follow up with them.  He is having improved urinary frequency with tamsulosin.  Given his recent vision issues, I am not sure that he has had time to go back to the Urologist office  Plan Continue tamsulosin Follow up with urology.

## 2015-09-18 NOTE — Assessment & Plan Note (Signed)
He has chronic back and leg pain for which he uses hydrocodone/apap 10/325 90 pills a month.  This is about 30MME/day which is below 50MME per day which starts to put a patient at increased risk for adverse events.  He has a pain medication contract.   Plan UDS at next visit if warranted [place] narcotic database review today shows appropriate refills.  Update pain contract if needed 3 month prescriptions today.  He has been on a stable dose without increase.

## 2015-09-18 NOTE — Assessment & Plan Note (Signed)
BP today is 137/87.  He is on amlodipine, HCTZ, lisinopril.   Plan:  Continue current regimen.

## 2015-09-18 NOTE — Assessment & Plan Note (Signed)
Following with ophthalmology and vision is slowly returning with injectable medication.   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

## 2015-09-18 NOTE — Patient Instructions (Signed)
Jeremy Hill - -   You are doing very well!   Please continue the medications you are on. 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   Please come back and see me in 6 months, sooner if needed.    Please call with any questions!  Take Care

## 2015-09-18 NOTE — Assessment & Plan Note (Signed)
Improved with an exercise program. ABIs reviewed and which were within normal limits.   Plan Continue exercise at the [facility] Continue prn hydrocodone.

## 2015-09-18 NOTE — Assessment & Plan Note (Signed)
Currently not causing him any problems.  He has chronic skin changes which do not bother him currently.  He has some chronic back pain and leg pain, but no active joint pain at this time.    Plan:  Continue surveillance.

## 2015-09-18 NOTE — Progress Notes (Signed)
   Subjective:    Patient ID: Jeremy Hill, male    DOB: 04-20-1945, 70 y.o.   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   Jeremy Hill reports that he is overall doing very well.  He has been seeing an eye doctor who is doing therapy for his central vein occlusion and his vision is slowly improving.  He has also been taking eye drops and is glad his vision is coming back.   He is taking his medications as prescribed and doing well with them.  He is exercising with the silver sneakers program and likes it a lot.  He has psoriasis plaques which do not bother him much.  He notes that the tamsulosin he was recently started on is helping his urinary symptoms quite a bit.   He did not bring in his medications.     Review of Systems  Constitutional: Negative for fever and fatigue.  Eyes: Positive for visual disturbance. Negative for photophobia.  Gastrointestinal: Negative for diarrhea and constipation.  Genitourinary: Positive for frequency. Negative for dysuria and difficulty urinating.  Musculoskeletal: Positive for back pain and arthralgias.  Skin: Negative for rash and wound.       Objective:   Physical Exam  Constitutional: He is oriented to person, place, and time. He appears well-developed and well-nourished. No distress.  HENT:  Head: Normocephalic and atraumatic.  Cardiovascular: Normal rate, regular rhythm and normal heart sounds.   No murmur heard. Pulmonary/Chest: Effort normal and breath sounds normal. No respiratory distress. He has no wheezes.  Neurological: He is alert and oriented to person, place, and time.  Skin: Skin is warm and dry.  + psoriatic plaques to extensor surfaces of arms.  Psychiatric: He has a normal mood and affect. His behavior is normal.   No labs today      Assessment & Plan:  F/U in  6 months

## 2015-09-29 ENCOUNTER — Other Ambulatory Visit: Payer: Self-pay | Admitting: Internal Medicine

## 2015-12-20 ENCOUNTER — Telehealth: Payer: Self-pay

## 2015-12-20 NOTE — Telephone Encounter (Signed)
HYDROcodone-acetaminophen , refill request

## 2015-12-20 NOTE — Telephone Encounter (Signed)
Dr Daryll Drown, pt is calling for refill of pain med, I see the July visit you gave 3 scripts but the [place] database shows a script from sept, I wonder if it is actually #3 from July, please review and advise

## 2015-12-23 ENCOUNTER — Other Ambulatory Visit: Payer: Self-pay | Admitting: Internal Medicine

## 2015-12-23 MED ORDER — HYDROCODONE-ACETAMINOPHEN 10-325 MG PO TABS: 1.0000 | ORAL_TABLET | Freq: Three times a day (TID) | ORAL | 0 refills | Status: DC | PRN

## 2015-12-23 NOTE — Progress Notes (Signed)
Tried to call, lm for rtc 

## 2015-12-23 NOTE — Progress Notes (Signed)
Reviewed [name] narcotic database and it was appropriate for 6 months.  Last UDS in 01/2015 appeared appropriate.  3 month refill.  He needs an appointment with me prior to further refills.  Have him make appointment in February if possible.

## 2015-12-24 NOTE — Telephone Encounter (Signed)
Pt is returning cal to nurse

## 2016-01-22 ENCOUNTER — Other Ambulatory Visit: Payer: Self-pay

## 2016-01-22 MED ORDER — TAMSULOSIN HCL 0.4 MG PO CAPS: ORAL_CAPSULE | ORAL | 3 refills | Status: DC

## 2016-01-22 NOTE — Telephone Encounter (Signed)
Please call pt back regarding meds.  

## 2016-01-22 NOTE — Telephone Encounter (Signed)
Patient going out of town requesting fomax refill

## 2016-02-05 ENCOUNTER — Encounter: Payer: Medicare Other | Admitting: Internal Medicine

## 2016-02-13 ENCOUNTER — Encounter (INDEPENDENT_AMBULATORY_CARE_PROVIDER_SITE_OTHER): Payer: Self-pay

## 2016-02-13 ENCOUNTER — Ambulatory Visit (INDEPENDENT_AMBULATORY_CARE_PROVIDER_SITE_OTHER): Payer: Medicare Other | Admitting: Internal Medicine

## 2016-02-13 ENCOUNTER — Encounter: Payer: Self-pay | Admitting: Internal Medicine

## 2016-02-13 VITALS — BP 135/77 | HR 59 | Temp 98.0°F | Ht 73.0 in | Wt 221.8 lb

## 2016-02-13 DIAGNOSIS — M48061 Spinal stenosis, lumbar region without neurogenic claudication: Secondary | ICD-10-CM

## 2016-02-13 DIAGNOSIS — I10 Essential (primary) hypertension: Secondary | ICD-10-CM

## 2016-02-13 DIAGNOSIS — Z0000 Encounter for general adult medical examination without abnormal findings: Secondary | ICD-10-CM

## 2016-02-13 DIAGNOSIS — Z79891 Long term (current) use of opiate analgesic: Secondary | ICD-10-CM

## 2016-02-13 DIAGNOSIS — N400 Enlarged prostate without lower urinary tract symptoms: Secondary | ICD-10-CM

## 2016-02-13 DIAGNOSIS — G8929 Other chronic pain: Secondary | ICD-10-CM | POA: Diagnosis not present

## 2016-02-13 DIAGNOSIS — M546 Pain in thoracic spine: Secondary | ICD-10-CM

## 2016-02-13 DIAGNOSIS — Z23 Encounter for immunization: Secondary | ICD-10-CM

## 2016-02-13 DIAGNOSIS — H348122 Central retinal vein occlusion, left eye, stable: Secondary | ICD-10-CM

## 2016-02-13 DIAGNOSIS — Z79899 Other long term (current) drug therapy: Secondary | ICD-10-CM

## 2016-02-13 DIAGNOSIS — L409 Psoriasis, unspecified: Secondary | ICD-10-CM

## 2016-02-13 MED ORDER — ZOSTER VACCINE LIVE 19400 UNT/0.65ML ~~LOC~~ SUSR: 0.6500 mL | Freq: Once | SUBCUTANEOUS | 0 refills | Status: DC

## 2016-02-13 MED ORDER — ATORVASTATIN CALCIUM 10 MG PO TABS: 10.0000 mg | ORAL_TABLET | Freq: Every day | ORAL | 6 refills | Status: DC

## 2016-02-13 MED ORDER — ZOSTER VACCINE LIVE 19400 UNT/0.65ML ~~LOC~~ SUSR: 0.6500 mL | Freq: Once | SUBCUTANEOUS | 0 refills | Status: AC

## 2016-02-13 NOTE — Assessment & Plan Note (Signed)
Relatively well controlled on creams and soap which he gets from his dermatologist.  He cannot remember the name of the office.

## 2016-02-13 NOTE — Assessment & Plan Note (Signed)
His symptoms are well controlled on tamsulosin.   Plan Continue tamsulosin

## 2016-02-13 NOTE — Assessment & Plan Note (Signed)
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  He has been well controlled on low dose hydrocodone for many years.  We updated his pain contract today and he had a UDS performed.     narcotic database review for 12 months was appropriate.   Plan 3 month refills of hydrocodone apap 10/325 take TID #90 refilled today UDS pending

## 2016-02-13 NOTE — Progress Notes (Signed)
   Subjective:    Patient ID: Jeremy Hill, male    DOB: Dec 06, 1945, 70 y.o.   MRN: [id number]  CC: 6 month follow up for HTN  HPI  Jeremy Hill is a 70yo man with PMH of HTN, Psoriasis, BPH, chronic back pain and central retinal vein occlusion who presents for follow up .   He reports that he is doing well.  He is somewhat sad at this time of year as his daughter lives in the [place] and he is usually alone at this time of year and lonely.  His daughter has asked him to find companionship but he is not sure about this.    We discussed his pain medication.  He is on hydrocodone 10mg  up to TID for chronic back pain. The medication allows him to be active and to continue to exercise.  He gets 90 pills a month which is 30 MME.  UDS today and updated his pain contract.    I calculated his 10 year ASCVD at 18.2% and we discussed starting a statin.    Review of Systems  Constitutional: Negative for activity change and appetite change.  Eyes: Positive for visual disturbance. Negative for pain and redness.  Respiratory: Negative for cough and shortness of breath.   Cardiovascular: Negative for chest pain.  Musculoskeletal: Positive for arthralgias and back pain.  Neurological: Negative for dizziness, weakness and numbness.       Objective:   Physical Exam  Constitutional: He is oriented to person, place, and time. He appears well-developed and well-nourished. No distress.  HENT:  Head: Normocephalic and atraumatic.  Cardiovascular: Normal rate, regular rhythm, normal heart sounds and intact distal pulses.   Pulmonary/Chest: Effort normal. No respiratory distress.  Abdominal: Soft. Bowel sounds are normal. He exhibits no distension.  Neurological: He is alert and oriented to person, place, and time.  Psychiatric: He has a normal mood and affect. His behavior is normal.  Vitals reviewed.   UDS, CMET today.       Assessment & Plan:  RTC in 4 months.

## 2016-02-13 NOTE — Patient Instructions (Signed)
Jeremy Hill - -  Today you will get the flu shot.  More information below.   You will have a prescription for the zostavax which is for the shingles.  Please take this to [facility] to get the shot.   For your cholesterol, please start Atorvastatin 10mg  daily.  You can take this at night.   Please call with any questions.  Come back to see me in 4 months.   Influenza Virus Vaccine injection (Fluarix) What is this medicine? INFLUENZA VIRUS VACCINE (in floo EN zuh VAHY ruhs vak SEEN) helps to reduce the risk of getting influenza also known as the flu. This medicine may be used for other purposes; ask your health care provider or pharmacist if you have questions. COMMON BRAND NAME(S): Fluarix, Fluzone What should I tell my health care provider before I take this medicine? They need to know if you have any of these conditions: -bleeding disorder like hemophilia -fever or infection -Guillain-Barre syndrome or other neurological problems -immune system problems -infection with the human immunodeficiency virus (HIV) or AIDS -low blood platelet counts -multiple sclerosis -an unusual or allergic reaction to influenza virus vaccine, eggs, chicken proteins, latex, gentamicin, other medicines, foods, dyes or preservatives -pregnant or trying to get pregnant -breast-feeding How should I use this medicine? This vaccine is for injection into a muscle. It is given by a health care professional. A copy of Vaccine Information Statements will be given before each vaccination. Read this sheet carefully each time. The sheet may change frequently. Talk to your pediatrician regarding the use of this medicine in children. Special care may be needed. Overdosage: If you think you have taken too much of this medicine contact a poison control center or emergency room at once. NOTE: This medicine is only for you. Do not share this medicine with others. What if I miss a dose? This does not apply. What may interact  with this medicine? -chemotherapy or radiation therapy -medicines that lower your immune system like etanercept, anakinra, infliximab, and adalimumab -medicines that treat or prevent blood clots like warfarin -phenytoin -steroid medicines like prednisone or cortisone -theophylline -vaccines This list may not describe all possible interactions. Give your health care provider a list of all the medicines, herbs, non-prescription drugs, or dietary supplements you use. Also tell them if you smoke, drink alcohol, or use illegal drugs. Some items may interact with your medicine. What should I watch for while using this medicine? Report any side effects that do not go away within 3 days to your doctor or health care professional. Call your health care provider if any unusual symptoms occur within 6 weeks of receiving this vaccine. You may still catch the flu, but the illness is not usually as bad. You cannot get the flu from the vaccine. The vaccine will not protect against colds or other illnesses that may cause fever. The vaccine is needed every year. What side effects may I notice from receiving this medicine? Side effects that you should report to your doctor or health care professional as soon as possible: -allergic reactions like skin rash, itching or hives, swelling of the face, lips, or tongue Side effects that usually do not require medical attention (report to your doctor or health care professional if they continue or are bothersome): -fever -headache -muscle aches and pains -pain, tenderness, redness, or swelling at site where injected -weak or tired This list may not describe all possible side effects. Call your doctor for medical advice about side effects. You may  report side effects to FDA at 1-800-FDA-1088. Where should I keep my medicine? This vaccine is only given in a clinic, pharmacy, doctor's office, or other health care setting and will not be stored at home. NOTE: This sheet is  a summary. It may not cover all possible information. If you have questions about this medicine, talk to your doctor, pharmacist, or health care provider.  2017 Elsevier/Gold Standard (2007-09-14 09:30:40)  Atorvastatin tablets What is this medicine? ATORVASTATIN (a TORE va sta tin) is known as a HMG-CoA reductase inhibitor or 'statin'. It lowers the level of cholesterol and triglycerides in the blood. This drug may also reduce the risk of heart attack, stroke, or other health problems in patients with risk factors for heart disease. Diet and lifestyle changes are often used with this drug. This medicine may be used for other purposes; ask your health care provider or pharmacist if you have questions. COMMON BRAND NAME(S): Lipitor What should I tell my health care provider before I take this medicine? They need to know if you have any of these conditions: -frequently drink alcoholic beverages -history of stroke, TIA -kidney disease -liver disease -muscle aches or weakness -other medical condition -an unusual or allergic reaction to atorvastatin, other medicines, foods, dyes, or preservatives -pregnant or trying to get pregnant -breast-feeding How should I use this medicine? Take this medicine by mouth with a glass of water. Follow the directions on the prescription label. You can take this medicine with or without food. Take your doses at regular intervals. Do not take your medicine more often than directed. Talk to your pediatrician regarding the use of this medicine in children. While this drug may be prescribed for children as young as 75 years old for selected conditions, precautions do apply. Overdosage: If you think you have taken too much of this medicine contact a poison control center or emergency room at once. NOTE: This medicine is only for you. Do not share this medicine with others. What if I miss a dose? If you miss a dose, take it as soon as you can. If it is almost time for  your next dose, take only that dose. Do not take double or extra doses. What may interact with this medicine? Do not take this medicine with any of the following medications: -red yeast rice -telaprevir -telithromycin -voriconazole This medicine may also interact with the following medications: -alcohol -antiviral medicines for HIV or AIDS -boceprevir -certain antibiotics like clarithromycin, erythromycin, troleandomycin -certain medicines for cholesterol like fenofibrate or gemfibrozil -cimetidine -clarithromycin -colchicine -cyclosporine -digoxin -male hormones, like estrogens or progestins and birth control pills -grapefruit juice -medicines for fungal infections like fluconazole, itraconazole, ketoconazole -niacin -rifampin -spironolactone This list may not describe all possible interactions. Give your health care provider a list of all the medicines, herbs, non-prescription drugs, or dietary supplements you use. Also tell them if you smoke, drink alcohol, or use illegal drugs. Some items may interact with your medicine. What should I watch for while using this medicine? Visit your doctor or health care professional for regular check-ups. You may need regular tests to make sure your liver is working properly. Tell your doctor or health care professional right away if you get any unexplained muscle pain, tenderness, or weakness, especially if you also have a fever and tiredness. Your doctor or health care professional may tell you to stop taking this medicine if you develop muscle problems. If your muscle problems do not go away after stopping this medicine, contact your health  care professional. This drug is only part of a total heart-health program. Your doctor or a dietician can suggest a low-cholesterol and low-fat diet to help. Avoid alcohol and smoking, and keep a proper exercise schedule. Do not use this drug if you are pregnant or breast-feeding. Serious side effects to an  unborn child or to an infant are possible. Talk to your doctor or pharmacist for more information. This medicine may affect blood sugar levels. If you have diabetes, check with your doctor or health care professional before you change your diet or the dose of your diabetic medicine. If you are going to have surgery tell your health care professional that you are taking this drug. What side effects may I notice from receiving this medicine? Side effects that you should report to your doctor or health care professional as soon as possible: -allergic reactions like skin rash, itching or hives, swelling of the face, lips, or tongue -dark urine -fever -joint pain -muscle cramps, pain -redness, blistering, peeling or loosening of the skin, including inside the mouth -trouble passing urine or change in the amount of urine -unusually weak or tired -yellowing of eyes or skin Side effects that usually do not require medical attention (report to your doctor or health care professional if they continue or are bothersome): -constipation -heartburn -stomach gas, pain, upset This list may not describe all possible side effects. Call your doctor for medical advice about side effects. You may report side effects to FDA at 1-800-FDA-1088. Where should I keep my medicine? Keep out of the reach of children. Store at room temperature between 20 to 25 degrees C (68 to 77 degrees F). Throw away any unused medicine after the expiration date. NOTE: This sheet is a summary. It may not cover all possible information. If you have questions about this medicine, talk to your doctor, pharmacist, or health care provider.  2017 Elsevier/Gold Standard (2011-01-06 [id number])

## 2016-02-13 NOTE — Assessment & Plan Note (Signed)
BP today 135/77 which is at goal.   Plan  Continue amlodipine, lisinopril, HCTZ CMET for renal function today.

## 2016-02-13 NOTE — Assessment & Plan Note (Signed)
He is following with ophthalmology and getting treatments.  His vision is stable.   Plan Continue to follow with ophtho

## 2016-02-13 NOTE — Assessment & Plan Note (Signed)
DRE/PSA - PSA mildly elevated, following with urology Denies FH of prostate cancer.  Colon cancer: FOBT cards today Flu: today Tetanus: UTD PNA: Prevnar 13 done, discuss pneumovax at next visit Zostavax: Rx given today to have done at pharmacy.

## 2016-02-14 LAB — CMP14 + ANION GAP
A/G RATIO: 1.5 (ref 1.2–2.2)
ALBUMIN: 4.2 g/dL (ref 3.6–4.8)
ALK PHOS: 71 IU/L (ref 39–117)
ALT: 13 IU/L (ref 0–44)
AST: 18 IU/L (ref 0–40)
Anion Gap: 16 mmol/L (ref 10.0–18.0)
BILIRUBIN TOTAL: 0.5 mg/dL (ref 0.0–1.2)
BUN / CREAT RATIO: 19 (ref 10–24)
BUN: 19 mg/dL (ref 8–27)
CHLORIDE: 99 mmol/L (ref 96–106)
CO2: 24 mmol/L (ref 18–29)
Calcium: 9.4 mg/dL (ref 8.6–10.2)
Creatinine, Ser: 1 mg/dL (ref 0.76–1.27)
GFR calc Af Amer: 89 mL/min/{1.73_m2} (ref >59)
GFR calc non Af Amer: 77 mL/min/{1.73_m2} (ref >59)
GLUCOSE: 124 mg/dL — AB (ref 65–99)
Globulin, Total: 2.8 g/dL (ref 1.5–4.5)
POTASSIUM: 3.9 mmol/L (ref 3.5–5.2)
SODIUM: 139 mmol/L (ref 134–144)
Total Protein: 7 g/dL (ref 6.0–8.5)

## 2016-02-20 LAB — TOXASSURE SELECT,+ANTIDEPR,UR

## 2016-03-11 DIAGNOSIS — H33052 Total retinal detachment, left eye: Secondary | ICD-10-CM | POA: Diagnosis not present

## 2016-03-11 DIAGNOSIS — H4312 Vitreous hemorrhage, left eye: Secondary | ICD-10-CM | POA: Diagnosis not present

## 2016-03-24 ENCOUNTER — Other Ambulatory Visit: Payer: Self-pay | Admitting: Internal Medicine

## 2016-03-24 ENCOUNTER — Other Ambulatory Visit: Payer: Self-pay | Admitting: *Deleted

## 2016-03-24 NOTE — Telephone Encounter (Signed)
Pain med refill °

## 2016-03-25 MED ORDER — HYDROCODONE-ACETAMINOPHEN 10-325 MG PO TABS: 1.0000 | ORAL_TABLET | Freq: Three times a day (TID) | ORAL | 0 refills | Status: DC | PRN

## 2016-03-25 NOTE — Telephone Encounter (Signed)
Refill has been requested 

## 2016-04-17 ENCOUNTER — Encounter: Payer: Self-pay | Admitting: *Deleted

## 2016-04-23 DIAGNOSIS — H3342 Traction detachment of retina, left eye: Secondary | ICD-10-CM | POA: Diagnosis not present

## 2016-04-23 DIAGNOSIS — E113522 Type 2 diabetes mellitus with proliferative diabetic retinopathy with traction retinal detachment involving the macula, left eye: Secondary | ICD-10-CM | POA: Diagnosis not present

## 2016-06-03 ENCOUNTER — Ambulatory Visit: Payer: Medicare Other | Admitting: Internal Medicine

## 2016-06-09 ENCOUNTER — Ambulatory Visit (INDEPENDENT_AMBULATORY_CARE_PROVIDER_SITE_OTHER): Payer: Medicare Other | Admitting: Internal Medicine

## 2016-06-09 ENCOUNTER — Encounter: Payer: Self-pay | Admitting: Internal Medicine

## 2016-06-09 VITALS — BP 118/76 | HR 56 | Temp 97.8°F | Ht 73.0 in | Wt 223.2 lb

## 2016-06-09 DIAGNOSIS — Z23 Encounter for immunization: Secondary | ICD-10-CM | POA: Diagnosis not present

## 2016-06-09 DIAGNOSIS — Z79891 Long term (current) use of opiate analgesic: Secondary | ICD-10-CM | POA: Diagnosis not present

## 2016-06-09 DIAGNOSIS — M48061 Spinal stenosis, lumbar region without neurogenic claudication: Secondary | ICD-10-CM

## 2016-06-09 DIAGNOSIS — N400 Enlarged prostate without lower urinary tract symptoms: Secondary | ICD-10-CM

## 2016-06-09 DIAGNOSIS — I10 Essential (primary) hypertension: Secondary | ICD-10-CM

## 2016-06-09 DIAGNOSIS — G8929 Other chronic pain: Secondary | ICD-10-CM | POA: Diagnosis not present

## 2016-06-09 DIAGNOSIS — M545 Low back pain: Secondary | ICD-10-CM

## 2016-06-09 DIAGNOSIS — Z79899 Other long term (current) drug therapy: Secondary | ICD-10-CM

## 2016-06-09 DIAGNOSIS — H348122 Central retinal vein occlusion, left eye, stable: Secondary | ICD-10-CM | POA: Diagnosis not present

## 2016-06-09 DIAGNOSIS — M5136 Other intervertebral disc degeneration, lumbar region: Secondary | ICD-10-CM

## 2016-06-09 DIAGNOSIS — R739 Hyperglycemia, unspecified: Secondary | ICD-10-CM

## 2016-06-09 DIAGNOSIS — M546 Pain in thoracic spine: Secondary | ICD-10-CM

## 2016-06-09 LAB — POCT GLYCOSYLATED HEMOGLOBIN (HGB A1C): HEMOGLOBIN A1C: 5.3

## 2016-06-09 LAB — GLUCOSE, CAPILLARY: GLUCOSE-CAPILLARY: 136 mg/dL — AB (ref 65–99)

## 2016-06-09 MED ORDER — CYCLOBENZAPRINE HCL 7.5 MG PO TABS: 7.5000 mg | ORAL_TABLET | Freq: Every evening | ORAL | 0 refills | Status: DC | PRN

## 2016-06-09 MED ORDER — HYDROCHLOROTHIAZIDE 25 MG PO TABS: ORAL_TABLET | ORAL | 3 refills | Status: DC

## 2016-06-09 MED ORDER — HYDROCODONE-ACETAMINOPHEN 10-325 MG PO TABS: 1.0000 | ORAL_TABLET | Freq: Three times a day (TID) | ORAL | 0 refills | Status: DC | PRN

## 2016-06-09 MED ORDER — ATORVASTATIN CALCIUM 10 MG PO TABS: 10.0000 mg | ORAL_TABLET | Freq: Every day | ORAL | 3 refills | Status: DC

## 2016-06-09 MED ORDER — LISINOPRIL 10 MG PO TABS: ORAL_TABLET | ORAL | 3 refills | Status: DC

## 2016-06-09 MED ORDER — TAMSULOSIN HCL 0.4 MG PO CAPS: ORAL_CAPSULE | ORAL | 3 refills | Status: DC

## 2016-06-09 MED ORDER — AMLODIPINE BESYLATE 10 MG PO TABS: ORAL_TABLET | ORAL | 3 refills | Status: DC

## 2016-06-09 NOTE — Patient Instructions (Addendum)
Mr. Dhondt - -   It was a pleasure to see you today!  For your back pain -   Please try cyclobenzaprine 7.5mg  at night.  More information about this medication is below.  This medication can make you sleepy.  If you feel the dose is too high, please call the clinic and I will send you a lower dose.   Please return to physical therapy for exercises that may help.   Please come back to see me in 1 month to see how you are doing.  (on or around May 9)  Thank you!  Cyclobenzaprine tablets What is this medicine? CYCLOBENZAPRINE (sye kloe BEN za preen) is a muscle relaxer. It is used to treat muscle pain, spasms, and stiffness. This medicine may be used for other purposes; ask your health care provider or pharmacist if you have questions. COMMON BRAND NAME(S): Fexmid, Flexeril What should I tell my health care provider before I take this medicine? They need to know if you have any of these conditions: -heart disease, irregular heartbeat, or previous heart attack -liver disease -thyroid problem -an unusual or allergic reaction to cyclobenzaprine, tricyclic antidepressants, lactose, other medicines, foods, dyes, or preservatives -pregnant or trying to get pregnant -breast-feeding How should I use this medicine? Take this medicine by mouth with a glass of water. Follow the directions on the prescription label. If this medicine upsets your stomach, take it with food or milk. Take your medicine at regular intervals. Do not take it more often than directed. Talk to your pediatrician regarding the use of this medicine in children. Special care may be needed. Overdosage: If you think you have taken too much of this medicine contact a poison control center or emergency room at once. NOTE: This medicine is only for you. Do not share this medicine with others. What if I miss a dose? If you miss a dose, take it as soon as you can. If it is almost time for your next dose, take only that dose. Do not  take double or extra doses. What may interact with this medicine? Do not take this medicine with any of the following medications: -certain medicines for fungal infections like fluconazole, itraconazole, ketoconazole, posaconazole, voriconazole -cisapride -dofetilide -dronedarone -halofantrine -levomethadyl -MAOIs like Carbex, Eldepryl, Marplan, Nardil, and Parnate -narcotic medicines for cough -pimozide -thioridazine -ziprasidone This medicine may also interact with the following medications: -alcohol -antihistamines for allergy, cough and cold -certain medicines for anxiety or sleep -certain medicines for cancer -certain medicines for depression like amitriptyline, fluoxetine, sertraline -certain medicines for infection like alfuzosin, chloroquine, clarithromycin, levofloxacin, mefloquine, pentamidine, troleandomycin -certain medicines for irregular heart beat -certain medicines for seizures like phenobarbital, primidone -contrast dyes -general anesthetics like halothane, isoflurane, methoxyflurane, propofol -local anesthetics like lidocaine, pramoxine, tetracaine -medicines that relax muscles for surgery -narcotic medicines for pain -other medicines that prolong the QT interval (cause an abnormal heart rhythm) -phenothiazines like chlorpromazine, mesoridazine, prochlorperazine This list may not describe all possible interactions. Give your health care provider a list of all the medicines, herbs, non-prescription drugs, or dietary supplements you use. Also tell them if you smoke, drink alcohol, or use illegal drugs. Some items may interact with your medicine. What should I watch for while using this medicine? Tell your doctor or health care professional if your symptoms do not start to get better or if they get worse. You may get drowsy or dizzy. Do not drive, use machinery, or do anything that needs mental alertness until you know how  this medicine affects you. Do not stand or sit  up quickly, especially if you are an older patient. This reduces the risk of dizzy or fainting spells. Alcohol may interfere with the effect of this medicine. Avoid alcoholic drinks. If you are taking another medicine that also causes drowsiness, you may have more side effects. Give your health care provider a list of all medicines you use. Your doctor will tell you how much medicine to take. Do not take more medicine than directed. Call emergency for help if you have problems breathing or unusual sleepiness. Your mouth may get dry. Chewing sugarless gum or sucking hard candy, and drinking plenty of water may help. Contact your doctor if the problem does not go away or is severe. What side effects may I notice from receiving this medicine? Side effects that you should report to your doctor or health care professional as soon as possible: -allergic reactions like skin rash, itching or hives, swelling of the face, lips, or tongue -breathing problems -chest pain -fast, irregular heartbeat -hallucinations -seizures -unusually weak or tired Side effects that usually do not require medical attention (report to your doctor or health care professional if they continue or are bothersome): -headache -nausea, vomiting This list may not describe all possible side effects. Call your doctor for medical advice about side effects. You may report side effects to FDA at 1-800-FDA-1088. Where should I keep my medicine? Keep out of the reach of children. Store at room temperature between 15 and 30 degrees C (59 and 86 degrees F). Keep container tightly closed. Throw away any unused medicine after the expiration date. NOTE: This sheet is a summary. It may not cover all possible information. If you have questions about this medicine, talk to your doctor, pharmacist, or health care provider.  2018 Elsevier/Gold Standard (2014-11-27 12:05:46)

## 2016-06-09 NOTE — Assessment & Plan Note (Addendum)
Patient with worsening back pain reported today and spasm like issues.  He is not having any red flag issues.  We discussed different pain medications, injectable medications and surgery.    Plan PT therapy Trial of cyclobenzaprine at night.  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   Continue hydrocodone.   We discussed the nature of addiction and tolerance.

## 2016-06-09 NOTE — Assessment & Plan Note (Signed)
Given his BP and elevated glucose on repeat BMETs, checked A1C today which was < 5.7

## 2016-06-09 NOTE — Assessment & Plan Note (Addendum)
BP improved today to 118/76 which is at goal of < 130/80.  He is taking his medications without issue  Plan Continue lisinopril 10mg , hctz 25mg  and amlodipine 10mg    He is on atorvastatin for primary prevention.  Check lipid panel and CMET today.

## 2016-06-09 NOTE — Progress Notes (Signed)
   Subjective:    Patient ID: Jeremy Hill, male    DOB: June 30, 1945, 71 y.o.   MRN: [id number]  CC: 4 month follow up after starting a statin  HPI   Jeremy Hill is a 71yo man with PMH of HTN, BPH, chronic pain.  At last visit, his ASCVD risk was noted be 18% and he was started on a mod intensity statin for primary prevention of ASCVD.   Jeremy Hill reports that he is having worsening back pain.  Over the last few weeks he has been getting more stiff, particularly at night.  He will have trouble sleeping and getting in a comfortable position.  He feels like the pain is mainly in his low back, and radiates to his sides and sometimes he has spasms.  He will occasionally have pain shooting down his legs, but this does not bother him.  He does wake up with some morning stiffness that gets better.  He sometimes feels like his legs are stuck, or catching.  He does not have hip or knee pain.  He tries to exercise, but the pain is sometimes so bad he has to rest.  Rest does improve the pain.  Also improved with his hydrocodone, but he feels this isn't working very well.  He one time missed his hydrocodone for 24 hours and took 4 pills at once that made him very groggy.  He had an MRI of the L spine in 2006 which showed mild stenosis and disc disease.  He has no red flags including fever, chills, numbness, weakness of the legs, loss of bowel or bladder.  He has had PT in the past with some improvement in his pain.   He did start taking his atorvastatin and is not having any issues with it.  He notes no ADE or nausea.   He is very concerned about being "addicted" to hydrocodone as he has seen this on the TV.  We discussed the difference between tolerance and addiction today.     Review of Systems  Constitutional: Negative for chills, fatigue, fever and unexpected weight change.  Eyes: Positive for discharge (clear) and visual disturbance (improving). Negative for photophobia.  Respiratory: Negative for cough  and shortness of breath.   Gastrointestinal: Negative for diarrhea and rectal pain.  Genitourinary: Negative for difficulty urinating and dysuria.  Musculoskeletal: Positive for arthralgias, back pain and gait problem. Negative for joint swelling.  Skin: Positive for rash (chronic from psoriasis).  Psychiatric/Behavioral: Positive for dysphoric mood (worried about his back pain).       Objective:   Physical Exam  Constitutional: He is oriented to person, place, and time. He appears well-developed and well-nourished. No distress.  HENT:  Head: Normocephalic and atraumatic.  Eyes:  He has left eye tearing  Pulmonary/Chest: Effort normal. He exhibits no tenderness.  Abdominal: Soft. He exhibits no distension. There is no tenderness.  Musculoskeletal: He exhibits tenderness (low back, paraspinal, up into the thoracic region). He exhibits no edema.  Negative SLR bilaterally  Neurological: He is alert and oriented to person, place, and time.  Psychiatric: His behavior is normal.  He is somewhat down about his back pain today.     CMET, lipid panel, A1C today.      Assessment & Plan:  RTC in 1 month, sooner if needed.

## 2016-06-09 NOTE — Assessment & Plan Note (Signed)
Reports his symptoms are much improved with tamsulosin.   Plan Continue tamsulosin Continue Urology follow up PRN

## 2016-06-09 NOTE — Assessment & Plan Note (Signed)
Seeing ophthalmology and on 4 eye drops. Reports that his vision is improving.   Plan Continue eye drops and ophthalmology follow up

## 2016-06-10 LAB — CMP14 + ANION GAP
ALK PHOS: 62 IU/L (ref 39–117)
ALT: 12 IU/L (ref 0–44)
ANION GAP: 13 mmol/L (ref 10.0–18.0)
AST: 20 IU/L (ref 0–40)
Albumin/Globulin Ratio: 1.5 (ref 1.2–2.2)
Albumin: 4.1 g/dL (ref 3.6–4.8)
BUN/Creatinine Ratio: 17 (ref 10–24)
BUN: 18 mg/dL (ref 8–27)
Bilirubin Total: 0.6 mg/dL (ref 0.0–1.2)
CO2: 28 mmol/L (ref 18–29)
CREATININE: 1.08 mg/dL (ref 0.76–1.27)
Calcium: 9.4 mg/dL (ref 8.6–10.2)
Chloride: 98 mmol/L (ref 96–106)
GFR calc Af Amer: 81 mL/min/{1.73_m2} (ref >59)
GFR calc non Af Amer: 70 mL/min/{1.73_m2} (ref >59)
GLOBULIN, TOTAL: 2.8 g/dL (ref 1.5–4.5)
GLUCOSE: 136 mg/dL — AB (ref 65–99)
Potassium: 4 mmol/L (ref 3.5–5.2)
SODIUM: 139 mmol/L (ref 134–144)
Total Protein: 6.9 g/dL (ref 6.0–8.5)

## 2016-06-10 LAB — LIPID PANEL
CHOLESTEROL TOTAL: 138 mg/dL (ref 100–199)
Chol/HDL Ratio: 2.7 ratio (ref 0.0–5.0)
HDL: 51 mg/dL (ref >39)
LDL CALC: 79 mg/dL (ref 0–99)
TRIGLYCERIDES: 42 mg/dL (ref 0–149)
VLDL Cholesterol Cal: 8 mg/dL (ref 5–40)

## 2016-06-12 ENCOUNTER — Encounter: Payer: Self-pay | Admitting: Internal Medicine

## 2016-06-17 ENCOUNTER — Ambulatory Visit: Payer: Medicare Other | Admitting: Internal Medicine

## 2016-06-17 DIAGNOSIS — H10022 Other mucopurulent conjunctivitis, left eye: Secondary | ICD-10-CM | POA: Diagnosis not present

## 2016-07-07 DIAGNOSIS — H401131 Primary open-angle glaucoma, bilateral, mild stage: Secondary | ICD-10-CM | POA: Diagnosis not present

## 2016-07-07 DIAGNOSIS — H35372 Puckering of macula, left eye: Secondary | ICD-10-CM | POA: Diagnosis not present

## 2016-07-07 DIAGNOSIS — H34812 Central retinal vein occlusion, left eye, with macular edema: Secondary | ICD-10-CM | POA: Diagnosis not present

## 2016-07-07 DIAGNOSIS — H2513 Age-related nuclear cataract, bilateral: Secondary | ICD-10-CM | POA: Diagnosis not present

## 2016-07-08 ENCOUNTER — Ambulatory Visit (INDEPENDENT_AMBULATORY_CARE_PROVIDER_SITE_OTHER): Payer: Medicare Other | Admitting: Internal Medicine

## 2016-07-08 ENCOUNTER — Encounter: Payer: Self-pay | Admitting: Internal Medicine

## 2016-07-08 DIAGNOSIS — H348122 Central retinal vein occlusion, left eye, stable: Secondary | ICD-10-CM

## 2016-07-08 DIAGNOSIS — I10 Essential (primary) hypertension: Secondary | ICD-10-CM | POA: Diagnosis not present

## 2016-07-08 DIAGNOSIS — Z79899 Other long term (current) drug therapy: Secondary | ICD-10-CM | POA: Diagnosis not present

## 2016-07-08 DIAGNOSIS — M48061 Spinal stenosis, lumbar region without neurogenic claudication: Secondary | ICD-10-CM

## 2016-07-08 DIAGNOSIS — Z0000 Encounter for general adult medical examination without abnormal findings: Secondary | ICD-10-CM

## 2016-07-08 DIAGNOSIS — M4800 Spinal stenosis, site unspecified: Secondary | ICD-10-CM

## 2016-07-08 DIAGNOSIS — M546 Pain in thoracic spine: Secondary | ICD-10-CM

## 2016-07-08 MED ORDER — CYCLOBENZAPRINE HCL 7.5 MG PO TABS: 7.5000 mg | ORAL_TABLET | Freq: Every evening | ORAL | 0 refills | Status: DC | PRN

## 2016-07-08 MED ORDER — AMLODIPINE BESYLATE 10 MG PO TABS: ORAL_TABLET | ORAL | 3 refills | Status: DC

## 2016-07-08 MED ORDER — ATORVASTATIN CALCIUM 10 MG PO TABS: 10.0000 mg | ORAL_TABLET | Freq: Every day | ORAL | 3 refills | Status: DC

## 2016-07-08 MED ORDER — HYDROCHLOROTHIAZIDE 25 MG PO TABS: ORAL_TABLET | ORAL | 3 refills | Status: DC

## 2016-07-08 MED ORDER — LISINOPRIL 10 MG PO TABS: ORAL_TABLET | ORAL | 3 refills | Status: DC

## 2016-07-08 MED ORDER — MELOXICAM 7.5 MG PO TABS: ORAL_TABLET | ORAL | 2 refills | Status: DC

## 2016-07-08 NOTE — Assessment & Plan Note (Signed)
BP is elevated today, he reports adherance to some of his medication regimen, but he has been out of some of his medications due to his living situation.  He would like new refills sent to his pharmacy which I did today.   Plan Continue lisinopril, amlodipine, HCTZ.  Follow up in 3 months.
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
## 2016-07-08 NOTE — Assessment & Plan Note (Signed)
Jeremy Hill is following up with ophthalmology regularly.  He is taking multiple eye drops.  He has been having some recovery, but may need another surgery.   Plan Follow up with ophthalmology as planned.

## 2016-07-08 NOTE — Patient Instructions (Signed)
Jeremy Hill - -   Thank you for coming in today.  I have sent all of your prescriptions to your requested pharmacy.    For your back pain, please start taking he cyclobenzaprine at night only to see if this helps.    Please call the clinic if there is anything we can do to be of further help during this difficult time.   Thank you!  Cyclobenzaprine tablets What is this medicine? CYCLOBENZAPRINE (sye kloe BEN za preen) is a muscle relaxer. It is used to treat muscle pain, spasms, and stiffness. This medicine may be used for other purposes; ask your health care provider or pharmacist if you have questions. COMMON BRAND NAME(S): Fexmid, Flexeril What should I tell my health care provider before I take this medicine? They need to know if you have any of these conditions: -heart disease, irregular heartbeat, or previous heart attack -liver disease -thyroid problem -an unusual or allergic reaction to cyclobenzaprine, tricyclic antidepressants, lactose, other medicines, foods, dyes, or preservatives -pregnant or trying to get pregnant -breast-feeding How should I use this medicine? Take this medicine by mouth with a glass of water. Follow the directions on the prescription label. If this medicine upsets your stomach, take it with food or milk. Take your medicine at regular intervals. Do not take it more often than directed. Talk to your pediatrician regarding the use of this medicine in children. Special care may be needed. Overdosage: If you think you have taken too much of this medicine contact a poison control center or emergency room at once. NOTE: This medicine is only for you. Do not share this medicine with others. What if I miss a dose? If you miss a dose, take it as soon as you can. If it is almost time for your next dose, take only that dose. Do not take double or extra doses. What may interact with this medicine? Do not take this medicine with any of the following  medications: -certain medicines for fungal infections like fluconazole, itraconazole, ketoconazole, posaconazole, voriconazole -cisapride -dofetilide -dronedarone -halofantrine -levomethadyl -MAOIs like Carbex, Eldepryl, Marplan, Nardil, and Parnate -narcotic medicines for cough -pimozide -thioridazine -ziprasidone This medicine may also interact with the following medications: -alcohol -antihistamines for allergy, cough and cold -certain medicines for anxiety or sleep -certain medicines for cancer -certain medicines for depression like amitriptyline, fluoxetine, sertraline -certain medicines for infection like alfuzosin, chloroquine, clarithromycin, levofloxacin, mefloquine, pentamidine, troleandomycin -certain medicines for irregular heart beat -certain medicines for seizures like phenobarbital, primidone -contrast dyes -general anesthetics like halothane, isoflurane, methoxyflurane, propofol -local anesthetics like lidocaine, pramoxine, tetracaine -medicines that relax muscles for surgery -narcotic medicines for pain -other medicines that prolong the QT interval (cause an abnormal heart rhythm) -phenothiazines like chlorpromazine, mesoridazine, prochlorperazine This list may not describe all possible interactions. Give your health care provider a list of all the medicines, herbs, non-prescription drugs, or dietary supplements you use. Also tell them if you smoke, drink alcohol, or use illegal drugs. Some items may interact with your medicine. What should I watch for while using this medicine? Tell your doctor or health care professional if your symptoms do not start to get better or if they get worse. You may get drowsy or dizzy. Do not drive, use machinery, or do anything that needs mental alertness until you know how this medicine affects you. Do not stand or sit up quickly, especially if you are an older patient. This reduces the risk of dizzy or fainting spells. Alcohol may  interfere  with the effect of this medicine. Avoid alcoholic drinks. If you are taking another medicine that also causes drowsiness, you may have more side effects. Give your health care provider a list of all medicines you use. Your doctor will tell you how much medicine to take. Do not take more medicine than directed. Call emergency for help if you have problems breathing or unusual sleepiness. Your mouth may get dry. Chewing sugarless gum or sucking hard candy, and drinking plenty of water may help. Contact your doctor if the problem does not go away or is severe. What side effects may I notice from receiving this medicine? Side effects that you should report to your doctor or health care professional as soon as possible: -allergic reactions like skin rash, itching or hives, swelling of the face, lips, or tongue -breathing problems -chest pain -fast, irregular heartbeat -hallucinations -seizures -unusually weak or tired Side effects that usually do not require medical attention (report to your doctor or health care professional if they continue or are bothersome): -headache -nausea, vomiting This list may not describe all possible side effects. Call your doctor for medical advice about side effects. You may report side effects to FDA at 1-800-FDA-1088. Where should I keep my medicine? Keep out of the reach of children. Store at room temperature between 15 and 30 degrees C (59 and 86 degrees F). Keep container tightly closed. Throw away any unused medicine after the expiration date. NOTE: This sheet is a summary. It may not cover all possible information. If you have questions about this medicine, talk to your doctor, pharmacist, or health care provider.  2018 Elsevier/Gold Standard (2014-11-27 12:05:46)
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
## 2016-07-08 NOTE — Assessment & Plan Note (Signed)
HCV screening at next visit.   Remind about stool cards once his living situation is more stable.

## 2016-07-17 NOTE — Addendum Note (Signed)
Addended by: Hulan Fray on: 07/17/2016 06:46 AM   Modules accepted: Orders

## 2016-08-10 DIAGNOSIS — H35372 Puckering of macula, left eye: Secondary | ICD-10-CM | POA: Diagnosis not present

## 2016-09-22 ENCOUNTER — Other Ambulatory Visit: Payer: Self-pay

## 2016-09-22 DIAGNOSIS — M546 Pain in thoracic spine: Secondary | ICD-10-CM

## 2016-09-22 MED ORDER — HYDROCODONE-ACETAMINOPHEN 10-325 MG PO TABS: 1.0000 | ORAL_TABLET | Freq: Three times a day (TID) | ORAL | 0 refills | Status: DC | PRN

## 2016-09-22 NOTE — Telephone Encounter (Signed)
HYDROcodone-acetaminophen (NORCO) 10-325 MG tablet, refill request.

## 2016-09-23 NOTE — Telephone Encounter (Signed)
Pt informed-rx ready for pick up .Jeremy Hidden Cassady7/25/20189:31 AM

## 2016-10-21 ENCOUNTER — Encounter: Payer: Medicare Other | Admitting: Internal Medicine

## 2016-12-16 ENCOUNTER — Ambulatory Visit (INDEPENDENT_AMBULATORY_CARE_PROVIDER_SITE_OTHER): Payer: Medicare Other | Admitting: Internal Medicine

## 2016-12-16 ENCOUNTER — Encounter: Payer: Self-pay | Admitting: Internal Medicine

## 2016-12-16 VITALS — BP 193/118 | HR 64 | Temp 98.5°F | Wt 219.2 lb

## 2016-12-16 DIAGNOSIS — M79604 Pain in right leg: Secondary | ICD-10-CM

## 2016-12-16 DIAGNOSIS — M79605 Pain in left leg: Secondary | ICD-10-CM | POA: Diagnosis not present

## 2016-12-16 DIAGNOSIS — M48061 Spinal stenosis, lumbar region without neurogenic claudication: Secondary | ICD-10-CM

## 2016-12-16 DIAGNOSIS — Z79899 Other long term (current) drug therapy: Secondary | ICD-10-CM | POA: Diagnosis not present

## 2016-12-16 DIAGNOSIS — L409 Psoriasis, unspecified: Secondary | ICD-10-CM

## 2016-12-16 DIAGNOSIS — H348122 Central retinal vein occlusion, left eye, stable: Secondary | ICD-10-CM

## 2016-12-16 DIAGNOSIS — F4320 Adjustment disorder, unspecified: Secondary | ICD-10-CM | POA: Insufficient documentation

## 2016-12-16 DIAGNOSIS — N400 Enlarged prostate without lower urinary tract symptoms: Secondary | ICD-10-CM | POA: Diagnosis not present

## 2016-12-16 DIAGNOSIS — Z0000 Encounter for general adult medical examination without abnormal findings: Secondary | ICD-10-CM

## 2016-12-16 DIAGNOSIS — R972 Elevated prostate specific antigen [PSA]: Secondary | ICD-10-CM

## 2016-12-16 DIAGNOSIS — M4800 Spinal stenosis, site unspecified: Secondary | ICD-10-CM

## 2016-12-16 DIAGNOSIS — Z79891 Long term (current) use of opiate analgesic: Secondary | ICD-10-CM | POA: Diagnosis not present

## 2016-12-16 DIAGNOSIS — Z23 Encounter for immunization: Secondary | ICD-10-CM | POA: Diagnosis not present

## 2016-12-16 DIAGNOSIS — Z809 Family history of malignant neoplasm, unspecified: Secondary | ICD-10-CM

## 2016-12-16 DIAGNOSIS — I10 Essential (primary) hypertension: Secondary | ICD-10-CM | POA: Diagnosis not present

## 2016-12-16 DIAGNOSIS — M79606 Pain in leg, unspecified: Secondary | ICD-10-CM

## 2016-12-16 DIAGNOSIS — F4321 Adjustment disorder with depressed mood: Secondary | ICD-10-CM | POA: Insufficient documentation

## 2016-12-16 DIAGNOSIS — M546 Pain in thoracic spine: Secondary | ICD-10-CM

## 2016-12-16 MED ORDER — AMLODIPINE BESYLATE 10 MG PO TABS: ORAL_TABLET | ORAL | 3 refills | Status: DC

## 2016-12-16 MED ORDER — HYDROCODONE-ACETAMINOPHEN 10-325 MG PO TABS: 1.0000 | ORAL_TABLET | Freq: Three times a day (TID) | ORAL | 0 refills | Status: DC | PRN

## 2016-12-16 MED ORDER — LISINOPRIL 10 MG PO TABS: ORAL_TABLET | ORAL | 3 refills | Status: DC

## 2016-12-16 MED ORDER — HYDROCHLOROTHIAZIDE 25 MG PO TABS: ORAL_TABLET | ORAL | 3 refills | Status: DC

## 2016-12-16 NOTE — Assessment & Plan Note (Signed)
Pain is well controlled on a stable dose of hydrocone-acetaminphen.  He is able to exercise and perform his daily routine on this medication.  He does not have any sedation or constipation as noted today.  He is on a pain contract.  [place] narcotic database reviewed and appropriate.  Last UDS in December 2017 was appropriate.    Plan Continue current therapy - hydrocodone-acetaminophen 10/325 TID PRN for pain #90.  Refill X 3 months given today.

## 2016-12-16 NOTE — Patient Instructions (Signed)
Mr. Fatzinger - -  Thank you for coming in to see me today.   For your high blood pressure, please start trying to take your medications as prescribed.  Also, starting back exercising at the [facility] would be a great benefit to your blood pressure and mood.   Please call me if your mood were to worsen and you feel you need extra help and we will see you in the clinic.   Thank you!  For your chronic medical conditions, come back in 3 months.  Otherwise, call if you need to be seen sooner.

## 2016-12-16 NOTE — Assessment & Plan Note (Signed)
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  He seems to be taking the change in stride and trying to adapt.  He has other stressors as well.   Plan Continue follow up with ophthalmology and eye drops as prescribed.

## 2016-12-16 NOTE — Assessment & Plan Note (Signed)
Currently no issues on tamsulosin.  He is following with [facility] for PSA monitoring.   Plan Continue tamsulosin Continue follow up with Urology.

## 2016-12-16 NOTE — Assessment & Plan Note (Signed)
Currently he has some stiffness in the legs, but he has stopped exercising.  We discussed this and he plans to get back to the [facility] to exercise.  He is on chronic pain medication for spinal stenosis which does help.   Plan Resume exercise routine.
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
## 2016-12-16 NOTE — Assessment & Plan Note (Signed)
Flu given today FIT test given out today for him to return HCV screen today.

## 2016-12-16 NOTE — Assessment & Plan Note (Signed)
He is having a mild flare.  He uses tar soap and a cream for which he cannot remember the name.  He has chronic changes.    Plan Continue current therapy.

## 2016-12-16 NOTE — Assessment & Plan Note (Signed)
He has had recent change in vision for which he must adapt as well as death of his sister and a friend which was a surprise.  He is dealing with his grief in a normal fashion at this point and noted good support in his community.  He does not have SI or HI.  We discussed at length the signs of worsening or prolonged grief and depression and he will come back to clinic if things worsen.   Plan Monitor, continue supportive care Consider medication or counseling in future if needed.

## 2016-12-16 NOTE — Assessment & Plan Note (Signed)
BP is very high today.  He has not taken his medications regularly in weeks.  He has had significant life stressors which have led him to prioritize other matters over taking his medications.  We discussed this today and I advised him to start exercising again and taking his medications.  He seems motivated to do this.    Plan Continue current therapy with amlodipine, lisinopril, hctz.  I asked him to bring in his pills at next visit.

## 2016-12-17 ENCOUNTER — Encounter: Payer: Self-pay | Admitting: Internal Medicine

## 2016-12-17 ENCOUNTER — Telehealth: Payer: Self-pay | Admitting: Internal Medicine

## 2016-12-17 DIAGNOSIS — Z0000 Encounter for general adult medical examination without abnormal findings: Secondary | ICD-10-CM | POA: Diagnosis not present

## 2016-12-17 LAB — HEPATITIS C ANTIBODY: Hep C Virus Ab: <0.1 s/co ratio (ref 0.0–0.9)

## 2016-12-17 NOTE — Telephone Encounter (Signed)
Called to inform Jeremy Hill of his negative hepatitis C test.  Answered all questions.

## 2016-12-23 ENCOUNTER — Other Ambulatory Visit: Payer: Medicare Other

## 2016-12-23 DIAGNOSIS — Z0000 Encounter for general adult medical examination without abnormal findings: Secondary | ICD-10-CM

## 2016-12-29 LAB — FECAL OCCULT BLOOD, IMMUNOCHEMICAL: Fecal Occult Bld: NEGATIVE

## 2016-12-30 ENCOUNTER — Encounter: Payer: Self-pay | Admitting: Internal Medicine

## 2017-03-17 ENCOUNTER — Ambulatory Visit (INDEPENDENT_AMBULATORY_CARE_PROVIDER_SITE_OTHER): Payer: Medicare Other | Admitting: Internal Medicine

## 2017-03-17 ENCOUNTER — Encounter: Payer: Self-pay | Admitting: Internal Medicine

## 2017-03-17 ENCOUNTER — Other Ambulatory Visit: Payer: Self-pay

## 2017-03-17 DIAGNOSIS — N401 Enlarged prostate with lower urinary tract symptoms: Secondary | ICD-10-CM | POA: Diagnosis not present

## 2017-03-17 DIAGNOSIS — M48061 Spinal stenosis, lumbar region without neurogenic claudication: Secondary | ICD-10-CM

## 2017-03-17 DIAGNOSIS — R351 Nocturia: Secondary | ICD-10-CM | POA: Diagnosis not present

## 2017-03-17 DIAGNOSIS — I10 Essential (primary) hypertension: Secondary | ICD-10-CM

## 2017-03-17 DIAGNOSIS — G8929 Other chronic pain: Secondary | ICD-10-CM | POA: Diagnosis not present

## 2017-03-17 DIAGNOSIS — L538 Other specified erythematous conditions: Secondary | ICD-10-CM | POA: Diagnosis not present

## 2017-03-17 DIAGNOSIS — M546 Pain in thoracic spine: Secondary | ICD-10-CM

## 2017-03-17 DIAGNOSIS — E7800 Pure hypercholesterolemia, unspecified: Secondary | ICD-10-CM

## 2017-03-17 DIAGNOSIS — H348122 Central retinal vein occlusion, left eye, stable: Secondary | ICD-10-CM | POA: Diagnosis not present

## 2017-03-17 DIAGNOSIS — E785 Hyperlipidemia, unspecified: Secondary | ICD-10-CM

## 2017-03-17 DIAGNOSIS — N400 Enlarged prostate without lower urinary tract symptoms: Secondary | ICD-10-CM

## 2017-03-17 DIAGNOSIS — M4800 Spinal stenosis, site unspecified: Secondary | ICD-10-CM

## 2017-03-17 DIAGNOSIS — L409 Psoriasis, unspecified: Secondary | ICD-10-CM | POA: Diagnosis not present

## 2017-03-17 DIAGNOSIS — Z79899 Other long term (current) drug therapy: Secondary | ICD-10-CM

## 2017-03-17 DIAGNOSIS — Z0000 Encounter for general adult medical examination without abnormal findings: Secondary | ICD-10-CM

## 2017-03-17 MED ORDER — TAMSULOSIN HCL 0.4 MG PO CAPS: ORAL_CAPSULE | ORAL | 3 refills | Status: DC

## 2017-03-17 MED ORDER — ATORVASTATIN CALCIUM 10 MG PO TABS: 10.0000 mg | ORAL_TABLET | Freq: Every day | ORAL | 3 refills | Status: DC

## 2017-03-17 MED ORDER — HYDROCODONE-ACETAMINOPHEN 10-325 MG PO TABS: 1.0000 | ORAL_TABLET | Freq: Three times a day (TID) | ORAL | 0 refills | Status: DC | PRN

## 2017-03-17 NOTE — Patient Instructions (Addendum)
[name] - -  Thank you for coming in to see me today.  You are doing well and I would continue to take all of your medications as you are.    For your blood pressure: please keep a log of your blood pressure at home using your home cuff.  Please call me in about 2 weeks with this log and we will see if your blood pressure is on the rise.  If it is higher at home as well, will discuss maybe increasing one of your blood pressure medications.    For the vinegar based remedy you are taking, please let me know the name of it so that I can check if it interacts with your other medications.   Thank you!

## 2017-03-17 NOTE — Assessment & Plan Note (Signed)
He is doing well today.  Has a further surgery planned which should improve his vision further.  He is taking his eye drops and following with ophthalmology.   Plan Continue current therapy.

## 2017-03-17 NOTE — Assessment & Plan Note (Signed)
BP remained elevated today, but was normal at home per report.  He has started back exercising and is taking his medications as prescribed.  I think this might be an outlier.  I have asked him to keep a log of his BP measurements and give me a call in about 2 weeks to see if we need to go up on one or more of his medications.  I also advised him to bring his cuff in for calibration with our machines in future.  He does have vision issues, but denies headaches, chest pain.   Plan Continue amlodipine, hctz, lisinopril BP log for 2 weeks.

## 2017-03-17 NOTE — Assessment & Plan Note (Signed)
Due for Urology follow up for PSA FOBT negative in 11/2016 Never smoker.

## 2017-03-17 NOTE — Assessment & Plan Note (Signed)
Symptoms are better controlled on tamsulosin.  He is still having nocturia 3 times per night, but has no other complaints and no new issues today.  No dysuria.   Plan Continue tamsulosin Reschedule urology appointment.

## 2017-03-17 NOTE — Assessment & Plan Note (Signed)
Skin is worse in the winter.  He is using therapy prescribed to him previously.  He does not like the skin changes, but is used to them.   Plan Continue tar soap therapy.

## 2017-03-17 NOTE — Progress Notes (Signed)
Subjective:    Patient ID: Jeremy Hill, male    DOB: [birth year], 72 y.o.   MRN: [id number]  3 month follow up for HTN  HPI   Mr. 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   Mr. Achor reports that he has had some stress due to dealing with his sisters estate (she passed in September of last year).  But this seems to be improved.  He is very worried about his blood pressure this morning.  He checked it at home and it was okay (cuff turns green for good blood pressure), but it was elevated on check here.  On recheck his BP was 153/100 which was still elevated.  He takes amlodipine, HCTZ, lisinopril and tamsulosin daily.   He has some mild worsening of his back pain today due to the cold weather, but notes that it is manageable.  He has no LE weakness, change in urinary or bowel habits or issues with walking.  He is a very sensitive person and reports that walking in through the hospital made him feel bad for the people who have sick loved ones here.    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  He is due for another surgery which will hopefully restore some of his vision.   He has had h/o elevated PSA and is following with urology.  He missed an appointment due to issues with his sister passing and would like to reschedule.    He has one acute complaint, notes that he feels he is getting a pimple in his left ear.  He denies any other URI symptoms, use of Q tips, anything stuck in his ear, drainage or bleeding.      Review of Systems  Constitutional: Negative for activity change, chills, fatigue and fever.  HENT: Negative for ear discharge, ear pain, rhinorrhea and sinus pain.   Eyes: Positive for visual disturbance.  Respiratory: Negative for cough and shortness of breath.   Cardiovascular: Negative for chest pain and leg swelling.   Genitourinary: Negative for difficulty urinating, dysuria, enuresis and flank pain. Frequency: nocturia X 3 at night.  Musculoskeletal: Positive for arthralgias and back pain. Negative for gait problem and myalgias.  Skin: Positive for rash (psoriasis).  Neurological: Negative for weakness and numbness.       Objective:   Physical Exam  Constitutional: He is oriented to person, place, and time. He appears well-developed and well-nourished. No distress.  HENT:  Head: Normocephalic and atraumatic.  Left EAC showed some mild erythema at the lower part of the canal near the TM.  He had no pimple, drainage, wax or other signs of infection.    Eyes: Conjunctivae are normal. No scleral icterus.  Cardiovascular: Normal rate, regular rhythm and normal heart sounds.  No murmur heard. Pulmonary/Chest: Effort normal and breath sounds normal. No respiratory distress. He has no wheezes.  Abdominal: Soft. Bowel sounds are normal. He exhibits no distension. There is no tenderness.  Neurological: He is alert and oriented to person, place, and time.  Psychiatric: He has a normal mood and affect. His behavior is normal.  Vitals reviewed.   No labs today.       Assessment & Plan:  RTC in 5 months, sooner if needed, to call in for pain medications.   Ear Pain - Exam is non  concerning, may have early external otitis vs dryness.  Advised him of what to watch for if getting infected.  No treatment at this time.

## 2017-03-17 NOTE — Assessment & Plan Note (Signed)
LDL was 131 in 2012 and he has been on atorvastatin.  At last check LDL was < 100.    Plan Continue atorvastatin.

## 2017-03-17 NOTE — Assessment & Plan Note (Signed)
Back pain is manageable for him.  He is on meloxicam, flexeril PRN and PRN hydrocodone.  He has been on a stable dose for a long period of time and this dose helps him participate in exercise programs and do yard work.  He has no red flags for his use.  [place] narcotic database reviewed today and appropriate.  Consider random UDS at next visit.   Plan Refill hydrocodone X 3 today Follow up in 3-5 months

## 2017-04-16 ENCOUNTER — Encounter: Payer: Self-pay | Admitting: Internal Medicine

## 2017-04-16 DIAGNOSIS — E663 Overweight: Secondary | ICD-10-CM | POA: Insufficient documentation

## 2017-06-22 ENCOUNTER — Other Ambulatory Visit: Payer: Self-pay | Admitting: *Deleted

## 2017-06-22 DIAGNOSIS — M546 Pain in thoracic spine: Secondary | ICD-10-CM

## 2017-06-22 DIAGNOSIS — M4800 Spinal stenosis, site unspecified: Secondary | ICD-10-CM

## 2017-06-22 MED ORDER — HYDROCODONE-ACETAMINOPHEN 10-325 MG PO TABS: 1.0000 | ORAL_TABLET | Freq: Three times a day (TID) | ORAL | 0 refills | Status: DC | PRN

## 2017-06-22 NOTE — Telephone Encounter (Signed)
Last rx written 03/17/17. Last OV 03/17/17. Next OV 08/11/17. UDS 02/13/16.

## 2017-06-22 NOTE — Telephone Encounter (Signed)
Record reviewed.  Online database without red flags.  Provided 1 month prescription #90 X 2 to get [name] through until Dr. Daryll Drown returns and can refill as appropriate.

## 2017-08-11 ENCOUNTER — Other Ambulatory Visit: Payer: Self-pay

## 2017-08-11 ENCOUNTER — Ambulatory Visit (INDEPENDENT_AMBULATORY_CARE_PROVIDER_SITE_OTHER): Payer: Medicare Other | Admitting: Internal Medicine

## 2017-08-11 ENCOUNTER — Encounter: Payer: Self-pay | Admitting: Internal Medicine

## 2017-08-11 ENCOUNTER — Encounter (INDEPENDENT_AMBULATORY_CARE_PROVIDER_SITE_OTHER): Payer: Self-pay

## 2017-08-11 VITALS — BP 143/87 | HR 54 | Temp 98.0°F | Ht 73.0 in | Wt 218.2 lb

## 2017-08-11 DIAGNOSIS — Z6825 Body mass index (BMI) 25.0-25.9, adult: Secondary | ICD-10-CM

## 2017-08-11 DIAGNOSIS — N400 Enlarged prostate without lower urinary tract symptoms: Secondary | ICD-10-CM | POA: Diagnosis not present

## 2017-08-11 DIAGNOSIS — M48061 Spinal stenosis, lumbar region without neurogenic claudication: Secondary | ICD-10-CM

## 2017-08-11 DIAGNOSIS — I10 Essential (primary) hypertension: Secondary | ICD-10-CM | POA: Diagnosis not present

## 2017-08-11 DIAGNOSIS — E663 Overweight: Secondary | ICD-10-CM

## 2017-08-11 DIAGNOSIS — M4800 Spinal stenosis, site unspecified: Secondary | ICD-10-CM

## 2017-08-11 MED ORDER — HYDROCODONE-ACETAMINOPHEN 10-325 MG PO TABS: 1.0000 | ORAL_TABLET | Freq: Three times a day (TID) | ORAL | 0 refills | Status: DC | PRN

## 2017-08-11 MED ORDER — LISINOPRIL 20 MG PO TABS: 20.0000 mg | ORAL_TABLET | Freq: Every day | ORAL | 1 refills | Status: DC

## 2017-08-11 MED ORDER — HYDROCHLOROTHIAZIDE 25 MG PO TABS: ORAL_TABLET | ORAL | 3 refills | Status: DC

## 2017-08-11 MED ORDER — AMLODIPINE BESYLATE 10 MG PO TABS: ORAL_TABLET | ORAL | 3 refills | Status: DC

## 2017-08-11 NOTE — Progress Notes (Signed)
   Subjective:    Patient ID: Jeremy Hill, male    DOB: 1945-09-08, 72 y.o.   MRN: [id number]  5 month follow up for HTN.  HPI  Jeremy Hill is a 72 yo man with PMH of HTN, chronic pain and other diagnoses who presents for follow up of his BP.  His BP has been somewhat elevated in the past, today was 143/87 on initial check.  Recheck revealed the same number on manual.    Jeremy Hill reports that he is doing pretty well.  He had an episode of anxiety which lasted a couple of days around an issue with his car, but that has passed and he does not feel any issues right now.  He has persistent back pain, for which hydrocodone helps somewhat.  He reports tylenol and ibuprofen do not help at all.  He has not tried topical therapies.    He has lost about 8 pounds, which I congratulated him on.  He is avoiding fried foods and reading food labels.  He would like to continue losing weight.  Review of Systems  Constitutional: Negative for activity change.  Respiratory: Negative for cough and shortness of breath.   Cardiovascular: Negative for chest pain and leg swelling.  Gastrointestinal: Negative for abdominal pain.  Neurological: Negative for dizziness and weakness.       Objective:   Physical Exam  Constitutional: He is oriented to person, place, and time. He appears well-developed and well-nourished. No distress.  HENT:  Head: Normocephalic and atraumatic.  Cardiovascular: Normal rate, regular rhythm and normal heart sounds.  Pulmonary/Chest: Effort normal and breath sounds normal.  Neurological: He is alert and oriented to person, place, and time.  Skin: Skin is warm and dry.  Psychiatric: He has a normal mood and affect.  Nursing note and vitals reviewed.   BMET today.       Assessment & Plan:  RTC in 4-6 weeks, sooner if needed.

## 2017-08-11 NOTE — Assessment & Plan Note (Signed)
We discussed today.  We discussed his diet at length.  He has lost weight which I congratulated him on.  We also discussed ways to exercise which could help him.

## 2017-08-11 NOTE — Assessment & Plan Note (Signed)
BP today was 143/87.  It has been high over the past few visits and he reports it being high occasionally at home.   Plan Increase lisinopril to 20mg  daily Continue amlodipine 10mg  Continue HCTZ 25mg  daily  BMET today

## 2017-08-11 NOTE — Assessment & Plan Note (Addendum)
He has some worsening of his back pain, aches, pain medications wear off.  He has tried OTC tylenol and ibuprofen without good results.  We discussed OTC topical medications including icy hot patches.  He may benefit from duloxetine given his occasional anxiety like symptoms (PHQ2 = 1 today, will do complete PHQ 9 at next visit) and pain.   Plan Continue current dose of hydrocodone TID, [place] narcotic database appropriate Trial of topical therapy Consider duloxetine at next visit.

## 2017-08-11 NOTE — Patient Instructions (Addendum)
Jeremy Hill - -  Thank you for coming in to see me today!  For your blood pressure, please increase your lisinopril to 20mg  daily.   Come back to see me in about 4-6 weeks for blood pressure recheck.   Thanks!

## 2017-08-12 LAB — BMP8+ANION GAP
Anion Gap: 14 mmol/L (ref 10.0–18.0)
BUN / CREAT RATIO: 16 (ref 10–24)
BUN: 17 mg/dL (ref 8–27)
CO2: 27 mmol/L (ref 20–29)
Calcium: 9.6 mg/dL (ref 8.6–10.2)
Chloride: 101 mmol/L (ref 96–106)
Creatinine, Ser: 1.04 mg/dL (ref 0.76–1.27)
GFR calc Af Amer: 84 mL/min/{1.73_m2} (ref >59)
GFR calc non Af Amer: 73 mL/min/{1.73_m2} (ref >59)
Glucose: 82 mg/dL (ref 65–99)
Potassium: 3.7 mmol/L (ref 3.5–5.2)
Sodium: 142 mmol/L (ref 134–144)

## 2017-08-13 ENCOUNTER — Encounter: Payer: Self-pay | Admitting: Internal Medicine

## 2017-09-03 ENCOUNTER — Telehealth: Payer: Self-pay | Admitting: Internal Medicine

## 2017-11-25 ENCOUNTER — Other Ambulatory Visit: Payer: Self-pay

## 2017-11-25 DIAGNOSIS — M4800 Spinal stenosis, site unspecified: Secondary | ICD-10-CM

## 2017-11-25 MED ORDER — HYDROCODONE-ACETAMINOPHEN 10-325 MG PO TABS: 1.0000 | ORAL_TABLET | Freq: Three times a day (TID) | ORAL | 0 refills | Status: DC | PRN

## 2017-11-25 NOTE — Telephone Encounter (Signed)
Will fill.  Thanks

## 2017-11-25 NOTE — Telephone Encounter (Signed)
Pt is calling back, needs refill on HYDROcodone-acetaminophen ([place]) 10-325 MG tablet at [place], pt contact [phone]

## 2017-11-25 NOTE — Telephone Encounter (Signed)
HYDROcodone-acetaminophen ([place]) 10-325 MG tablet  Refill request @  [facility], [address] AT [address] [phone] (Phone) [phone] (Fax)   Pt would like a call back.

## 2017-12-29 ENCOUNTER — Ambulatory Visit (INDEPENDENT_AMBULATORY_CARE_PROVIDER_SITE_OTHER): Payer: Medicare Other | Admitting: Internal Medicine

## 2017-12-29 ENCOUNTER — Encounter: Payer: Self-pay | Admitting: Internal Medicine

## 2017-12-29 ENCOUNTER — Other Ambulatory Visit: Payer: Self-pay

## 2017-12-29 VITALS — BP 115/68 | HR 78 | Temp 98.1°F | Ht 73.0 in | Wt 217.2 lb

## 2017-12-29 DIAGNOSIS — I10 Essential (primary) hypertension: Secondary | ICD-10-CM | POA: Diagnosis not present

## 2017-12-29 DIAGNOSIS — N401 Enlarged prostate with lower urinary tract symptoms: Secondary | ICD-10-CM

## 2017-12-29 DIAGNOSIS — M549 Dorsalgia, unspecified: Secondary | ICD-10-CM

## 2017-12-29 DIAGNOSIS — Z79899 Other long term (current) drug therapy: Secondary | ICD-10-CM

## 2017-12-29 DIAGNOSIS — Z23 Encounter for immunization: Secondary | ICD-10-CM | POA: Diagnosis not present

## 2017-12-29 DIAGNOSIS — E663 Overweight: Secondary | ICD-10-CM

## 2017-12-29 DIAGNOSIS — M4800 Spinal stenosis, site unspecified: Secondary | ICD-10-CM

## 2017-12-29 DIAGNOSIS — E785 Hyperlipidemia, unspecified: Secondary | ICD-10-CM | POA: Diagnosis not present

## 2017-12-29 DIAGNOSIS — N400 Enlarged prostate without lower urinary tract symptoms: Secondary | ICD-10-CM

## 2017-12-29 DIAGNOSIS — L409 Psoriasis, unspecified: Secondary | ICD-10-CM

## 2017-12-29 DIAGNOSIS — Z79891 Long term (current) use of opiate analgesic: Secondary | ICD-10-CM

## 2017-12-29 DIAGNOSIS — M48061 Spinal stenosis, lumbar region without neurogenic claudication: Secondary | ICD-10-CM

## 2017-12-29 DIAGNOSIS — H5462 Unqualified visual loss, left eye, normal vision right eye: Secondary | ICD-10-CM

## 2017-12-29 DIAGNOSIS — Z791 Long term (current) use of non-steroidal anti-inflammatories (NSAID): Secondary | ICD-10-CM

## 2017-12-29 DIAGNOSIS — Z0000 Encounter for general adult medical examination without abnormal findings: Secondary | ICD-10-CM

## 2017-12-29 DIAGNOSIS — E7800 Pure hypercholesterolemia, unspecified: Secondary | ICD-10-CM

## 2017-12-29 NOTE — Assessment & Plan Note (Signed)
FIT test at next visit  Never smoker - so he has not had AAA screen or lung cancer screen  Flu shot today

## 2017-12-29 NOTE — Assessment & Plan Note (Signed)
BP was initially elevated, and then improved on recheck.  He has been doing well with his BP medication and denies dizziness, lightheadedness, chest pain or headaches.  He has no trouble getting his medications.   Plan Continue lisinopril 20mg , amlodipine 10mg , hctz 25mg  BMET today for renal function, potassium

## 2017-12-29 NOTE — Assessment & Plan Note (Signed)
Symptoms are about the same.  He is improved on tamsulosin.  He has seen Urology and plans to continue follow up with that group.   Plan Continue tamsulosin.

## 2017-12-29 NOTE — Patient Instructions (Signed)
Jeremy Hill - -  You are doing very well.  Please keep taking your medications as prescribed.   Please come back to see me in 4-6 months, sooner if you need to.   Thank you!

## 2017-12-29 NOTE — Assessment & Plan Note (Signed)
His pain is well controlled today.  He is on a stable dose of hydrocone and doing well.  He is exercising and feels that his pain is actually improving.  His MME is 30mg  which is lowest possible dose for him.  He is also on meloxicam.  I will consider weaning off the chronic NSAIDs at next visit.  NCRS was appropriate.  Last UDS was 2 years ago and appropriate.    Plan Continue current medications with hydrocodone, meloxicam.  Encouraged continued exercise.  Consider U tox at next visit.

## 2017-12-29 NOTE — Progress Notes (Signed)
   Subjective:    Patient ID: Jeremy Hill, male    DOB: 04-01-1945, 71 y.o.   MRN: [id number]  CC: 4 month follow up for back pain, HTN  HPI  Jeremy Hill is a pleasant 72yo man with PMH of HTN, Psoriasis, BPH, spinal stenosis who presents for follow up.   At last visit, we increased his BP medication and he had worsening back pain, he was supposed to come back in 4-6 weeks, but instead came back in 4 months.  He reports that his back pain is better.  He has occasional twinges and bad days, but overall is improved with exercise.  He is requesting renewal of his handicapped parking sticker.  His BP is also improved.  Was initially elevated and then on repeat was 115/68.  He reports he may have taken his medication last night and this morning, so I advised him not to take his medication this evening and then get back on his regular schedule.    He reports otherwise doing well. He has a walking partner at the [facility] and a supportive family.  He is a never smoker.  He reports that he no longer has leg pain, will resolve.    Review of Systems  Constitutional: Negative for activity change, appetite change, chills, fever and unexpected weight change.  Eyes: Positive for visual disturbance (left eye blindness).  Respiratory: Negative for cough, choking and shortness of breath.   Cardiovascular: Negative for chest pain and leg swelling.  Gastrointestinal: Negative for abdominal pain, constipation and diarrhea.  Genitourinary: Negative for difficulty urinating and enuresis.  Musculoskeletal: Positive for arthralgias and back pain. Negative for gait problem.  Skin: Negative for rash and wound.  Neurological: Negative for dizziness, weakness, light-headedness and headaches.  Psychiatric/Behavioral: Negative for decreased concentration and dysphoric mood.       Objective:   Physical Exam  Constitutional: He is oriented to person, place, and time. He appears well-developed and well-nourished. No  distress.  HENT:  Head: Normocephalic and atraumatic.  Cardiovascular: Normal rate and regular rhythm.  No murmur heard. Pulmonary/Chest: Effort normal and breath sounds normal. No respiratory distress. He has no wheezes.  Abdominal: He exhibits no distension.  Musculoskeletal: He exhibits no edema or tenderness.  Neurological: He is alert and oriented to person, place, and time.  Skin:  Changes related to psoriasis  Psychiatric: He has a normal mood and affect. His behavior is normal.  Vitals reviewed.     BMET today.    Assessment & Plan:  Return in 4-6 months, sooner if needed.

## 2017-12-29 NOTE — Assessment & Plan Note (Signed)
Last LDL was 79.  He is on atorvastatin 10mg .   Consider checking LDL at next visit.  Continue atorvastatin.

## 2017-12-30 ENCOUNTER — Encounter: Payer: Self-pay | Admitting: Internal Medicine

## 2017-12-30 LAB — BMP8+ANION GAP
Anion Gap: 17 mmol/L (ref 10.0–18.0)
BUN/Creatinine Ratio: 18 (ref 10–24)
BUN: 18 mg/dL (ref 8–27)
CALCIUM: 9.4 mg/dL (ref 8.6–10.2)
CHLORIDE: 98 mmol/L (ref 96–106)
CO2: 23 mmol/L (ref 20–29)
CREATININE: 0.98 mg/dL (ref 0.76–1.27)
GFR calc Af Amer: 91 mL/min/{1.73_m2} (ref >59)
GFR, EST NON AFRICAN AMERICAN: 78 mL/min/{1.73_m2} (ref >59)
GLUCOSE: 123 mg/dL — AB (ref 65–99)
Potassium: 3.6 mmol/L (ref 3.5–5.2)
Sodium: 138 mmol/L (ref 134–144)

## 2018-02-16 ENCOUNTER — Other Ambulatory Visit: Payer: Self-pay | Admitting: Internal Medicine

## 2018-02-16 DIAGNOSIS — I10 Essential (primary) hypertension: Secondary | ICD-10-CM

## 2018-02-21 ENCOUNTER — Other Ambulatory Visit: Payer: Self-pay

## 2018-02-21 DIAGNOSIS — M4800 Spinal stenosis, site unspecified: Secondary | ICD-10-CM

## 2018-02-21 NOTE — Telephone Encounter (Signed)
HYDROcodone-acetaminophen ([place]) 10-325 MG tablet AT  [facility], [address] AT [address] [phone] (Phone) [phone] (Fax)

## 2018-02-25 MED ORDER — HYDROCODONE-ACETAMINOPHEN 10-325 MG PO TABS: 1.0000 | ORAL_TABLET | Freq: Three times a day (TID) | ORAL | 0 refills | Status: DC | PRN

## 2018-02-25 NOTE — Telephone Encounter (Signed)
Pt calling back for hydrocodone to be filled by today. Please call pt back.

## 2018-03-28 ENCOUNTER — Telehealth: Payer: Self-pay | Admitting: Internal Medicine

## 2018-03-28 DIAGNOSIS — M4800 Spinal stenosis, site unspecified: Secondary | ICD-10-CM

## 2018-03-28 MED ORDER — HYDROCODONE-ACETAMINOPHEN 10-325 MG PO TABS: 1.0000 | ORAL_TABLET | Freq: Three times a day (TID) | ORAL | 0 refills | Status: AC | PRN

## 2018-03-28 NOTE — Telephone Encounter (Signed)
Hydrocodone not on current med list. Last refilled 02/25/18 by Dr Heber Fairfield. Next appt 1/29 with Dr Daryll Drown. UDS - 02/13/16.

## 2018-03-28 NOTE — Telephone Encounter (Signed)
He is on it chronically.  I think the Rx must have expired and dropped off the list.  Refilled.  PDMP reviewed and appropriate.   Thank you  Gilles Chiquito, MD

## 2018-03-28 NOTE — Telephone Encounter (Signed)
Needs refill on his back pain medicine [place], [address]; pt contact [phone].

## 2018-03-30 ENCOUNTER — Ambulatory Visit (INDEPENDENT_AMBULATORY_CARE_PROVIDER_SITE_OTHER): Payer: Medicare Other | Admitting: Internal Medicine

## 2018-03-30 ENCOUNTER — Encounter: Payer: Self-pay | Admitting: Internal Medicine

## 2018-03-30 VITALS — BP 132/84 | HR 68 | Temp 98.4°F | Ht 73.0 in | Wt 222.1 lb

## 2018-03-30 DIAGNOSIS — N400 Enlarged prostate without lower urinary tract symptoms: Secondary | ICD-10-CM

## 2018-03-30 DIAGNOSIS — I10 Essential (primary) hypertension: Secondary | ICD-10-CM

## 2018-03-30 DIAGNOSIS — R399 Unspecified symptoms and signs involving the genitourinary system: Secondary | ICD-10-CM

## 2018-03-30 DIAGNOSIS — E7800 Pure hypercholesterolemia, unspecified: Secondary | ICD-10-CM | POA: Diagnosis not present

## 2018-03-30 DIAGNOSIS — Z634 Disappearance and death of family member: Secondary | ICD-10-CM

## 2018-03-30 DIAGNOSIS — Z0000 Encounter for general adult medical examination without abnormal findings: Secondary | ICD-10-CM

## 2018-03-30 DIAGNOSIS — M48061 Spinal stenosis, lumbar region without neurogenic claudication: Secondary | ICD-10-CM

## 2018-03-30 DIAGNOSIS — E663 Overweight: Secondary | ICD-10-CM

## 2018-03-30 DIAGNOSIS — M4800 Spinal stenosis, site unspecified: Secondary | ICD-10-CM

## 2018-03-30 DIAGNOSIS — Z79891 Long term (current) use of opiate analgesic: Secondary | ICD-10-CM | POA: Diagnosis not present

## 2018-03-30 DIAGNOSIS — E785 Hyperlipidemia, unspecified: Secondary | ICD-10-CM | POA: Diagnosis not present

## 2018-03-30 DIAGNOSIS — Z79899 Other long term (current) drug therapy: Secondary | ICD-10-CM

## 2018-03-30 DIAGNOSIS — F4329 Adjustment disorder with other symptoms: Secondary | ICD-10-CM | POA: Insufficient documentation

## 2018-03-30 DIAGNOSIS — N401 Enlarged prostate with lower urinary tract symptoms: Secondary | ICD-10-CM

## 2018-03-30 DIAGNOSIS — F4321 Adjustment disorder with depressed mood: Secondary | ICD-10-CM | POA: Insufficient documentation

## 2018-03-30 MED ORDER — FINASTERIDE 5 MG PO TABS: 5.0000 mg | ORAL_TABLET | Freq: Every day | ORAL | 2 refills | Status: DC

## 2018-03-30 NOTE — Assessment & Plan Note (Signed)
He is on atorvastatin.  Previously well controlled.  Check lipid panel today.

## 2018-03-30 NOTE — Assessment & Plan Note (Signed)
We discussed the death of this friend.  He has been having lower mood a lot recently due to tragic events.  He is not interested in medication at this time, but would like to try counseling.   IBH referral to [facility] in our office, she was not available for warm hand off at time of his leaving the clinic.

## 2018-03-30 NOTE — Patient Instructions (Signed)
Mr. Vanetten - -  For your urinary symptoms, please start taking Finasteride.  It may take a few months to see an improvement, but this can be a very helpful medication.  More information below  For your low mood and recent death of your friend, I will refer you to our counselor, Dessie Coma.  She can be very helpful with talking through your symptoms.   Thank you for coming in to see me.  Come back to see me in 3 months, sooner if you should need to.    Finasteride (Propecia) tablets What is this medicine? 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 This medicine is not for use in women. This medicine may be used for other purposes; ask your health care provider or pharmacist if you have questions. COMMON BRAND NAME(S): Propecia What should I tell my health care provider before I take this medicine? They need to know if you have any of these conditions: -liver disease -an unusual or allergic reaction to finasteride, other medicines, foods, dyes, or preservatives -pregnant or trying to get pregnant -breast-feeding How should I use this medicine? Take this medicine by mouth with a glass of water. Follow the directions on the prescription label. You can take this medicine with or without food. Take your doses at regular intervals. Do not take your medicine more often than directed. Talk to your pediatrician regarding the use of this medicine in children. Special care may be needed. Overdosage: If you think you have taken too much of this medicine contact a poison control center or emergency room at once. NOTE: This medicine is only for you. Do not share this medicine with others. What if I miss a dose? If you miss a dose, take it as soon as you can. If it is almost time for your next dose, take only that dose. Do not take double or extra doses. What may interact with this medicine? -saw palmetto or other dietary supplements This list may not describe all  possible interactions. Give your health care provider a list of all the medicines, herbs, non-prescription drugs, or dietary supplements you use. Also tell them if you smoke, drink alcohol, or use illegal drugs. Some items may interact with your medicine. What should I watch for while using this medicine? It may take at least three months of daily use of this medicine before you notice an improvement in you hair loss. You must continue to take this medicine to maintain the results. If you stop taking this medicine, the effect will be reversed within 12 months. Do not donate blood while you are taking this medicine. This will prevent giving this medicine to a pregnant male through a blood transfusion. Ask your doctor or health care professional when it is safe to donate blood after you stop taking this medicine. Women who are pregnant or may get pregnant must not handle broken or crushed finasteride tablets. The active ingredient could harm the unborn baby. If a pregnant woman comes into contact with broken or crushed tablets she should check with her doctor or health care professional. Exposure to whole tablets is not expected to cause harm as long as they are not swallowed. This medicine can interfere with PSA laboratory tests for prostate cancer. If you are scheduled to have a lab test for prostate cancer, tell your doctor or health care professional that you are taking this medicine. This medicine may increase your risk of getting some cancers,  like breast cancer. Talk with your doctor. What side effects may I notice from receiving this medicine? Side effects that you should report to your doctor or health care professional as soon as possible: -allergic reactions like skin rash, itching or hives, swelling of the face, lips, or tongue -changes in breast like lumps, pain or fluids leaking from the nipple -pain in the testicles Side effects that usually do not require medical attention (report to your  doctor or health care professional if they continue or are bothersome): -sexual difficulties like decreased sexual desire or ability to get an erection -small amount of semen released during sex This list may not describe all possible side effects. Call your doctor for medical advice about side effects. You may report side effects to FDA at 1-800-FDA-1088. Where should I keep my medicine? Keep out of the reach of children. Store at room temperature between 15 and 30 degrees C (59 and 86 degrees F). Protect from moisture. Keep container tightly closed. Throw away any unused medicine after the expiration date. NOTE: This sheet is a summary. It may not cover all possible information. If you have questions about this medicine, talk to your doctor, pharmacist, or health care provider.  2019 Elsevier/Gold Standard (2016-04-03 09:36:49)

## 2018-03-30 NOTE — Assessment & Plan Note (Signed)
BP today was initially elevated and then improved on recheck.  He is taking his medication as prescribed including amlodipine, hctz, lisinopril.  He notes worsening LUTS symptoms.  Will try finasteride, if no improvement, can consider stopping HCTZ and using another medication without diuretic effect.   Plan Continue triple therapy today.

## 2018-03-30 NOTE — Progress Notes (Signed)
   Subjective:    Patient ID: Jeremy Hill, male    DOB: 1945-10-03, 73 y.o.   MRN: [id number]  CC: 4 month follow up for HTN  HPI  Jeremy Hill is a 73 year old man with PMH of BPH, HTN, HLD, spinal stenosis who presents for routine follow up.   His BP was high initially, however, he notes that this was due to having to rush in here.  He normally has a handicapped sticker for his car, but forgot it.  On recheck his BP was 132/84.    He reports a recent worsening of his LUTS.  He is having to rush to the bathroom, having dribbling and nocturia.  He is on tamsulosin which initially helped, but does not seem to be working as well today.  He has seen urology in 2017, prefers not to be seen again.  PSA checked at that time, but he cannot remember the results.  We discussed adding an alpha blocker like finasteride and he is amenable to this plan.   He further notes his mood being down.  His walking partner, who was 69 years old, recently passed away.  He feels that he has dealt with a lot of death and tragedy lately.  He notes that he has his church family to help him.  I asked if he would be interested in seeing our counselor and he was.  I will refer him to Miquel Dunn here in the clinic.    He is due for FIT testing and Utox.   Review of Systems  Constitutional: Negative for activity change and appetite change.  Respiratory: Negative for cough and shortness of breath.   Cardiovascular: Negative for chest pain.  Gastrointestinal: Negative for abdominal pain.  Genitourinary: Positive for frequency and urgency. Negative for discharge, dysuria and penile pain.  Musculoskeletal: Positive for arthralgias and back pain.  Psychiatric/Behavioral: Positive for decreased concentration and dysphoric mood. Negative for hallucinations and suicidal ideas.        Objective:   Physical Exam Vitals signs and nursing note reviewed.  Constitutional:      General: He is not in acute distress.    Appearance:  Normal appearance. He is not toxic-appearing.  Pulmonary:     Effort: Pulmonary effort is normal. No respiratory distress.  Neurological:     Mental Status: He is alert and oriented to person, place, and time. Mental status is at baseline.  Psychiatric:        Attention and Perception: Attention and perception normal.        Mood and Affect: Mood is depressed. Affect is flat and tearful.        Speech: Speech normal.        Behavior: Behavior normal.        Thought Content: Thought content normal.        Judgment: Judgment normal.    PSA, FIT test, FLP, Utox today    Assessment & Plan:  Return in 2-4 weeks to See Miquel Dunn Return in 3 months to see me.

## 2018-03-30 NOTE — Assessment & Plan Note (Signed)
He is on a chronic stable dose of pain medication.  PDMP reviewed and appropriate.   Plan Utox today Refill at next appropriate time.  Continue current dose of narcotic.

## 2018-03-30 NOTE — Assessment & Plan Note (Signed)
FIT test today.

## 2018-03-30 NOTE — Assessment & Plan Note (Signed)
Reports worsening LUTS.  Went to see Urology in 2017, PSA was done at that time, does not remember value.  Asked to visit yearly.  He would rather not go back and get his care here.  Given worsening LUTS, AA heritage, I will check a PSA today and trend.  He is amenable to treatment if positive.  We discussed benefits, risk of screening.  I will also start finasteride today.  I advised him this medication would take up to 6 months to show total effect.   Plan Start finasteride 5mg  daily PSA today

## 2018-03-31 ENCOUNTER — Telehealth: Payer: Self-pay | Admitting: Internal Medicine

## 2018-03-31 ENCOUNTER — Telehealth: Payer: Self-pay | Admitting: Licensed Clinical Social Worker

## 2018-03-31 DIAGNOSIS — R972 Elevated prostate specific antigen [PSA]: Secondary | ICD-10-CM

## 2018-03-31 LAB — LIPID PANEL
Chol/HDL Ratio: 3.5 ratio (ref 0.0–5.0)
Cholesterol, Total: 184 mg/dL (ref 100–199)
HDL: 52 mg/dL (ref >39)
LDL Calculated: 120 mg/dL — ABNORMAL HIGH (ref 0–99)
Triglycerides: 61 mg/dL (ref 0–149)
VLDL Cholesterol Cal: 12 mg/dL (ref 5–40)

## 2018-03-31 LAB — PSA: Prostate Specific Ag, Serum: 10.8 ng/mL — ABNORMAL HIGH (ref 0.0–4.0)

## 2018-03-31 NOTE — Telephone Encounter (Signed)
Patient was contacted to schedule an appointment due to a recent referral. Patient was scheduled for 04/06/2018.

## 2018-03-31 NOTE — Telephone Encounter (Signed)
Attempted to call Jeremy Hill about his blood work.    If he calls back today, I will talk to him.   Glenda - if he calls back tomorrow, can you tell him that his PSA is higher than it was 2 years ago and Dr. Daryll Drown would like him to go back and see the Urologist to discuss.    If he needs a new referral, can you let me know?   Thanks!  Gilles Chiquito, MD

## 2018-04-01 ENCOUNTER — Telehealth: Payer: Self-pay

## 2018-04-01 NOTE — Telephone Encounter (Signed)
Requesting to speak with a nurse about lab result. Please call pt back.

## 2018-04-01 NOTE — Telephone Encounter (Signed)
Pt called / informed "his PSA is higher than it was 2 years ago and Dr. Daryll Drown would like him to go back and see the Urologist to discuss." per Dr Daryll Drown. Stated he's unsure if he had seen an urologist in the past. I will ask Dr Daryll Drown to place a referral.

## 2018-04-01 NOTE — Addendum Note (Signed)
Addended by: Gilles Chiquito B on: 04/01/2018 11:28 AM   Modules accepted: Orders

## 2018-04-01 NOTE — Telephone Encounter (Addendum)
Pt has questions about elevated PSA. Informed 73yrs ago, it was 6.8, now 10.8. he asked if this means cancer; told him not necessarily and Dr Richardean Canal wants him to f/u with the urologist. Stated he will pray about it.

## 2018-04-01 NOTE — Telephone Encounter (Signed)
Referral Placed.  Thanks Glenda.

## 2018-04-03 LAB — TOXASSURE SELECT,+ANTIDEPR,UR

## 2018-04-05 NOTE — Telephone Encounter (Signed)
Attempted to call patient back to further discuss.  No answer, left voicemail to call the clinic back.   Glenda - - Can you page me if he calls back so I can talk to him?  Thanks!

## 2018-04-06 ENCOUNTER — Telehealth: Payer: Self-pay | Admitting: *Deleted

## 2018-04-06 ENCOUNTER — Encounter: Payer: Self-pay | Admitting: Licensed Clinical Social Worker

## 2018-04-06 ENCOUNTER — Ambulatory Visit: Payer: Medicare Other | Admitting: Licensed Clinical Social Worker

## 2018-04-06 DIAGNOSIS — Z7189 Other specified counseling: Secondary | ICD-10-CM

## 2018-04-06 NOTE — BH Specialist Note (Signed)
Integrated Behavioral Health Initial Visit  MRN: [id number] Name: Jeremy Hill  Number of [place] Clinician visits:: 1/6 Session Start time: 11:06  Session End time: 11:56 Total time: 50 minutes  Type of Service: Integrated Behavioral Health- Individual/Family Interpretor:No.          SUBJECTIVE: Jeremy Hill is a 73 y.o. male  whom attended the session individually.  Patient was referred by Dr. Daryll Drown for grief. Patient reports the following symptoms/concerns: grief, and acute anxiety over health related concerns.  Duration of problem: past few months; Severity of problem: mild  OBJECTIVE: Mood: Netural and Affect: Appropriate Risk of harm to self or others: No plan to harm self or others  LIFE CONTEXT: Family and Social: Patient reported he currently lives alone. Patient has three adult children, and reported he has healthy relationships with all three of them. Patient has been married three different times, and reported he does not trust women anymore after his divorces. Patient interacts with his neighbors, talks to his children regularly, and attends the [facility] for socialization.  School/Work: Patient is retired, but reported he stays very busy.  Self-Care: Patient wants to remain active. Patient works out regularly at [employer]. Patient has several aquariums at his home, and spends time on a weekly basis caring for his fish. Patient reported he goes to church every Wednesday and Sunday, and has many supports at church.  Life Changes: Patient made a close friend at the [facility]. Patient's friend passed away not long ago, and the patient is continuing to grieve her loss.   GOALS ADDRESSED: Patient will: 1. Reduce symptoms of: grief and anxiety.  2. Increase knowledge and/or ability of: to cope with grief  3. Demonstrate ability to: Begin healthy grieving over loss  INTERVENTIONS: Interventions utilized: Motivational Interviewing and Supportive Counseling   Standardized Assessments completed: assessed for SI, HI, and self-harm.  ASSESSMENT: Patient currently experiencing acute anxiety symptoms due to having recent test run related to his health.  Patient reported he is eager to learn the results of his recent test, and has fears related to his overall health. Patient is continuing to grieve the loss of his friend. Patient reported he has trust issues with most women, but was able to form a close relationship with the lady whom recently passed. Patient denies any SI, HI, and self-harm. Patient reported healthy outlets for his grief related symptoms.   Patient may benefit from therapy as needed.   PLAN: 1. Follow up with behavioral health clinician on : prn.   Dessie Coma, LPC, LCAS

## 2018-04-06 NOTE — Telephone Encounter (Signed)
WALK IN Pt walked in wanting to know why dr Daryll Drown had been calling him, reminded him of ph discussion with [place]. And he stated "oh, ok, well im worried about cancer, when will I see the doctor over at [place]". Called [facility] and pt will be seen by dr Karsten Ro 2/13 at 1400. Pt is pleased.

## 2018-04-06 NOTE — Telephone Encounter (Signed)
Thank you Bonnita Nasuti!  Appreciate you arranging that for him.   EBM

## 2018-04-15 NOTE — Telephone Encounter (Signed)
Urology referral ordered per Dr Daryll Drown.

## 2018-04-28 ENCOUNTER — Telehealth: Payer: Self-pay | Admitting: *Deleted

## 2018-04-28 DIAGNOSIS — H348122 Central retinal vein occlusion, left eye, stable: Secondary | ICD-10-CM

## 2018-04-28 DIAGNOSIS — M4800 Spinal stenosis, site unspecified: Secondary | ICD-10-CM

## 2018-04-28 MED ORDER — HYDROCODONE-ACETAMINOPHEN 10-325 MG PO TABS: 1.0000 | ORAL_TABLET | Freq: Three times a day (TID) | ORAL | 0 refills | Status: DC | PRN

## 2018-04-28 NOTE — Telephone Encounter (Signed)
Looks like it just expired off his med list.  I reviewed PDMP and sent in refill.  Thanks!  UDS from last visit appropriate for not daily use.  Thanks!  Gilles Chiquito, MD

## 2018-04-28 NOTE — Telephone Encounter (Signed)
Pt was called and informed of refill. 

## 2018-04-28 NOTE — Telephone Encounter (Signed)
Pt's here at the office for refill on Hydrocodone. Stated due on 1/28. It is not on his current med list.? D/c on 1/27. Last appt w/Dr Daryll Drown was 1/29; also had UDS done. Thanks

## 2018-05-13 ENCOUNTER — Encounter: Payer: Self-pay | Admitting: *Deleted

## 2018-05-25 ENCOUNTER — Telehealth: Payer: Self-pay | Admitting: Internal Medicine

## 2018-05-25 NOTE — Telephone Encounter (Signed)
Attempted to call Jeremy Hill to discuss his recent prostate cancer Dx.  Left a voicemail for him to call the clinic back at his convenience.   Gilles Chiquito, MD

## 2018-05-26 ENCOUNTER — Other Ambulatory Visit: Payer: Self-pay | Admitting: Internal Medicine

## 2018-05-26 DIAGNOSIS — M4800 Spinal stenosis, site unspecified: Secondary | ICD-10-CM

## 2018-05-26 NOTE — Telephone Encounter (Signed)
Refill Request   HYDROcodone-acetaminophen (NORCO) 10-325 MG tablet  [facility] [place], [place]

## 2018-05-27 ENCOUNTER — Other Ambulatory Visit: Payer: Self-pay | Admitting: Internal Medicine

## 2018-05-27 DIAGNOSIS — M4800 Spinal stenosis, site unspecified: Secondary | ICD-10-CM

## 2018-05-27 MED ORDER — HYDROCODONE-ACETAMINOPHEN 10-325 MG PO TABS: 1.0000 | ORAL_TABLET | Freq: Three times a day (TID) | ORAL | 0 refills | Status: DC | PRN

## 2018-05-27 NOTE — Telephone Encounter (Signed)
Need refill on HYDROcodone-acetaminophen (NORCO) 10-325 MG tablet [id number] - [place], [place]  ;pt contact [phone]  Pls contact pt [phone]

## 2018-05-27 NOTE — Telephone Encounter (Signed)
do

## 2018-06-06 ENCOUNTER — Ambulatory Visit: Payer: Medicare Other

## 2018-06-06 ENCOUNTER — Encounter: Payer: Self-pay | Admitting: Radiation Oncology

## 2018-06-06 ENCOUNTER — Ambulatory Visit: Payer: Medicare Other | Admitting: Radiation Oncology

## 2018-06-06 NOTE — Progress Notes (Addendum)
GU Location of Tumor / Histology: prostatic adenocarcinoma  If Prostate Cancer, Gleason Score is (3 + 4) and PSA is (10.8). Prostate volume: 57.97   Jeremy Hill was noted to have an elevated PSA in March 2017. Unfortunately, he didn't show back up to have his PSA re-evaluated until the fall of 2019.  Biopsies of prostate (if applicable) revealed:    Past/Anticipated interventions by urology, if any: prostate biopsy, referral for consideration of radioactive seeds.  Past/Anticipated interventions by medical oncology, if any: no  Weight changes, if any: no  Bowel/Bladder complaints, if any: Frequency, urgency, and nocturia x 3. Denies dysuria, hematuria, urinary leakage or incontinence.   Nausea/Vomiting, if any: no  Pain issues, if any:  Taking hydrocodone-acetaminophen 10/325 for upper back pain. Related to injury in 2003.   SAFETY ISSUES:  Prior radiation? no  Pacemaker/ICD? no  Possible current pregnancy? no, male patient  Is the patient on methotrexate? no  Current Complaints / other details:  73 year old male. Recently divorced. 1 daughter and 2 sons. PCP: Gilles Chiquito. NKDA. Semi retired; works Counsellor.

## 2018-06-07 ENCOUNTER — Encounter: Payer: Self-pay | Admitting: Radiation Oncology

## 2018-06-07 ENCOUNTER — Ambulatory Visit
Admission: RE | Admit: 2018-06-07 | Discharge: 2018-06-07 | Disposition: A | Discharge status: Home / Self Care | Payer: Medicare Other | Source: Ambulatory Visit | Attending: Radiation Oncology | Admitting: Radiation Oncology

## 2018-06-07 ENCOUNTER — Other Ambulatory Visit: Payer: Self-pay

## 2018-06-07 VITALS — BP 130/94 | HR 66 | Temp 97.7°F | Resp 20 | Ht 73.0 in | Wt 221.0 lb

## 2018-06-07 DIAGNOSIS — Z803 Family history of malignant neoplasm of breast: Secondary | ICD-10-CM | POA: Diagnosis not present

## 2018-06-07 DIAGNOSIS — F329 Major depressive disorder, single episode, unspecified: Secondary | ICD-10-CM | POA: Insufficient documentation

## 2018-06-07 DIAGNOSIS — M4624 Osteomyelitis of vertebra, thoracic region: Secondary | ICD-10-CM | POA: Diagnosis not present

## 2018-06-07 DIAGNOSIS — C61 Malignant neoplasm of prostate: Secondary | ICD-10-CM

## 2018-06-07 DIAGNOSIS — D709 Neutropenia, unspecified: Secondary | ICD-10-CM | POA: Insufficient documentation

## 2018-06-07 DIAGNOSIS — I10 Essential (primary) hypertension: Secondary | ICD-10-CM | POA: Insufficient documentation

## 2018-06-07 DIAGNOSIS — R9721 Rising PSA following treatment for malignant neoplasm of prostate: Secondary | ICD-10-CM | POA: Diagnosis not present

## 2018-06-07 DIAGNOSIS — Z79899 Other long term (current) drug therapy: Secondary | ICD-10-CM | POA: Insufficient documentation

## 2018-06-07 HISTORY — DX: Malignant neoplasm of prostate: C61

## 2018-06-07 NOTE — Progress Notes (Signed)
See progress note under physician encounter. 

## 2018-06-07 NOTE — Progress Notes (Signed)
Radiation Oncology         [phone] ________________________________  Initial Outpatient Consultation - Conducted via WebEx with patient on site, due to current COVID-19 concerns for limiting patient exposure.  Name: Jeremy Hill MRN: [id number]  Date: 06/07/2018  DOB: 05-Aug-1945  EX:[id number], Peri Jefferson, MD  Kathie Rhodes, MD   REFERRING PHYSICIAN: Kathie Rhodes, MD  DIAGNOSIS: 73 y.o. gentleman with Stage T1c adenocarcinoma of the prostate with Gleason score of 3+4, and PSA of 10.8.    ICD-10-CM   1. Malignant neoplasm of prostate ([place]) C61     HISTORY OF PRESENT ILLNESS: Jeremy Hill is a 73 y.o. male with a diagnosis of prostate cancer. He was initially noted to have an elevated PSA of 6.8 in March 2017 but unfortunately failed to return for follow up until fall 2019. He was noted to have a further elevated PSA of 10.8 in November 2019 by his primary care physician, Jeremy Hill.  He has had significant LUTS despite Flomax and was started on finasteride in January 2020. Accordingly, he was referred back to Dr. Karsten Ro for further urologic evaluation on 04/14/2018, and a digital rectal examination was performed at that time revealing no prostate nodules.  The patient proceeded to transrectal ultrasound with 12 biopsies of the prostate on 05/05/2018.  The prostate volume measured 57.97 cc.  Out of 12 core biopsies, 7 were positive.  The maximum Gleason score was 3+4, and this was seen in the left base lateral, left mid lateral, left apex lateral, left mid, and right apex. Additionally, Gleason 3+3 disease was seen in the right mid and right apex lateral.   Biopsies of prostate revealed:    The patient reviewed the biopsy results with his urologist and he has kindly been referred today for discussion of potential radiation treatment options.  PREVIOUS RADIATION THERAPY: No  PAST MEDICAL HISTORY:  Past Medical History:  Diagnosis Date  . Anemia 2008   borderline low Hg/Hct =  12.6/39.6 (01/10/2007), no anemia panel available and patient had refused colonoscopy, last colonoscopy done was in 2005 and the results were normal, done by Dr. Collene Mares  . Depression   . Hypertension   . Neutropenia 2008   noted on cbc (01/10/2007) WBC = 3.9, also CBC done 08/2006 showed WBC = 3.0, unclear etiology; CXR done 08/2006 showed questionable right lung nodule and the follow up CT was recommended, there is no data in the [place] that it was done  . Osteomyelitis ([place]) 12/2001   per MRI of the spine - GIVEN THE ABNORMALITY AT THE T8-9 LEVEL, INFECTION AT THESE LATTER REGIONS CANNOT BE COMPLETELY EXCLUDED AND  WILL NEED TO BE FOLLOWED CLOSELY. SCATTERED DEGENERATIVE CHANGES IN THE LOWER  THORACIC/LUMBAR SPINE  AT THE L4-5 SIGNIFICANT NEURAL FORAMINAL NARROWING (R>L).  DECREASED SIGNAL INTENSITY OF BONE MARROW. UNDERLYING ANEMIA/INFILTRATIVE PROCESS/ LYMPHOMA.   . Osteomyelitis, chronic ([place]) 2004   persistant osteomyelitis per MRI 04/2002 and also progressive at the same level as in 2003 T8-9 level  . Prostate cancer ([place])       PAST SURGICAL HISTORY: Past Surgical History:  Procedure Laterality Date  . HERNIA REPAIR    . LYMPH NODE BIOPSY  01/2002   right inguinal node biopsy (done secondary to finding of neutropenia and lymphadenopathy) -  REACTIVE LYMPHOID HYPERPLASIA WITH SINUS HISTIOCYTOSIS AND PLASMACYTOSIS, no evidence of malignancy  . PROSTATE BIOPSY    . RHINOPLASTY      FAMILY HISTORY:  Family History  Problem Relation  Age of Onset  . Heart disease Mother   . Heart disease Father   . Diabetes Sister   . Breast cancer Sister   . Suicidality Brother   . Prostate cancer Neg Hx   . Colon cancer Neg Hx   . Pancreatic cancer Neg Hx     SOCIAL HISTORY:  Social History   Socioeconomic History  . Marital status: Divorced    Spouse name: Not on file  . Number of children: 3  . Years of education: Not on file  . Highest education level: Not on file  Occupational  History    Comment: retired  Scientific laboratory technician  . Financial resource strain: Not on file  . Food insecurity:    Worry: Not on file    Inability: Not on file  . Transportation needs:    Medical: Not on file    Non-medical: Not on file  Tobacco Use  . Smoking status: Never Smoker  . Smokeless tobacco: Never Used  Substance and Sexual Activity  . Alcohol use: No    Alcohol/week: 0.0 standard drinks  . Drug use: No  . Sexual activity: Not Currently  Lifestyle  . Physical activity:    Days per week: Not on file    Minutes per session: Not on file  . Stress: Not on file  Relationships  . Social connections:    Talks on phone: Not on file    Gets together: Not on file    Attends religious service: Not on file    Active member of club or organization: Not on file    Attends meetings of clubs or organizations: Not on file    Relationship status: Not on file  . Intimate partner violence:    Fear of current or ex partner: Not on file    Emotionally abused: Not on file    Physically abused: Not on file    Forced sexual activity: Not on file  Other Topics Concern  . Not on file  Social History Narrative   Recently divorced.     ALLERGIES: Patient has no known allergies.  MEDICATIONS:  Current Outpatient Medications  Medication Sig Dispense Refill  . amLODipine (NORVASC) 10 MG tablet TAKE 1 TABLET(10 MG) BY MOUTH DAILY 90 tablet 3  . hydrochlorothiazide (HYDRODIURIL) 25 MG tablet TAKE 1 TABLET(25 MG) BY MOUTH DAILY 90 tablet 3  . HYDROcodone-acetaminophen (NORCO) 10-325 MG tablet Take 1 tablet by mouth every 8 (eight) hours as needed for up to 30 days for severe pain. 90 tablet 0  . lisinopril (PRINIVIL,ZESTRIL) 20 MG tablet TAKE 1 TABLET BY MOUTH DAILY 90 tablet 1   No current facility-administered medications for this encounter.     REVIEW OF SYSTEMS:  On review of systems, the patient reports that he is doing well overall. He denies any chest pain, shortness of breath, cough,  fevers, chills, night sweats, or unintended weight changes. He denies any bowel disturbances, and denies abdominal pain, nausea or vomiting. He reports upper back pain related to an injury 17 years ago and takes hydrocodone-acetaminophen 10/325, but denies any new musculoskeletal or joint aches or pains. He was recently provided samples of Myrbetriq by his urologist and his IPSS today was 4, indicating mild urinary symptoms with frequency, urgency, and nocturia x3 which are improved on Myrbetriq and finasteride. He denies dysuria, hematuria, leakage or incontinence. His SHIM was 9, indicating he does have moderate erectile dysfunction. A complete review of systems is obtained and is otherwise negative.  PHYSICAL EXAM:  Wt Readings from Last 3 Encounters:  06/07/18 221 lb (100.2 kg)  03/30/18 222 lb 1.6 oz (100.7 kg)  12/29/17 217 lb 3.2 oz (98.5 kg)   Temp Readings from Last 3 Encounters:  06/07/18 97.7 F (36.5 C) (Oral)  03/30/18 98.4 F (36.9 C) (Oral)  12/29/17 98.1 F (36.7 C) (Oral)   BP Readings from Last 3 Encounters:  06/07/18 (!) 130/94  03/30/18 132/84  12/29/17 115/68   Pulse Readings from Last 3 Encounters:  06/07/18 66  03/30/18 68  12/29/17 78   Pain Assessment Pain Score: 0-No pain/10  In general this is a well appearing African American male in no acute distress. He is alert and oriented x4 and appropriate throughout the examination. Cardiopulmonary assessment is negative for acute distress and he exhibits normal effort.  *PE is limited due to telemedicine consult format*   LABORATORY DATA:  Lab Results  Component Value Date   WBC 4.0 07/14/2011   HGB 13.8 07/14/2011   HCT 42.9 07/14/2011   MCV 89.2 07/14/2011   PLT 238 07/14/2011   Lab Results  Component Value Date   NA 138 12/29/2017   K 3.6 12/29/2017   CL 98 12/29/2017   CO2 23 12/29/2017   Lab Results  Component Value Date   ALT 12 06/09/2016   AST 20 06/09/2016   ALKPHOS 62 06/09/2016    BILITOT 0.6 06/09/2016     RADIOGRAPHY: No results found.    IMPRESSION/PLAN: This visit was conducted via WebEx to spare the patient unnecessary potential exposure in the healthcare setting during the current COVID-19 pandemic. 1. 73 y.o. gentleman with Stage T1c adenocarcinoma of the prostate with Gleason Score of 3+4, and PSA of 10.8. We discussed the patient's workup and outlined the nature of prostate cancer in this setting. The patient's T stage, Gleason's score, and PSA put him into the intermediate risk group. Accordingly, he is eligible for a variety of potential treatment options including brachytherapy or 5.5 weeks of external radiation. We discussed the available radiation techniques, and focused on the details and logistics and delivery. We discussed and outlined the risks, benefits, short and long-term effects associated with radiotherapy and compared and contrasted these with prostatectomy. We discussed the role of SpaceOAR in reducing the rectal toxicity associated with radiotherapy. We also detailed the potential role for short-term androgen deprivation therapy in the event that his treatment is delayed due to current concerns and restrictions related to the scheduling of surgical procedures during the current COVID-19 pandemic. Otherwise, he is felt to be a suitable candidate for 5.5 weeks of external beam radiotherapy alone, which could likely be initiated in the next 2-3 weeks.   At the end of the conversation the patient remains undecided regarding his final treatment preference but appears to be leaning towards brachytherapy. We will follow up with him on his final decision in the near future and move forward with treatment planning accordingly. The patient understands that the timing of scheduling radiation procedures such as brachytherpay will depend on progress made in controlling the current COVID-19 pandemic which may cause delays in planning and treatment start as we move  forward. If he chooses brachytherapy and there will be delay of more than 2 months in scheduling, we would recommend using ST-ADT in the interim to protect him from potential disease progression and also for secondary benefits of downsizing the prostate given that he is at the upper end of the size criteria for seed implants at 58  cc. If he elects to proceed with prostate IMRT, he will need fiducial markers and SpaceOAR gel placement prior to CT Simulation in preparation for daily radiation.  He appears to have a good understanding of his disease and our recommendations and is in agreement with the stated plan.  He was encouraged to call with any additional questions or issues in the interim.  This encounter was provided by telemedicine platform via Webex with the patient physically present on site in the [place] at [facility].  The patient has given verbal consent for this type of encounter. The time spent during this encounter was 55 minutes. The attendants for this meeting include Tyler Pita MD, [address] PA-C, scribe Tyler Pita, and patient Jeremy Hill.  During the encounter, Tyler Pita MD, Ashlyn Bruning PA-C, and scribe Tyler Pita were located at [facility] Radiation Oncology Department.  Patient Jeremy Hill was located at [facility] Radiation Oncology Department.   Nicholos Johns, PA-C    Tyler Pita, MD  [facility] Direct Dial: [phone]  Fax: [phone] [email or web address]  Skype  LinkedIn  This document serves as a record of services personally performed by Tyler Pita, MD and Freeman Caldron, PA-C. It was created on their behalf by Rae Lips, a trained medical scribe. The creation of this record is based on the scribe's personal observations and the providers' statements to them. This document has been checked and approved by the attending  providers.

## 2018-06-08 DIAGNOSIS — C61 Malignant neoplasm of prostate: Secondary | ICD-10-CM | POA: Insufficient documentation

## 2018-06-15 ENCOUNTER — Other Ambulatory Visit: Payer: Self-pay

## 2018-06-15 ENCOUNTER — Encounter: Payer: Medicare Other | Admitting: Internal Medicine

## 2018-06-16 ENCOUNTER — Telehealth: Payer: Self-pay | Admitting: Medical Oncology

## 2018-06-16 NOTE — Telephone Encounter (Signed)
Left message with Jeremy Hill to introduce myself as the prostate nurse navigator and my role. I was unable to meet him 4/7 due to consult being conducted via WebEx due to current COVID-19 concerns for limiting patient exposure.  I asked him to return my call to discuss his treatment decision for his prostate cancer. I did inform him I am working remotely and asked him to leave me a voicemail and I will return his call.

## 2018-06-17 ENCOUNTER — Telehealth: Payer: Self-pay | Admitting: Medical Oncology

## 2018-06-17 ENCOUNTER — Ambulatory Visit: Payer: Medicare Other | Admitting: Internal Medicine

## 2018-06-17 ENCOUNTER — Other Ambulatory Visit: Payer: Self-pay

## 2018-06-17 NOTE — Telephone Encounter (Signed)
Left message asking for a return call with his treatment decision. I informed him I am working remotely and asked him to leave this information on my office phone. I told him if he has not made a decision just let me know.

## 2018-06-20 ENCOUNTER — Telehealth: Payer: Self-pay | Admitting: Radiation Oncology

## 2018-06-20 ENCOUNTER — Telehealth: Payer: Self-pay | Admitting: Medical Oncology

## 2018-06-20 ENCOUNTER — Telehealth: Payer: Self-pay | Admitting: *Deleted

## 2018-06-20 ENCOUNTER — Telehealth: Payer: Self-pay

## 2018-06-20 NOTE — Telephone Encounter (Signed)
Received voicemail message from patient detailing his desire to move forward with seeds. Phoned patient back for clarification and to determine if ADT has been received yet. No answer. Left message with my direct line requesting a return call.

## 2018-06-20 NOTE — Telephone Encounter (Signed)
Called patient to inform of ST-ADT appt. for Thursday April 23 - arrival time- 10 am @ Dr. Simone Curia Office, lvm for a return call

## 2018-06-20 NOTE — Telephone Encounter (Signed)
Requesting to speak with Dr. Daryll Drown. Please call pt back.

## 2018-06-20 NOTE — Telephone Encounter (Signed)
Jeremy Hill left a voicemail stating he has decided on brachytherapy to treat his prostate cancer. He has not received ADT and will forward this message on to Dr. Forrest Moron.

## 2018-06-20 NOTE — Telephone Encounter (Signed)
Returned call to patient. No answer. Left message on VM requesting return call. L. [place], RN, BSN     

## 2018-06-21 NOTE — Telephone Encounter (Signed)
Called pt - no answer; leftr message to give [place] a call back .

## 2018-06-23 ENCOUNTER — Other Ambulatory Visit: Payer: Self-pay

## 2018-06-23 DIAGNOSIS — M4800 Spinal stenosis, site unspecified: Secondary | ICD-10-CM

## 2018-06-23 MED ORDER — HYDROCODONE-ACETAMINOPHEN 10-325 MG PO TABS: 1.0000 | ORAL_TABLET | Freq: Three times a day (TID) | ORAL | 0 refills | Status: DC | PRN

## 2018-06-23 NOTE — Telephone Encounter (Signed)
Last rx written  05/27/18. Last OV  03/30/18. Next OV  No f/u has been scheduled. UDS  03/30/18.

## 2018-06-23 NOTE — Telephone Encounter (Signed)
HYDROcodone-acetaminophen (NORCO) 10-325 MG tablet   Refill request @  [facility] [place], [place] AT [address] [phone] (Phone) [phone] (Fax)

## 2018-06-24 ENCOUNTER — Encounter: Payer: Self-pay | Admitting: Medical Oncology

## 2018-07-26 ENCOUNTER — Telehealth: Payer: Self-pay

## 2018-07-26 ENCOUNTER — Other Ambulatory Visit: Payer: Self-pay | Admitting: Urology

## 2018-07-26 DIAGNOSIS — M4800 Spinal stenosis, site unspecified: Secondary | ICD-10-CM

## 2018-07-26 MED ORDER — HYDROCODONE-ACETAMINOPHEN 10-325 MG PO TABS: 1.0000 | ORAL_TABLET | Freq: Three times a day (TID) | ORAL | 0 refills | Status: DC | PRN

## 2018-07-26 NOTE — Telephone Encounter (Signed)
Requesting pain med to be filled @  [place], [address] [phone] (Phone) [phone] (Fax)   Please call pt back.

## 2018-07-26 NOTE — Telephone Encounter (Signed)
Pt called / informed of refill; reminded pt to call a few days before his next refill is needed.

## 2018-07-26 NOTE — Telephone Encounter (Signed)
It does keep expiring off of his med list, so I think I fixed that.   Refill sent in, he should be able to pick it up today.    Thank you!  Gilles Chiquito, MD

## 2018-07-26 NOTE — Telephone Encounter (Signed)
Pain med is not on current med list.

## 2018-07-26 NOTE — Telephone Encounter (Signed)
Pt was here at the office asking about pain med refill; informed refill request was sent to his doctor this morning. Stated he hurts all over; recent dx with prostate ca. Told him I will call him when it is refilled.

## 2018-07-27 ENCOUNTER — Other Ambulatory Visit: Payer: Self-pay | Admitting: Urology

## 2018-07-27 DIAGNOSIS — C61 Malignant neoplasm of prostate: Secondary | ICD-10-CM

## 2018-07-29 ENCOUNTER — Telehealth: Payer: Self-pay | Admitting: *Deleted

## 2018-07-29 NOTE — Telephone Encounter (Signed)
CALLED PATIENT TO INFORM OF PRE-SEED PLANNING FOR 08-11-18 AND HIS IMPLANT ON 09-19-18, SPOKE WITH PATIENT AND HE IS AWARE OF THESE PROCEDURES.

## 2018-08-03 ENCOUNTER — Other Ambulatory Visit: Payer: Self-pay

## 2018-08-03 ENCOUNTER — Encounter: Payer: Self-pay | Admitting: Internal Medicine

## 2018-08-03 ENCOUNTER — Ambulatory Visit (INDEPENDENT_AMBULATORY_CARE_PROVIDER_SITE_OTHER): Payer: Medicare Other | Admitting: Internal Medicine

## 2018-08-03 VITALS — BP 175/97 | HR 89 | Temp 98.0°F | Wt 223.1 lb

## 2018-08-03 DIAGNOSIS — M79671 Pain in right foot: Secondary | ICD-10-CM

## 2018-08-03 DIAGNOSIS — M722 Plantar fascial fibromatosis: Secondary | ICD-10-CM | POA: Insufficient documentation

## 2018-08-03 MED ORDER — NAPROXEN 500 MG PO TBEC: 500.0000 mg | DELAYED_RELEASE_TABLET | Freq: Two times a day (BID) | ORAL | 0 refills | Status: DC

## 2018-08-03 MED ORDER — DICLOFENAC SODIUM 1 % TD GEL: 2.0000 g | Freq: Four times a day (QID) | TRANSDERMAL | 0 refills | Status: DC

## 2018-08-03 NOTE — Patient Instructions (Addendum)
Thank you for allowing [place] to care for you  For you heel pain - This pain is consistent with plantar fascitis that may be due to a bone spur - Take Daily Ibuprofen for the next 1-2 weeks - You can use topical Voltaren gel for relief as well - We have a referral to podiatry for orthotics and follow up - We have provided example stretches/treatments  Please follow up with PCP and Urology as scheduled   Plantar Fasciitis  Plantar fasciitis is a painful foot condition that affects the heel. It occurs when the band of tissue that connects the toes to the heel bone (plantar fascia) becomes irritated. This can happen as the result of exercising too much or doing other repetitive activities (overuse injury). The pain from plantar fasciitis can range from mild irritation to severe pain that makes it difficult to walk or move. The pain is usually worse in the morning after sleeping, or after sitting or lying down for a while. Pain may also be worse after long periods of walking or standing. What are the causes? This condition may be caused by:  Standing for long periods of time.  Wearing shoes that do not have good arch support.  Doing activities that put stress on joints (high-impact activities), including running, aerobics, and ballet.  Being overweight.  An abnormal way of walking (gait).  Tight muscles in the back of your lower leg (calf).  High arches in your feet.  Starting a new athletic activity. What are the signs or symptoms? The main symptom of this condition is heel pain. Pain may:  Be worse with first steps after a time of rest, especially in the morning after sleeping or after you have been sitting or lying down for a while.  Be worse after long periods of standing still.  Decrease after 30-45 minutes of activity, such as gentle walking. How is this diagnosed? This condition may be diagnosed based on your medical history and your symptoms. Your health care provider may  ask questions about your activity level. Your health care provider will do a physical exam to check for:  A tender area on the bottom of your foot.  A high arch in your foot.  Pain when you move your foot.  Difficulty moving your foot. You may have imaging tests to confirm the diagnosis, such as:  X-rays.  Ultrasound.  MRI. How is this treated? Treatment for plantar fasciitis depends on how severe your condition is. Treatment may include:  Rest, ice, applying pressure (compression), and raising the affected foot (elevation). This may be called RICE therapy. Your health care provider may recommend RICE therapy along with over-the-counter pain medicines to manage your pain.  Exercises to stretch your calves and your plantar fascia.  A splint that holds your foot in a stretched, upward position while you sleep (night splint).  Physical therapy to relieve symptoms and prevent problems in the future.  Injections of steroid medicine (cortisone) to relieve pain and inflammation.  Stimulating your plantar fascia with electrical impulses (extracorporeal shock wave therapy). This is usually the last treatment option before surgery.  Surgery, if other treatments have not worked after 12 months. Follow these instructions at home:  Managing pain, stiffness, and swelling  If directed, put ice on the painful area: ? Put ice in a plastic bag, or use a frozen bottle of water. ? Place a towel between your skin and the bag or bottle. ? Roll the bottom of your foot over the  bag or bottle. ? Do this for 20 minutes, 2-3 times a day.  Wear athletic shoes that have air-sole or gel-sole cushions, or try wearing soft shoe inserts that are designed for plantar fasciitis.  Raise (elevate) your foot above the level of your heart while you are sitting or lying down. Activity  Avoid activities that cause pain. Ask your health care provider what activities are safe for you.  Do physical therapy  exercises and stretches as told by your health care provider.  Try activities and forms of exercise that are easier on your joints (low-impact). Examples include swimming, water aerobics, and biking. General instructions  Take over-the-counter and prescription medicines only as told by your health care provider.  Wear a night splint while sleeping, if told by your health care provider. Loosen the splint if your toes tingle, become numb, or turn cold and blue.  Maintain a healthy weight, or work with your health care provider to lose weight as needed.  Keep all follow-up visits as told by your health care provider. This is important. Contact a health care provider if you:  Have symptoms that do not go away after caring for yourself at home.  Have pain that gets worse.  Have pain that affects your ability to move or do your daily activities. Summary  Plantar fasciitis is a painful foot condition that affects the heel. It occurs when the band of tissue that connects the toes to the heel bone (plantar fascia) becomes irritated.  The main symptom of this condition is heel pain that may be worse after exercising too much or standing still for a long time.  Treatment varies, but it usually starts with rest, ice, compression, and elevation (RICE therapy) and over-the-counter medicines to manage pain. This information is not intended to replace advice given to you by your health care provider. Make sure you discuss any questions you have with your health care provider. Document Released: 11/11/2000 Document Revised: 12/14/2016 Document Reviewed: 12/14/2016 Elsevier Interactive Patient Education  2019 [employer].

## 2018-08-03 NOTE — Progress Notes (Signed)
   CC: Right foot pain   HPI:   [name] is a 73 y.o. M with PMHx listed below presenting for Right foot pain. Please see the A&P for the status of the patient's chronic medical problems.  Past Medical History:  Diagnosis Date  . Anemia 2008   borderline low Hg/Hct = 12.6/39.6 (01/10/2007), no anemia panel available and patient had refused colonoscopy, last colonoscopy done was in 2005 and the results were normal, done by Dr. Collene Mares  . Depression   . Hypertension   . Neutropenia 2008   noted on cbc (01/10/2007) WBC = 3.9, also CBC done 08/2006 showed WBC = 3.0, unclear etiology; CXR done 08/2006 showed questionable right lung nodule and the follow up CT was recommended, there is no data in the [place] that it was done  . Osteomyelitis ([place]) 12/2001   per MRI of the spine - GIVEN THE ABNORMALITY AT THE T8-9 LEVEL, INFECTION AT THESE LATTER REGIONS CANNOT BE COMPLETELY EXCLUDED AND  WILL NEED TO BE FOLLOWED CLOSELY. SCATTERED DEGENERATIVE CHANGES IN THE LOWER  THORACIC/LUMBAR SPINE  AT THE L4-5 SIGNIFICANT NEURAL FORAMINAL NARROWING (R>L).  DECREASED SIGNAL INTENSITY OF BONE MARROW. UNDERLYING ANEMIA/INFILTRATIVE PROCESS/ LYMPHOMA.   . Osteomyelitis, chronic ([place]) 2004   persistant osteomyelitis per MRI 04/2002 and also progressive at the same level as in 2003 T8-9 level  . Prostate cancer [facility])    Review of Systems:  Performed and all others negative.  Physical Exam:  Vitals:   08/03/18 1040  BP: (!) 175/97  Pulse: 89  Temp: 98 F (36.7 C)  TempSrc: Oral  SpO2: 99%  Weight: 223 lb 1.6 oz (101.2 kg)   Physical Exam Constitutional:      General: He is not in acute distress.    Appearance: Normal appearance.  Cardiovascular:     Rate and Rhythm: Normal rate and regular rhythm.     Pulses: Normal pulses.     Heart sounds: Normal heart sounds.  Pulmonary:     Effort: Pulmonary effort is normal. No respiratory distress.     Breath sounds: Normal breath sounds.  Abdominal:      General: Bowel sounds are normal. There is no distension.     Palpations: Abdomen is soft.     Tenderness: There is no abdominal tenderness.  Musculoskeletal:        General: No swelling or deformity.       Feet:  Feet:     Comments: Tender to palpation at plantar surface of heel Skin:    General: Skin is warm and dry.  Neurological:     General: No focal deficit present.     Mental Status: Mental status is at baseline.     Assessment & Plan:   See Encounters Tab for problem based charting.  Patient discussed with Dr. Evette Doffing

## 2018-08-03 NOTE — Assessment & Plan Note (Addendum)
Patient presents with 4 days of right heel pain. The pain has been faily constant and is described as a sharp, non-radiatin pain that is worse with walking or palpation. He has had mild temporary relief with icing and wrapping with an ACE bandage. Tenderness on palpation is localized to where patient described and does not radiate.  Presentation is consistent with plantar fascitis (which may or may not be associated with a bone spur.) - Avoid Painful Activities - Naproxen 500mg  BID x 10d - Voltaren gel PRN - Podiatry referral for orthotics and follow up - Stretching exercises

## 2018-08-04 NOTE — Progress Notes (Signed)
Internal Medicine Clinic Attending  Case discussed with Dr. Melvin  at the time of the visit.  We reviewed the resident's history and exam and pertinent patient test results.  I agree with the assessment, diagnosis, and plan of care documented in the resident's note.  

## 2018-08-10 ENCOUNTER — Telehealth: Payer: Self-pay | Admitting: *Deleted

## 2018-08-10 NOTE — Telephone Encounter (Signed)
CALLED PATIENT TO REMIND OF PRE-SEED APPTS. AND CHEST X-RAY AND EKG FOR 08-11-18, LVM FOR A RETURN CALL

## 2018-08-11 ENCOUNTER — Encounter (HOSPITAL_COMMUNITY): Payer: Self-pay | Admitting: Physician Assistant

## 2018-08-11 ENCOUNTER — Ambulatory Visit
Admission: RE | Admit: 2018-08-11 | Discharge: 2018-08-11 | Disposition: A | Discharge status: Home / Self Care | Payer: Medicare Other | Source: Ambulatory Visit | Attending: Radiation Oncology | Admitting: Radiation Oncology

## 2018-08-11 ENCOUNTER — Ambulatory Visit (HOSPITAL_COMMUNITY)
Admission: RE | Admit: 2018-08-11 | Discharge: 2018-08-11 | Disposition: A | Discharge status: Home / Self Care | Payer: Medicare Other | Source: Ambulatory Visit | Attending: Urology | Admitting: Urology

## 2018-08-11 ENCOUNTER — Other Ambulatory Visit: Payer: Self-pay

## 2018-08-11 ENCOUNTER — Encounter (HOSPITAL_COMMUNITY)
Admission: RE | Admit: 2018-08-11 | Discharge: 2018-08-11 | Disposition: A | Discharge status: Home / Self Care | Payer: Medicare Other | Source: Ambulatory Visit | Attending: Urology | Admitting: Urology

## 2018-08-11 ENCOUNTER — Ambulatory Visit
Admission: RE | Admit: 2018-08-11 | Discharge: 2018-08-11 | Disposition: A | Discharge status: Home / Self Care | Payer: Medicare Other | Source: Ambulatory Visit | Attending: Urology | Admitting: Urology

## 2018-08-11 DIAGNOSIS — M4624 Osteomyelitis of vertebra, thoracic region: Secondary | ICD-10-CM | POA: Insufficient documentation

## 2018-08-11 DIAGNOSIS — F329 Major depressive disorder, single episode, unspecified: Secondary | ICD-10-CM | POA: Insufficient documentation

## 2018-08-11 DIAGNOSIS — C61 Malignant neoplasm of prostate: Secondary | ICD-10-CM | POA: Insufficient documentation

## 2018-08-11 DIAGNOSIS — I10 Essential (primary) hypertension: Secondary | ICD-10-CM | POA: Insufficient documentation

## 2018-08-11 DIAGNOSIS — Z01818 Encounter for other preprocedural examination: Secondary | ICD-10-CM | POA: Diagnosis not present

## 2018-08-11 DIAGNOSIS — D709 Neutropenia, unspecified: Secondary | ICD-10-CM | POA: Insufficient documentation

## 2018-08-11 DIAGNOSIS — Z79899 Other long term (current) drug therapy: Secondary | ICD-10-CM | POA: Insufficient documentation

## 2018-08-11 DIAGNOSIS — R9721 Rising PSA following treatment for malignant neoplasm of prostate: Secondary | ICD-10-CM | POA: Insufficient documentation

## 2018-08-11 DIAGNOSIS — Z803 Family history of malignant neoplasm of breast: Secondary | ICD-10-CM | POA: Insufficient documentation

## 2018-08-11 NOTE — Progress Notes (Signed)
  Radiation Oncology         [phone] ________________________________  Name: Jeremy Hill MRN: [id number]  Date: 08/11/2018  DOB: 09/15/1945  SIMULATION AND TREATMENT PLANNING NOTE PUBIC ARCH STUDY  OB:SJGGEZ, Peri Jefferson, MD  Sid Falcon, MD  DIAGNOSIS: 73 y.o. gentleman with Stage T1c adenocarcinoma of the prostate with Gleason score of 3+4, and PSA of 10.8.     ICD-10-CM   1. Malignant neoplasm of prostate ([place])  C61     COMPLEX SIMULATION:  The patient presented today for evaluation for possible prostate seed implant. He was brought to the radiation planning suite and placed supine on the CT couch. A 3-dimensional image study set was obtained in upload to the planning computer. There, on each axial slice, I contoured the prostate gland. Then, using three-dimensional radiation planning tools I reconstructed the prostate in view of the structures from the transperineal needle pathway to assess for possible pubic arch interference. In doing so, I did not appreciate any pubic arch interference. Also, the patient's prostate volume was estimated based on the drawn structure. The volume was 57 cc.  Given the pubic arch appearance and prostate volume, patient remains a good candidate to proceed with prostate seed implant. Today, he freely provided informed written consent to proceed.    PLAN: The patient will undergo prostate seed implant.   ________________________________  Sheral Apley. Tammi Klippel, M.D.

## 2018-08-11 NOTE — Anesthesia Preprocedure Evaluation (Deleted)

## 2018-08-12 ENCOUNTER — Ambulatory Visit: Payer: Medicare Other | Admitting: Podiatry

## 2018-08-15 ENCOUNTER — Other Ambulatory Visit: Payer: Self-pay | Admitting: Internal Medicine

## 2018-08-15 DIAGNOSIS — I10 Essential (primary) hypertension: Secondary | ICD-10-CM

## 2018-08-22 ENCOUNTER — Other Ambulatory Visit: Payer: Self-pay

## 2018-08-22 DIAGNOSIS — M4800 Spinal stenosis, site unspecified: Secondary | ICD-10-CM

## 2018-08-22 MED ORDER — HYDROCODONE-ACETAMINOPHEN 10-325 MG PO TABS: 1.0000 | ORAL_TABLET | Freq: Three times a day (TID) | ORAL | 0 refills | Status: DC | PRN

## 2018-08-22 NOTE — Telephone Encounter (Signed)
RX last written on 07/26/18 UDS  03/30/18 LOV in [facility]  08/03/18 LOV w/ PCP 03/30/18

## 2018-08-22 NOTE — Telephone Encounter (Signed)
HYDROcodone-acetaminophen (NORCO) 10-325 MG tablet   Refill request @  [place] [facility] [place], [place] AT [address] [phone] (Phone) [phone] (Fax)

## 2018-09-08 ENCOUNTER — Telehealth: Payer: Self-pay | Admitting: *Deleted

## 2018-09-08 NOTE — Telephone Encounter (Signed)
CALLED PATIENT TO INFORM OF NEW IMPLANT DATE ON 10-28-18 @ 7:30 AM, IMPLANT MOVED DUE TO DR. MANNING HAVING AN ALL DAY CONFLICT ON 8-47-30, LVM FOR A RETURN CALL

## 2018-09-12 ENCOUNTER — Telehealth: Payer: Self-pay | Admitting: *Deleted

## 2018-09-12 NOTE — Telephone Encounter (Signed)
CALLED PATIENT TO INFORM THAT IMPLANT HAS BEEN MOVED, LVM FOR A RETURN CALL

## 2018-09-12 NOTE — Telephone Encounter (Signed)
CALLED PATIENT TO INFORM THAT IMPLANT HAS BEEN MOVED TO 10-28-18 @ 7:30 AM , SPOKE WITH PATIENT AND HE IS AWARE OF THIS PROCEDURE.

## 2018-09-14 NOTE — Addendum Note (Signed)
Addended by: Hulan Fray on: 09/14/2018 08:36 PM   Modules accepted: Orders

## 2018-09-20 ENCOUNTER — Other Ambulatory Visit: Payer: Self-pay

## 2018-09-20 DIAGNOSIS — M4800 Spinal stenosis, site unspecified: Secondary | ICD-10-CM

## 2018-09-20 NOTE — Telephone Encounter (Signed)
HYDROcodone-acetaminophen (NORCO) 10-325 MG tablet   Refill request @  [facility] [place] [facility] [place], [place] AT [address] [phone] (Phone) [phone] (Fax)

## 2018-09-21 MED ORDER — HYDROCODONE-ACETAMINOPHEN 10-325 MG PO TABS: 1.0000 | ORAL_TABLET | Freq: Three times a day (TID) | ORAL | 0 refills | Status: DC | PRN

## 2018-10-18 ENCOUNTER — Other Ambulatory Visit: Payer: Self-pay

## 2018-10-18 DIAGNOSIS — M4800 Spinal stenosis, site unspecified: Secondary | ICD-10-CM

## 2018-10-18 MED ORDER — HYDROCODONE-ACETAMINOPHEN 10-325 MG PO TABS: 1.0000 | ORAL_TABLET | Freq: Three times a day (TID) | ORAL | 0 refills | Status: DC | PRN

## 2018-10-18 NOTE — Telephone Encounter (Signed)
HYDROcodone-acetaminophen (NORCO) 10-325 MG tablet   Refill request @  [facility] [place], [address] AT [address] [phone] (Phone) [phone] (Fax)

## 2018-10-24 ENCOUNTER — Telehealth: Payer: Self-pay | Admitting: *Deleted

## 2018-10-24 NOTE — Telephone Encounter (Signed)
CALLED PATIENT TO INFORM THAT SEEDS HAVE BEEN ORDERED, LVM FOR A RETURN CALL

## 2018-10-24 NOTE — Telephone Encounter (Signed)
CALLED PATIENT TO ASK QUESTION, LVM FOR A RETURN CALL 

## 2018-10-25 ENCOUNTER — Encounter (HOSPITAL_COMMUNITY)
Admission: RE | Admit: 2018-10-25 | Discharge: 2018-10-25 | Disposition: A | Discharge status: Home / Self Care | Payer: Medicare Other | Source: Ambulatory Visit | Attending: Urology | Admitting: Urology

## 2018-10-25 ENCOUNTER — Other Ambulatory Visit: Payer: Self-pay

## 2018-10-25 ENCOUNTER — Other Ambulatory Visit (HOSPITAL_COMMUNITY)
Admission: RE | Admit: 2018-10-25 | Discharge: 2018-10-25 | Disposition: A | Discharge status: Home / Self Care | Payer: Medicare Other | Source: Ambulatory Visit | Attending: Urology | Admitting: Urology

## 2018-10-25 ENCOUNTER — Other Ambulatory Visit: Payer: Self-pay | Admitting: Urology

## 2018-10-25 ENCOUNTER — Telehealth: Payer: Self-pay | Admitting: *Deleted

## 2018-10-25 DIAGNOSIS — Z20828 Contact with and (suspected) exposure to other viral communicable diseases: Secondary | ICD-10-CM | POA: Insufficient documentation

## 2018-10-25 DIAGNOSIS — Z01812 Encounter for preprocedural laboratory examination: Secondary | ICD-10-CM | POA: Insufficient documentation

## 2018-10-25 LAB — CBC
HCT: 36.8 % — ABNORMAL LOW (ref 39.0–52.0)
Hemoglobin: 11.5 g/dL — ABNORMAL LOW (ref 13.0–17.0)
MCH: 29 pg (ref 26.0–34.0)
MCHC: 31.3 g/dL (ref 30.0–36.0)
MCV: 92.9 fL (ref 80.0–100.0)
Platelets: 206 10*3/uL (ref 150–400)
RBC: 3.96 MIL/uL — ABNORMAL LOW (ref 4.22–5.81)
RDW: 14.7 % (ref 11.5–15.5)
WBC: 4.1 10*3/uL (ref 4.0–10.5)
nRBC: 0 % (ref 0.0–0.2)

## 2018-10-25 LAB — COMPREHENSIVE METABOLIC PANEL
ALT: 20 U/L (ref 0–44)
AST: 24 U/L (ref 15–41)
Albumin: 4.2 g/dL (ref 3.5–5.0)
Alkaline Phosphatase: 63 U/L (ref 38–126)
Anion gap: 7 (ref 5–15)
BUN: 21 mg/dL (ref 8–23)
CO2: 27 mmol/L (ref 22–32)
Calcium: 9.4 mg/dL (ref 8.9–10.3)
Chloride: 106 mmol/L (ref 98–111)
Creatinine, Ser: 1.18 mg/dL (ref 0.61–1.24)
GFR calc Af Amer: >60 mL/min (ref >60)
GFR calc non Af Amer: >60 mL/min (ref >60)
Glucose, Bld: 103 mg/dL — ABNORMAL HIGH (ref 70–99)
Potassium: 3.3 mmol/L — ABNORMAL LOW (ref 3.5–5.1)
Sodium: 140 mmol/L (ref 135–145)
Total Bilirubin: 0.9 mg/dL (ref 0.3–1.2)
Total Protein: 7.7 g/dL (ref 6.5–8.1)

## 2018-10-25 LAB — SARS CORONAVIRUS 2 (TAT 6-24 HRS): SARS Coronavirus 2: NEGATIVE

## 2018-10-25 LAB — PROTIME-INR
INR: 1 (ref 0.8–1.2)
Prothrombin Time: 13 seconds (ref 11.4–15.2)

## 2018-10-25 LAB — APTT: aPTT: 27 seconds (ref 24–36)

## 2018-10-25 NOTE — Telephone Encounter (Signed)
CALLED PATIENT TO ASK QUESTION, LVM FOR A RETURN CALL 

## 2018-10-27 ENCOUNTER — Telehealth: Payer: Self-pay | Admitting: *Deleted

## 2018-10-27 ENCOUNTER — Other Ambulatory Visit: Payer: Self-pay

## 2018-10-27 ENCOUNTER — Encounter (HOSPITAL_BASED_OUTPATIENT_CLINIC_OR_DEPARTMENT_OTHER): Payer: Self-pay | Admitting: *Deleted

## 2018-10-27 NOTE — Telephone Encounter (Signed)
CALLED PATIENT TO REMIND OF PROCEDURE FOR 10-28-18, LVM FOR A RETURN CALL

## 2018-10-27 NOTE — Progress Notes (Signed)
Spoke with Ahman, npo after midnight, arrive 530 am [facility] 10-28-18. meds to take sip of water: amlodipine, hydrocodone prn. Take fleets enema am 10-28-18. Records on chart/epic: ekg and chest xray done 08-11-18, labs done 10-25-18: cbc, cmet, pt, ptt. Has surgery orders in epic. Driver cousin Pia Mau, patient to bring her number 10-29-18.

## 2018-10-27 NOTE — H&P (Signed)
HPI: Jeremy Hill is a 73 year-old male with prostate cancer.  His prostate cancer was diagnosed 05/05/2018. His PSA at his time of diagnosis was 10.8. His cancer was T1c, 3+4.   Adenocarcinoma of the prostate:  PSA -10.8, DRE-benign  TRUS/Bx 05/05/18: Prostate volume-58 cc  Pathology: Adenocarcinoma Gleason 3+4=7 in 5 cores and 3+3=6 in 2 cores for a total of 7/12 cores positive.  Stage: T1c   06/23/18: After consultation with Dr. Tammi Klippel he has elected to proceed with radiation. His decision was to proceed with radioactive seed implantation.     ALLERGIES: No Allergies    MEDICATIONS: Finasteride 5 mg tablet  Acetazolamide 250 mg tablet  Amlodipine Besylate 10 mg tablet Oral  Atorvastatin Calcium 10 mg tablet  Brimonidine Tartrate 0.2 % drops  Cyclobenzaprine Hcl 7.5 mg tablet  Hydrochlorothiazide 25 mg tablet Oral  Hydrocodone-Acetaminophen 10 mg-325 mg tablet  Lisinopril 10 mg tablet Oral  Meloxicam 7.5 mg tablet  Tamsulosin Hcl 0.4 mg capsule Oral  Timolol Maleate 0.5 % drops     GU PSH: Prostate Needle Biopsy - 05/05/2018      PSH Notes: Nose Surgery   NON-GU PSH: Surgical Pathology, Gross And Microscopic Examination For Prostate Needle - 05/05/2018    GU PMH: Prostate Cancer, At this point he has elected to proceed with radiation with radioactive seed implantation and therefore will be scheduled for an appointment with the radiation oncologist. - 05/12/2018 Elevated PSA (Stable), I have discussed with the patient the possibility of blood per rectum, per urethra and in the ejaculate. He was counseled to contact me if he has any difficulties following his prostate biopsy whatsoever. - 05/05/2018, (Worsening), I noted no worrisome findings on examination of his prostate but his PSA has increased significantly and therefore I have recommended we proceed with a prostate biopsy. He is in agreement with, - 04/14/2018, Elevated PSA, - 2017 BPH w/LUTS (Stable), He has BPH but does not have  significant obstructive symptoms. He has more symptoms that are irritative in nature. He did not see much improvement on tamsulosin. He just started finasteride and I am not sure he is going to see any further improvement with this medication but I did not have him stop it yet. - 04/14/2018 Nocturia, He has nocturia x3, frequency and urgency with a good force of urinary stream. I think he would benefit from Myrbetriq. I have given him samples today and he is going to let me know how this works for him. - 04/14/2018 Spermatocele (single), Spermatocele of epididymis, single - 2017      PMH Notes: Right spermatocele: He was found to have a right spermatocele by scrotal ultrasound by Dr. Rosana Hoes in 6/08.   Elevated PSA: His PSA in 3/17 and was found to be elevated at 6.8. No family history of prostate cancer and no abnormality noted on DRE.   LUTS: He did report nocturia 3 with daytime frequency and a feeling of incomplete emptying. He was treated with tamsulosin and reported that his frequency and urgency as well as nocturia have resolved.         NON-GU PMH: Personal history of other diseases of the circulatory system, History of hypertension - 2017 Arthritis Hypercholesterolemia Hypertension    FAMILY HISTORY: cardiac disorder - Runs In Family Death of family member - Runs In Family   SOCIAL HISTORY: Marital Status: Single Preferred Language: English; Ethnicity: Not Hispanic Or Latino; Race: Black or African American Current Smoking Status: Patient has never smoked.  Tobacco Use Assessment Completed: Used Tobacco in last 30 days? Does not use smokeless tobacco. Does not drink anymore.  Does not use drugs. Drinks 2 caffeinated drinks per day.     Notes: Alcohol use, Single, Number of children, Never a smoker, Retired, Caffeine use   REVIEW OF SYSTEMS:    GU Review Male:   Patient denies frequent urination, hard to postpone urination, burning/ pain with urination, get up at night to  urinate, leakage of urine, stream starts and stops, trouble starting your stream, have to strain to urinate , erection problems, and penile pain.  Gastrointestinal (Upper):   Patient denies nausea, vomiting, and indigestion/ heartburn.  Gastrointestinal (Lower):   Patient denies diarrhea and constipation.  Constitutional:   Patient denies fever, night sweats, weight loss, and fatigue.  Skin:   Patient denies skin rash/ lesion and itching.  Eyes:   Patient denies blurred vision and double vision.  Ears/ Nose/ Throat:   Patient denies sore throat and sinus problems.  Hematologic/Lymphatic:   Patient denies swollen glands and easy bruising.  Cardiovascular:   Patient denies leg swelling and chest pains.  Respiratory:   Patient denies cough and shortness of breath.  Endocrine:   Patient denies excessive thirst.  Musculoskeletal:   Patient denies back pain and joint pain.  Neurological:   Patient denies headaches and dizziness.  Psychologic:   Patient denies depression and anxiety.   VITAL SIGNS:     Weight 221 lb / 100.24 kg  Height 73 in / 185.42 cm  BP 183/103 mmHg  Pulse 70 /min  BMI 29.2 kg/m   Physical Exam  Constitutional: Well nourished and well developed . No acute distress.   ENT:. The ears and nose are normal in appearance.   Neck: The appearance of the neck is normal and no neck mass is present.   Pulmonary: No respiratory distress and normal respiratory rhythm and effort.   Cardiovascular: Heart rate and rhythm are normal . No peripheral edema.   Abdomen: The abdomen is soft and nontender. No masses are palpated. No CVA tenderness. No hernias are palpable. No hepatosplenomegaly noted.   Rectal: Rectal exam demonstrates normal sphincter tone, no tenderness and no masses. The prostate has no nodularity and is not tender. The left seminal vesicle is nonpalpable. The right seminal vesicle is nonpalpable. The perineum is normal on inspection.   Genitourinary: Examination of  the penis demonstrates no discharge, no masses, no lesions and a normal meatus. The scrotum is without lesions. The right epididymis is palpably normal and non-tender. The left epididymis is palpably normal and non-tender. The right testis is non-tender and without masses. The left testis is non-tender and without masses. He has depigmentation of the scrotal skin.   Lymphatics: The femoral and inguinal nodes are not enlarged or tender.   Skin: Normal skin turgor, no visible rash and no visible skin lesions.   Neuro/Psych:. Mood and affect are appropriate.    PAST DATA REVIEWED:  Source Of History:  Patient, Healthcare Provider  Records Review:   Previous Hospital Records, Previous Patient Records   05/13/15  PSA  Total PSA 6.8 ng/dl    PROCEDURES:          Urinalysis Dipstick Dipstick Cont'd  Color: Yellow Bilirubin: Neg mg/dL  Appearance: Clear Ketones: Neg mg/dL  Specific Gravity: 1.025 Blood: Neg ery/uL  pH: 6.0 Protein: Neg mg/dL  Glucose: Neg mg/dL Urobilinogen: 0.2 mg/dL    Nitrites: Neg    Leukocyte Esterase: Neg leu/uL  ASSESSMENT/PLAN:      ICD-10 Details  1 GU:   Prostate Cancer - C61 Stable - He received a 3 month Lupron injection on 06/23/18. He will be scheduled for radioactive seed implant.           I told in his particular case he has intermediate risk prostate cancer and it is known that it with high risk/high-grade cancer neoadjuvant androgen deprivation does result in improved survival with radiation. While this benefit has not been clearly demonstrated with intermediate and low risk prostate cancer there is the potential for improved survival and using androgen deprivation would also significantly reduce the risk of disease progression while awaiting therapy due to COVID-19. We therefore discussed the mechanism of action of this medication as well as the potential side effects that are caused by the medication. I have recommended he begin vitamin D and  calcium.

## 2018-10-28 ENCOUNTER — Encounter (HOSPITAL_BASED_OUTPATIENT_CLINIC_OR_DEPARTMENT_OTHER)
Admission: RE | Disposition: A | Discharge status: Home / Self Care | Payer: Self-pay | Source: Home / Self Care | Attending: Urology

## 2018-10-28 ENCOUNTER — Encounter (HOSPITAL_BASED_OUTPATIENT_CLINIC_OR_DEPARTMENT_OTHER): Payer: Self-pay

## 2018-10-28 ENCOUNTER — Ambulatory Visit (HOSPITAL_BASED_OUTPATIENT_CLINIC_OR_DEPARTMENT_OTHER)
Admission: RE | Admit: 2018-10-28 | Discharge: 2018-10-28 | Disposition: A | Discharge status: Home / Self Care | Payer: Medicare Other | Attending: Urology | Admitting: Urology

## 2018-10-28 ENCOUNTER — Ambulatory Visit (HOSPITAL_BASED_OUTPATIENT_CLINIC_OR_DEPARTMENT_OTHER): Payer: Medicare Other | Admitting: Anesthesiology

## 2018-10-28 ENCOUNTER — Ambulatory Visit (HOSPITAL_COMMUNITY): Payer: Medicare Other

## 2018-10-28 DIAGNOSIS — E7800 Pure hypercholesterolemia, unspecified: Secondary | ICD-10-CM | POA: Insufficient documentation

## 2018-10-28 DIAGNOSIS — I10 Essential (primary) hypertension: Secondary | ICD-10-CM | POA: Insufficient documentation

## 2018-10-28 DIAGNOSIS — E785 Hyperlipidemia, unspecified: Secondary | ICD-10-CM | POA: Diagnosis not present

## 2018-10-28 DIAGNOSIS — F329 Major depressive disorder, single episode, unspecified: Secondary | ICD-10-CM | POA: Insufficient documentation

## 2018-10-28 DIAGNOSIS — C61 Malignant neoplasm of prostate: Secondary | ICD-10-CM | POA: Diagnosis present

## 2018-10-28 HISTORY — PX: SPACE OAR INSTILLATION: SHX6769

## 2018-10-28 HISTORY — PX: CYSTOSCOPY: SHX5120

## 2018-10-28 HISTORY — PX: RADIOACTIVE SEED IMPLANT: SHX5150

## 2018-10-28 SURGERY — INSERTION, RADIATION SOURCE, PROSTATE
Anesthesia: General | Site: Rectum

## 2018-10-28 SURGERY — INSERTION, RADIATION SOURCE, PROSTATE
Anesthesia: General

## 2018-10-28 MED ORDER — DEXAMETHASONE SODIUM PHOSPHATE 10 MG/ML IJ SOLN
INTRAMUSCULAR | Status: AC
  Filled 2018-10-28: qty 1

## 2018-10-28 MED ORDER — MIDAZOLAM HCL 2 MG/2ML IJ SOLN
INTRAMUSCULAR | Status: AC
  Filled 2018-10-28: qty 2

## 2018-10-28 MED ORDER — SODIUM CHLORIDE (PF) 0.9 % IJ SOLN
INTRAMUSCULAR | Status: DC | PRN
  Administered 2018-10-28: 10 mL

## 2018-10-28 MED ORDER — KETOROLAC TROMETHAMINE 30 MG/ML IJ SOLN
INTRAMUSCULAR | Status: AC
  Filled 2018-10-28: qty 1

## 2018-10-28 MED ORDER — SODIUM CHLORIDE 0.9 % IR SOLN
Status: DC | PRN
  Administered 2018-10-28: 1000 mL via INTRAVESICAL

## 2018-10-28 MED ORDER — MIDAZOLAM HCL 2 MG/2ML IJ SOLN
INTRAMUSCULAR | Status: DC | PRN
  Administered 2018-10-28: 2 mg via INTRAVENOUS

## 2018-10-28 MED ORDER — MEPERIDINE HCL 25 MG/ML IJ SOLN
6.2500 mg | INTRAMUSCULAR | Status: DC | PRN
  Filled 2018-10-28: qty 1

## 2018-10-28 MED ORDER — IOHEXOL 300 MG/ML  SOLN
INTRAMUSCULAR | Status: DC | PRN
  Administered 2018-10-28: 7 mL

## 2018-10-28 MED ORDER — KETOROLAC TROMETHAMINE 30 MG/ML IJ SOLN
INTRAMUSCULAR | Status: DC | PRN
  Administered 2018-10-28: 30 mg via INTRAVENOUS

## 2018-10-28 MED ORDER — HYDROCODONE-ACETAMINOPHEN 10-325 MG PO TABS: 1.0000 | ORAL_TABLET | ORAL | 0 refills | Status: DC | PRN

## 2018-10-28 MED ORDER — FENTANYL CITRATE (PF) 100 MCG/2ML IJ SOLN
INTRAMUSCULAR | Status: AC
  Filled 2018-10-28: qty 2

## 2018-10-28 MED ORDER — DEXAMETHASONE SODIUM PHOSPHATE 10 MG/ML IJ SOLN
INTRAMUSCULAR | Status: DC | PRN
  Administered 2018-10-28: 10 mg via INTRAVENOUS

## 2018-10-28 MED ORDER — PROPOFOL 10 MG/ML IV BOLUS
INTRAVENOUS | Status: DC | PRN
  Administered 2018-10-28: 120 mg via INTRAVENOUS

## 2018-10-28 MED ORDER — LIDOCAINE 2% (20 MG/ML) 5 ML SYRINGE
INTRAMUSCULAR | Status: AC
  Filled 2018-10-28: qty 5

## 2018-10-28 MED ORDER — PROPOFOL 10 MG/ML IV BOLUS
INTRAVENOUS | Status: AC
  Filled 2018-10-28: qty 40

## 2018-10-28 MED ORDER — ONDANSETRON HCL 4 MG/2ML IJ SOLN
INTRAMUSCULAR | Status: AC
  Filled 2018-10-28: qty 2

## 2018-10-28 MED ORDER — CIPROFLOXACIN IN D5W 400 MG/200ML IV SOLN
400.0000 mg | INTRAVENOUS | Status: AC
  Administered 2018-10-28: 400 mg via INTRAVENOUS
  Filled 2018-10-28: qty 200

## 2018-10-28 MED ORDER — ARTIFICIAL TEARS OPHTHALMIC OINT
TOPICAL_OINTMENT | OPHTHALMIC | Status: AC
  Filled 2018-10-28: qty 3.5

## 2018-10-28 MED ORDER — ONDANSETRON HCL 4 MG/2ML IJ SOLN
INTRAMUSCULAR | Status: DC | PRN
  Administered 2018-10-28: 4 mg via INTRAVENOUS

## 2018-10-28 MED ORDER — FLEET ENEMA 7-19 GM/118ML RE ENEM
1.0000 | ENEMA | Freq: Once | RECTAL | Status: DC
  Filled 2018-10-28: qty 1

## 2018-10-28 MED ORDER — EPHEDRINE SULFATE 50 MG/ML IJ SOLN
INTRAMUSCULAR | Status: DC | PRN
  Administered 2018-10-28: 10 mg via INTRAVENOUS
  Administered 2018-10-28: 5 mg via INTRAVENOUS

## 2018-10-28 MED ORDER — EPHEDRINE 5 MG/ML INJ
INTRAVENOUS | Status: AC
  Filled 2018-10-28: qty 10

## 2018-10-28 MED ORDER — STERILE WATER FOR INJECTION IJ SOLN
INTRAMUSCULAR | Status: DC | PRN
  Administered 2018-10-28: 08:00:00 3 mL

## 2018-10-28 MED ORDER — ACETAMINOPHEN 500 MG PO TABS
ORAL_TABLET | ORAL | Status: AC
  Filled 2018-10-28: qty 2

## 2018-10-28 MED ORDER — LACTATED RINGERS IV SOLN
INTRAVENOUS | Status: DC
  Administered 2018-10-28 (×2): via INTRAVENOUS
  Filled 2018-10-28: qty 1000

## 2018-10-28 MED ORDER — ACETAMINOPHEN 500 MG PO TABS
1000.0000 mg | ORAL_TABLET | Freq: Once | ORAL | Status: AC
  Administered 2018-10-28: 07:00:00 1000 mg via ORAL
  Filled 2018-10-28: qty 2

## 2018-10-28 MED ORDER — FENTANYL CITRATE (PF) 100 MCG/2ML IJ SOLN
INTRAMUSCULAR | Status: DC | PRN
  Administered 2018-10-28 (×2): 50 ug via INTRAVENOUS

## 2018-10-28 MED ORDER — MIDAZOLAM HCL 2 MG/2ML IJ SOLN
0.5000 mg | Freq: Once | INTRAMUSCULAR | Status: DC | PRN
  Filled 2018-10-28: qty 2

## 2018-10-28 MED ORDER — CIPROFLOXACIN IN D5W 400 MG/200ML IV SOLN
INTRAVENOUS | Status: AC
  Filled 2018-10-28: qty 200

## 2018-10-28 MED ORDER — FENTANYL CITRATE (PF) 100 MCG/2ML IJ SOLN
25.0000 ug | INTRAMUSCULAR | Status: DC | PRN
  Filled 2018-10-28: qty 1

## 2018-10-28 MED ORDER — PROMETHAZINE HCL 25 MG/ML IJ SOLN
6.2500 mg | INTRAMUSCULAR | Status: DC | PRN
  Filled 2018-10-28: qty 1

## 2018-10-28 MED ORDER — LIDOCAINE 2% (20 MG/ML) 5 ML SYRINGE
INTRAMUSCULAR | Status: DC | PRN
  Administered 2018-10-28: 60 mg via INTRAVENOUS

## 2018-10-28 SURGICAL SUPPLY — 43 items
BAG URINE DRAINAGE (UROLOGICAL SUPPLIES) ×4 IMPLANT
BLADE CLIPPER SENSICLIP SURGIC (BLADE) ×4 IMPLANT
CATH FOLEY 2WAY SLVR  5CC 16FR (CATHETERS) ×1
CATH FOLEY 2WAY SLVR 5CC 16FR (CATHETERS) ×3 IMPLANT
CATH ROBINSON RED A/P 16FR (CATHETERS) IMPLANT
CATH ROBINSON RED A/P 20FR (CATHETERS) ×4 IMPLANT
CLOTH BEACON ORANGE TIMEOUT ST (SAFETY) ×3 IMPLANT
CONT SPECI 4OZ STER CLIK (MISCELLANEOUS) ×8 IMPLANT
COVER BACK TABLE 60X90IN (DRAPES) ×4 IMPLANT
COVER MAYO STAND STRL (DRAPES) ×4 IMPLANT
COVER WAND RF STERILE (DRAPES) ×3 IMPLANT
DRSG TEGADERM 4X4.75 (GAUZE/BANDAGES/DRESSINGS) ×7 IMPLANT
DRSG TEGADERM 8X12 (GAUZE/BANDAGES/DRESSINGS) ×7 IMPLANT
GAUZE SPONGE 4X4 12PLY STRL LF (GAUZE/BANDAGES/DRESSINGS) ×1 IMPLANT
GLOVE BIO SURGEON STRL SZ 6 (GLOVE) IMPLANT
GLOVE BIO SURGEON STRL SZ 6.5 (GLOVE) IMPLANT
GLOVE BIO SURGEON STRL SZ7 (GLOVE) IMPLANT
GLOVE BIO SURGEON STRL SZ8 (GLOVE) ×4 IMPLANT
GLOVE BIOGEL PI IND STRL 6 (GLOVE) IMPLANT
GLOVE BIOGEL PI IND STRL 6.5 (GLOVE) IMPLANT
GLOVE BIOGEL PI IND STRL 7.0 (GLOVE) IMPLANT
GLOVE BIOGEL PI IND STRL 8 (GLOVE) IMPLANT
GLOVE BIOGEL PI INDICATOR 6 (GLOVE)
GLOVE BIOGEL PI INDICATOR 6.5 (GLOVE)
GLOVE BIOGEL PI INDICATOR 7.0 (GLOVE)
GLOVE BIOGEL PI INDICATOR 8 (GLOVE)
GLOVE SURG ORTHO 8.5 STRL (GLOVE) ×6 IMPLANT
GOWN STRL REUS W/TWL XL LVL3 (GOWN DISPOSABLE) ×4 IMPLANT
HOLDER FOLEY CATH W/STRAP (MISCELLANEOUS) IMPLANT
I-Seed AgX100 ×70 IMPLANT
IMPL SPACEOAR SYSTEM 10ML (Spacer) ×3 IMPLANT
IMPLANT SPACEOAR SYSTEM 10ML (Spacer) ×4 IMPLANT
IV NS 1000ML (IV SOLUTION) ×4
IV NS 1000ML BAXH (IV SOLUTION) ×3 IMPLANT
KIT TURNOVER CYSTO (KITS) ×4 IMPLANT
MARKER SKIN DUAL TIP RULER LAB (MISCELLANEOUS) ×4 IMPLANT
PACK CYSTO (CUSTOM PROCEDURE TRAY) ×4 IMPLANT
SURGILUBE 2OZ TUBE FLIPTOP (MISCELLANEOUS) ×4 IMPLANT
SUT BONE WAX W31G (SUTURE) IMPLANT
SYR 10ML LL (SYRINGE) IMPLANT
TOWEL OR 17X26 10 PK STRL BLUE (TOWEL DISPOSABLE) ×7 IMPLANT
UNDERPAD 30X30 (UNDERPADS AND DIAPERS) ×8 IMPLANT
WATER STERILE IRR 500ML POUR (IV SOLUTION) ×4 IMPLANT

## 2018-10-28 NOTE — Transfer of Care (Signed)
Immediate Anesthesia Transfer of Care Note  Patient: Jeremy Hill  Procedure(s) Performed: RADIOACTIVE SEED IMPLANT/BRACHYTHERAPY IMPLANT (N/A Prostate) SPACE OAR INSTILLATION (N/A Rectum) CYSTOSCOPY (N/A Bladder)  Patient Location: PACU  Anesthesia Type:General  Level of Consciousness: awake, alert , oriented and patient cooperative  Airway & Oxygen Therapy: Patient Spontanous Breathing and Patient connected to nasal cannula oxygen  Post-op Assessment: Report given to RN and Post -op Vital signs reviewed and stable  Post vital signs: Reviewed and stable  Last Vitals:  Vitals Value Taken Time  BP    Temp    Pulse 76 10/28/18 0906  Resp 14 10/28/18 0906  SpO2 98 % 10/28/18 0906  Vitals shown include unvalidated device data.  Last Pain:  Vitals:   10/28/18 0559  TempSrc:   PainSc: 0-No pain      Patients Stated Pain Goal: 6 ([id number])  Complications: No apparent anesthesia complications

## 2018-10-28 NOTE — Progress Notes (Signed)
  Radiation Oncology         [phone] ________________________________  Name: MCDONALD SHANKEL MRN: [id number]  Date: 10/28/2018  DOB: Jan 06, 1946       Prostate Seed Implant  [id number], Peri Jefferson, MD  No ref. provider found  DIAGNOSIS: 73 y.o. gentleman with Stage T1c adenocarcinoma of the prostate with Gleason score of 3+4, and PSA of 10.8    ICD-10-CM   1. Prostate cancer ([place])  C61 DG Chest 2 View    DG Chest 2 View    Discharge patient    PROCEDURE: Insertion of radioactive I-125 seeds into the prostate gland.  RADIATION DOSE: 145 Gy, definitive therapy.  TECHNIQUE: ARNEL EBEN was brought to the operating room with the urologist. He was placed in the dorsolithotomy position. He was catheterized and a rectal tube was inserted. The perineum was shaved, prepped and draped. The ultrasound probe was then introduced into the rectum to see the prostate gland.  TREATMENT DEVICE: A needle grid was attached to the ultrasound probe stand and anchor needles were placed.  3D PLANNING: The prostate was imaged in 3D using a sagittal sweep of the prostate probe. These images were transferred to the planning computer. There, the prostate, urethra and rectum were defined on each axial reconstructed image. Then, the software created an optimized 3D plan and a few seed positions were adjusted. The quality of the plan was reviewed using [facility] information for the target and the following two organs at risk:  Urethra and Rectum.  Then the accepted plan was printed and handed off to the radiation therapist.  Under my supervision, the custom loading of the seeds and spacers was carried out and loaded into sealed vicryl sleeves.  These pre-loaded needles were then placed into the needle holder.Marland Kitchen  PROSTATE VOLUME STUDY:  Using transrectal ultrasound the volume of the prostate was verified to be 53.7 cc.  SPECIAL TREATMENT PROCEDURE/SUPERVISION AND HANDLING: The pre-loaded needles were then delivered under  sagittal guidance. A total of 25 needles were used to deposit 70 seeds in the prostate gland. The individual seed activity was 0.618 mCi.  SpaceOAR:  yes  COMPLEX SIMULATION: At the end of the procedure, an anterior radiograph of the pelvis was obtained to document seed positioning and count. Cystoscopy was performed to check the urethra and bladder.  MICRODOSIMETRY: At the end of the procedure, the patient was emitting 0.05 mR/hr at 1 meter. Accordingly, he was considered safe for hospital discharge.  PLAN: The patient will return to the radiation oncology clinic for post implant CT dosimetry in three weeks.   ________________________________  Sheral Apley Tammi Klippel, M.D.

## 2018-10-28 NOTE — Op Note (Signed)
PATIENT:  Jeremy Hill  PRE-OPERATIVE DIAGNOSIS:  Adenocarcinoma of the prostate  POST-OPERATIVE DIAGNOSIS:  Same  PROCEDURE:  1. I-125 radioactive seed implantation 2. Cystoscopy  3. Placement of SpaceOAR 4. Fluoroscopy use with time less than 1 hour.  SURGEON:  Surgeon(s): Claybon Jabs  Radiation oncologist: Dr. Tyler Pita  ANESTHESIA:  General  EBL:  Minimal  DRAINS: None  INDICATION: Jeremy Hill is a 73 year old male with biopsy-proven adenocarcinoma the prostate stage T1c Gleason 3+4 with a PSA of 10.8.  He has elected to proceed with radioactive seed implant for treatment of his prostate cancer.  Description of procedure: After informed consent the patient was brought to the major OR, placed on the table and administered general anesthesia. He was then moved to the modified lithotomy position with his perineum perpendicular to the floor. His perineum and genitalia were then sterilely prepped. An official timeout was then performed. A 16 French Foley catheter was then placed in the bladder and filled with dilute contrast, a rectal tube was placed in the rectum and the transrectal ultrasound probe was placed in the rectum and affixed to the stand. He was then sterilely draped.  Real time ultrasonography was used along with the seed planning software [employer] Prostate vs. 4.2.2.4. This was used to develop the seed plan including the number of needles as well as number of seeds required for complete and adequate coverage.  The needles were then preloaded with seeds and spacers according to the previously developed plan.  Real-time ultrasonography was then used along with the previously developed plan to implant a total of 70 seeds using 25 needles. This proceeded without difficulty or complication.   I then proceeded with placement of SpaceOAR by introducing a needle with the bevel angled inferiorly approximately 2 cm superior to the anus. This was angled downward and under  direct ultrasound was placed within the space between the prostatic capsule and rectum. This was confirmed with a small amount of sterile saline injected and this was performed under direct ultrasound. I then attached the SpaceOAR to the needle and injected this in the space between the prostate and rectum with good placement noted.  A Foley catheter was then removed as well as the transrectal ultrasound probe and rectal probe. Flexible cystoscopy was then performed using the 17 French flexible scope which revealed a normal urethra throughout its length down to the sphincter which appeared intact. The prostatic urethra revealed bilobar hypertrophy but no evidence of obstruction, seeds, spacers or lesions. The bladder was then entered and fully and systematically inspected. The ureteral orifices were noted to be of normal configuration and position. The mucosa revealed no evidence of tumors. There were also no stones identified within the bladder. I noted no seeds or spacers on the floor of the bladder and retroflexion of the scope revealed no seeds protruding from the base of the prostate.  C-arm fluoroscopy was then used to evaluate the distribution of seeds placement and aid in determining confirmation of the number of seeds placed.  Real-time fluoroscopy was used with saved images revealing the location of the seeds placed both in AP and oblique views.  The cystoscope was then removed and the patient was awakened and taken to recovery room in stable and satisfactory condition. He tolerated procedure well and there were no intraoperative complications.

## 2018-10-28 NOTE — Anesthesia Procedure Notes (Signed)
Procedure Name: LMA Insertion Date/Time: 10/28/2018 7:47 AM Performed by: Wanita Chamberlain, CRNA Pre-anesthesia Checklist: Emergency Drugs available, Patient identified, Suction available, Patient being monitored and Timeout performed Patient Re-evaluated:Patient Re-evaluated prior to induction Oxygen Delivery Method: Circle system utilized Preoxygenation: Pre-oxygenation with 100% oxygen Induction Type: IV induction Ventilation: Mask ventilation without difficulty LMA: LMA inserted LMA Size: 5.0 Number of attempts: 1 Placement Confirmation: breath sounds checked- equal and bilateral,  CO2 detector and positive ETCO2 Tube secured with: Tape Dental Injury: Teeth and Oropharynx as per pre-operative assessment

## 2018-10-28 NOTE — Discharge Instructions (Signed)

## 2018-10-28 NOTE — Anesthesia Preprocedure Evaluation (Addendum)
Anesthesia Evaluation  Patient identified by MRN, date of birth, ID band Patient awake    Reviewed: Allergy & Precautions, NPO status , Patient's Chart, lab work & pertinent test results  History of Anesthesia Complications Negative for: history of anesthetic complications  Airway Mallampati: I  TM Distance: >3 FB Neck ROM: Full    Dental  (+) Edentulous Upper, Edentulous Lower   Pulmonary neg pulmonary ROS,    breath sounds clear to auscultation       Cardiovascular hypertension, Pt. on medications (-) angina Rhythm:Regular Rate:Normal  11-Aug-2018  Sinus bradycardia Moderate voltage criteria for LVH, may be normal variant Nonspecific T wave abnormality Abnormal ECG   Neuro/Psych Depression negative neurological ROS     GI/Hepatic negative GI ROS, Neg liver ROS,   Endo/Other  negative endocrine ROS  Renal/GU    Prostate cancer    Musculoskeletal   Abdominal   Peds  Hematology negative hematology ROS (+)   Anesthesia Other Findings   Reproductive/Obstetrics                           Anesthesia Physical Anesthesia Plan  ASA: III  Anesthesia Plan: General   Post-op Pain Management:    Induction: Intravenous  PONV Risk Score and Plan: 2 and Treatment may vary due to age or medical condition, Ondansetron and Dexamethasone  Airway Management Planned: LMA  Additional Equipment:   Intra-op Plan:   Post-operative Plan:   Informed Consent: I have reviewed the patients History and Physical, chart, labs and discussed the procedure including the risks, benefits and alternatives for the proposed anesthesia with the patient or authorized representative who has indicated his/her understanding and acceptance.       Plan Discussed with: CRNA and Surgeon  Anesthesia Plan Comments:        Anesthesia Quick Evaluation

## 2018-10-28 NOTE — Anesthesia Postprocedure Evaluation (Signed)
Anesthesia Post Note  Patient: Jeremy Hill  Procedure(s) Performed: RADIOACTIVE SEED IMPLANT/BRACHYTHERAPY IMPLANT (N/A Prostate) SPACE OAR INSTILLATION (N/A Rectum) CYSTOSCOPY (N/A Bladder)     Patient location during evaluation: Phase II Anesthesia Type: General Level of consciousness: awake and alert, patient cooperative and oriented Pain management: pain level controlled Vital Signs Assessment: post-procedure vital signs reviewed and stable Respiratory status: spontaneous breathing, nonlabored ventilation and respiratory function stable Cardiovascular status: stable and blood pressure returned to baseline Postop Assessment: no apparent nausea or vomiting, adequate PO intake and able to ambulate Anesthetic complications: no    Last Vitals:  Vitals:   10/28/18 0930 10/28/18 1000  BP: (!) 154/97 (!) 144/88  Pulse: (!) 58 (!) 59  Resp: 14 15  Temp:    SpO2: 100% 94%    Last Pain:  Vitals:   10/28/18 1000  TempSrc:   PainSc: 0-No pain                 Kayci Belleville,E. Finnian Husted

## 2018-10-31 ENCOUNTER — Encounter (HOSPITAL_BASED_OUTPATIENT_CLINIC_OR_DEPARTMENT_OTHER): Payer: Self-pay | Admitting: Urology

## 2018-11-03 ENCOUNTER — Telehealth: Payer: Self-pay | Admitting: *Deleted

## 2018-11-03 NOTE — Telephone Encounter (Signed)
I saw the notes!  Thank you for the update!

## 2018-11-03 NOTE — Telephone Encounter (Signed)
Jeremy Hill called/left message he had receive seed implant for prostate ca on the 28th. [name] now he's at home and dong well

## 2018-11-16 ENCOUNTER — Telehealth: Payer: Self-pay | Admitting: *Deleted

## 2018-11-16 NOTE — Telephone Encounter (Signed)
CALLED PATIENT TO REMIND OF POST SEED APPTS. AND MRI FOR 11-17-18, SPOKE WITH PATIENT AND HE IS AWARE OF THESE APPTS.

## 2018-11-17 ENCOUNTER — Ambulatory Visit
Admission: RE | Admit: 2018-11-17 | Discharge: 2018-11-17 | Disposition: A | Discharge status: Home / Self Care | Payer: Medicare Other | Source: Ambulatory Visit | Attending: Urology | Admitting: Urology

## 2018-11-17 ENCOUNTER — Other Ambulatory Visit: Payer: Self-pay | Admitting: Urology

## 2018-11-17 ENCOUNTER — Encounter: Payer: Self-pay | Admitting: Medical Oncology

## 2018-11-17 ENCOUNTER — Other Ambulatory Visit: Payer: Self-pay

## 2018-11-17 ENCOUNTER — Encounter: Payer: Self-pay | Admitting: Urology

## 2018-11-17 ENCOUNTER — Ambulatory Visit (HOSPITAL_COMMUNITY)
Admission: RE | Admit: 2018-11-17 | Discharge: 2018-11-17 | Disposition: A | Discharge status: Home / Self Care | Payer: Medicare Other | Source: Ambulatory Visit | Attending: Urology | Admitting: Urology

## 2018-11-17 ENCOUNTER — Ambulatory Visit
Admission: RE | Admit: 2018-11-17 | Discharge: 2018-11-17 | Disposition: A | Discharge status: Home / Self Care | Payer: Medicare Other | Source: Ambulatory Visit | Attending: Radiation Oncology | Admitting: Radiation Oncology

## 2018-11-17 ENCOUNTER — Other Ambulatory Visit: Payer: Self-pay | Admitting: Internal Medicine

## 2018-11-17 VITALS — BP 146/93 | HR 60 | Temp 97.9°F | Resp 18 | Wt 219.1 lb

## 2018-11-17 DIAGNOSIS — R9721 Rising PSA following treatment for malignant neoplasm of prostate: Secondary | ICD-10-CM | POA: Diagnosis not present

## 2018-11-17 DIAGNOSIS — Z79899 Other long term (current) drug therapy: Secondary | ICD-10-CM | POA: Insufficient documentation

## 2018-11-17 DIAGNOSIS — M4800 Spinal stenosis, site unspecified: Secondary | ICD-10-CM

## 2018-11-17 DIAGNOSIS — C61 Malignant neoplasm of prostate: Secondary | ICD-10-CM

## 2018-11-17 DIAGNOSIS — Z923 Personal history of irradiation: Secondary | ICD-10-CM | POA: Insufficient documentation

## 2018-11-17 DIAGNOSIS — R3911 Hesitancy of micturition: Secondary | ICD-10-CM | POA: Insufficient documentation

## 2018-11-17 DIAGNOSIS — I10 Essential (primary) hypertension: Secondary | ICD-10-CM | POA: Insufficient documentation

## 2018-11-17 DIAGNOSIS — M4624 Osteomyelitis of vertebra, thoracic region: Secondary | ICD-10-CM | POA: Insufficient documentation

## 2018-11-17 DIAGNOSIS — Z803 Family history of malignant neoplasm of breast: Secondary | ICD-10-CM | POA: Insufficient documentation

## 2018-11-17 DIAGNOSIS — F329 Major depressive disorder, single episode, unspecified: Secondary | ICD-10-CM | POA: Diagnosis not present

## 2018-11-17 DIAGNOSIS — D709 Neutropenia, unspecified: Secondary | ICD-10-CM | POA: Diagnosis not present

## 2018-11-17 DIAGNOSIS — R350 Frequency of micturition: Secondary | ICD-10-CM | POA: Diagnosis not present

## 2018-11-17 MED ORDER — SILODOSIN 8 MG PO CAPS: 8.0000 mg | ORAL_CAPSULE | Freq: Every day | ORAL | 2 refills | Status: DC

## 2018-11-17 MED ORDER — HYDROCODONE-ACETAMINOPHEN 10-325 MG PO TABS: 1.0000 | ORAL_TABLET | Freq: Three times a day (TID) | ORAL | 0 refills | Status: DC | PRN

## 2018-11-17 NOTE — Telephone Encounter (Signed)
Needs refill on HYDROcodone-acetaminophen (NORCO) 10-325 MG tablet  ;pt contact [phone]

## 2018-11-17 NOTE — Progress Notes (Signed)
Jeremy Hill is here for a post -seed follow-up appointment today. He has a MRI schedule for today at 1000. Patient states that he goes to his urologist tomorrow.Patient reports some dysuria.Patient denies any hematuria. Patient reports nocturia x3.Patient states that he does not empty his bladder with urination. Patient reports a weak stream stream. Patient denies any leakage or urgency. Vitals:   11/17/18 0848  BP: (!) 146/93  Pulse: 60  Resp: 18  Temp: 97.9 F (36.6 C)  TempSrc: Oral  SpO2: 100%  Weight: 219 lb 2 oz (99.4 kg)

## 2018-11-17 NOTE — Progress Notes (Signed)
Radiation Oncology         [phone] ________________________________  Name: Jeremy Hill MRN: [id number]  Date: 11/17/2018  DOB: 1945-04-14  Post-Seed Follow-Up Visit Note  CC: Sid Falcon, MD  Sid Falcon, MD  Diagnosis:   73 y.o. gentleman with Stage T1c adenocarcinoma of the prostate with Gleason score of 3+4, and PSA of 10.8    ICD-10-CM   1. Malignant neoplasm of prostate ([place])  C61     Interval Since Last Radiation:  3 weeks 10/28/18:  Insertion of radioactive I-125 seeds into the prostate gland; 145 Gy, definitive/boost therapy with placement of SpaceOAR gel. (concurrent with ST-ADT- recieved 3 month Lupron injection on 06/23/18)  Narrative:  The patient returns today for routine follow-up.  He is complaining of increased urinary frequency and urinary hesitation symptoms. He filled out a questionnaire regarding urinary function today providing and overall IPSS score of 7 characterizing his symptoms as mild with weak stream, incomplete bladder emptying and nocturia x3/night. He has occasional burning at the start of his stream but otherwise denies dysuria, gross hematuria, fever or chills.   His pre-implant score was 4. He denies any incontinence, abdominal pain or bowel symptoms. He has tolerated his ADT well and denies significant hot flashes or fatigue.  ALLERGIES:  has No Known Allergies.  Meds: Current Outpatient Medications  Medication Sig Dispense Refill  . amLODipine (NORVASC) 10 MG tablet TAKE 1 TABLET(10 MG) BY MOUTH DAILY 90 tablet 1  . diclofenac sodium (VOLTAREN) 1 % GEL Apply 2 g topically 4 (four) times daily. 1 Tube 0  . hydrochlorothiazide (HYDRODIURIL) 25 MG tablet TAKE 1 TABLET(25 MG) BY MOUTH DAILY 90 tablet 1  . HYDROcodone-acetaminophen (NORCO) 10-325 MG tablet Take 1 tablet by mouth every 8 (eight) hours as needed for severe pain. 90 tablet 0  . HYDROcodone-acetaminophen (NORCO) 10-325 MG tablet Take 1-2 tablets by mouth every 4 (four) hours as  needed for moderate pain. Maximum dose per 24 hours - 8 pills 10 tablet 0  . lisinopril (ZESTRIL) 20 MG tablet TAKE 1 TABLET BY MOUTH DAILY 90 tablet 1  . naproxen (EC NAPROSYN) 500 MG EC tablet Take 1 tablet (500 mg total) by mouth 2 (two) times daily with a meal. 20 tablet 0   No current facility-administered medications for this visit.     Physical Findings: In general this is a well appearing African American male in no acute distress. He's alert and oriented x4 and appropriate throughout the examination. Cardiopulmonary assessment is negative for acute distress and he exhibits normal effort.   Lab Findings: Lab Results  Component Value Date   WBC 4.1 10/25/2018   HGB 11.5 (L) 10/25/2018   HCT 36.8 (L) 10/25/2018   MCV 92.9 10/25/2018   PLT 206 10/25/2018    Radiographic Findings:  Patient underwent CT imaging in our clinic for post implant dosimetry. The CT will be reviewed by Dr. Tammi Klippel to confirm there is an adequate distribution of radioactive seeds throughout the prostate gland and ensure that there are no seeds in or near the rectum. His scheduled for prostate MRI at 10am this morning and those images will be fused with his CT images for further evaluation. We suspect the final radiation plan and dosimetry will show appropriate coverage of the prostate gland. He understands that we will call and inform him of any unexpected findings on further review of his imaging and dosimetry.  Impression/Plan: 73 y.o. gentleman with Stage T1c adenocarcinoma of the prostate  with Gleason score of 3+4, and PSA of 10.8 The patient is recovering from the effects of radiation. His urinary symptoms should gradually improve over the next 4-6 months. We talked about this today. He is encouraged by his improvement already and is otherwise pleased with his outcome. I have sent a Rx for Rapaflo 8mg  po qd to his pharmacy to help manage his LUTS.  We also talked about long-term follow-up for prostate cancer  following seed implant. He understands that ongoing PSA determinations and digital rectal exams will help perform surveillance to rule out disease recurrence. He has a follow up appointment scheduled with Dr. Karsten Ro later today. He understands what to expect with his PSA measures. Patient was also educated today about some of the long-term effects from radiation including a small risk for rectal bleeding and possibly erectile dysfunction. We talked about some of the general management approaches to these potential complications. However, I did encourage the patient to contact our office or return at any point if he has questions or concerns related to his previous radiation and prostate cancer.    Nicholos Johns, PA-C

## 2018-11-18 ENCOUNTER — Other Ambulatory Visit: Payer: Self-pay

## 2018-11-18 DIAGNOSIS — R3915 Urgency of urination: Secondary | ICD-10-CM | POA: Diagnosis not present

## 2018-11-18 DIAGNOSIS — R350 Frequency of micturition: Secondary | ICD-10-CM | POA: Diagnosis not present

## 2018-11-18 NOTE — Telephone Encounter (Signed)
HYDROcodone-acetaminophen (NORCO) 10-325 MG tablet, refill request @  [id number] - [place], [place] - [address] AT [address] [phone] (Phone) [phone] (Fax)    

## 2018-11-18 NOTE — Telephone Encounter (Signed)
Has been filled and waiting on pt

## 2018-11-19 NOTE — Progress Notes (Signed)
  Radiation Oncology         [phone] ________________________________  Name: Jeremy Hill MRN: [id number]  Date: 11/17/2018  DOB: 11/16/1945  COMPLEX SIMULATION NOTE  NARRATIVE:  The patient was brought to the [facility] today following prostate seed implantation approximately one month ago.  Identity was confirmed.  All relevant records and images related to the planned course of therapy were reviewed.  Then, the patient was set-up supine.  CT images were obtained.  The CT images were loaded into the planning software.  Then the prostate and rectum were contoured.  Treatment planning then occurred.  The implanted iodine 125 seeds were identified by the physics staff for projection of radiation distribution  I have requested : 3D Simulation  I have requested a DVH of the following structures: Prostate and rectum.    ________________________________  Sheral Apley Tammi Klippel, M.D.

## 2018-11-22 ENCOUNTER — Encounter: Payer: Self-pay | Admitting: Radiation Oncology

## 2018-11-22 DIAGNOSIS — C61 Malignant neoplasm of prostate: Secondary | ICD-10-CM | POA: Diagnosis not present

## 2018-11-22 DIAGNOSIS — I10 Essential (primary) hypertension: Secondary | ICD-10-CM | POA: Diagnosis not present

## 2018-11-22 DIAGNOSIS — Z803 Family history of malignant neoplasm of breast: Secondary | ICD-10-CM | POA: Diagnosis not present

## 2018-11-22 DIAGNOSIS — Z79899 Other long term (current) drug therapy: Secondary | ICD-10-CM | POA: Diagnosis not present

## 2018-11-22 DIAGNOSIS — M4624 Osteomyelitis of vertebra, thoracic region: Secondary | ICD-10-CM | POA: Diagnosis not present

## 2018-11-22 DIAGNOSIS — D709 Neutropenia, unspecified: Secondary | ICD-10-CM | POA: Diagnosis not present

## 2018-11-27 NOTE — Progress Notes (Signed)
  Radiation Oncology         [phone] ________________________________  Name: Jeremy Hill MRN: [id number]  Date: 11/22/2018  DOB: [birth year]  3D Planning Note   Prostate Brachytherapy Post-Implant Dosimetry  Diagnosis: 73 y.o. gentleman with Stage T1c adenocarcinoma of the prostate with Gleason score of 3+4, and PSA of 10.8  Narrative: On a previous date, Jeremy Hill returned following prostate seed implantation for post implant planning. He underwent CT scan complex simulation to delineate the three-dimensional structures of the pelvis and demonstrate the radiation distribution.  Since that time, the seed localization, and complex isodose planning with dose volume histograms have now been completed.  Results:   Prostate Coverage - The dose of radiation delivered to the 90% or more of the prostate gland (D90) was 110.82% of the prescription dose. This exceeds our goal of greater than 90%. Rectal Sparing - The volume of rectal tissue receiving the prescription dose or higher was 0.06 cc. This falls under our thresholds tolerance of 1.0 cc.  Impression: The prostate seed implant appears to show adequate target coverage and appropriate rectal sparing.  Plan:  The patient will continue to follow with urology for ongoing PSA determinations. I would anticipate a high likelihood for local tumor control with minimal risk for rectal morbidity.  ________________________________  Sheral Apley Tammi Klippel, M.D.

## 2018-12-02 ENCOUNTER — Encounter: Payer: Self-pay | Admitting: Internal Medicine

## 2018-12-02 ENCOUNTER — Other Ambulatory Visit: Payer: Self-pay

## 2018-12-02 ENCOUNTER — Ambulatory Visit (INDEPENDENT_AMBULATORY_CARE_PROVIDER_SITE_OTHER): Payer: Medicare Other | Admitting: Internal Medicine

## 2018-12-02 VITALS — BP 103/76 | HR 78 | Temp 98.1°F | Ht 73.0 in | Wt 222.2 lb

## 2018-12-02 DIAGNOSIS — M4800 Spinal stenosis, site unspecified: Secondary | ICD-10-CM

## 2018-12-02 DIAGNOSIS — C61 Malignant neoplasm of prostate: Secondary | ICD-10-CM | POA: Diagnosis not present

## 2018-12-02 DIAGNOSIS — R350 Frequency of micturition: Secondary | ICD-10-CM

## 2018-12-02 DIAGNOSIS — Z23 Encounter for immunization: Secondary | ICD-10-CM

## 2018-12-02 DIAGNOSIS — Z79891 Long term (current) use of opiate analgesic: Secondary | ICD-10-CM

## 2018-12-02 DIAGNOSIS — H348122 Central retinal vein occlusion, left eye, stable: Secondary | ICD-10-CM

## 2018-12-02 DIAGNOSIS — I10 Essential (primary) hypertension: Secondary | ICD-10-CM

## 2018-12-02 DIAGNOSIS — E785 Hyperlipidemia, unspecified: Secondary | ICD-10-CM

## 2018-12-02 DIAGNOSIS — E7800 Pure hypercholesterolemia, unspecified: Secondary | ICD-10-CM

## 2018-12-02 DIAGNOSIS — Z79899 Other long term (current) drug therapy: Secondary | ICD-10-CM

## 2018-12-02 DIAGNOSIS — G8929 Other chronic pain: Secondary | ICD-10-CM

## 2018-12-02 DIAGNOSIS — M48061 Spinal stenosis, lumbar region without neurogenic claudication: Secondary | ICD-10-CM

## 2018-12-02 DIAGNOSIS — M549 Dorsalgia, unspecified: Secondary | ICD-10-CM

## 2018-12-02 NOTE — Assessment & Plan Note (Signed)
He has chronic back pain related to this issue.  He notes that his pain is stable, he is able to walk easily without assistive device today.  He does not have any change in urinary or bowel habits.   Plan Continue current hydrocodone therapy which helps him maintain his current level of function.

## 2018-12-02 NOTE — Assessment & Plan Note (Signed)
He is following with urology.  Had seeds placed and is doing well after the procedure.  He takes Silodosin to help him continue to urinate.   Plan Continue follow up with Urology

## 2018-12-02 NOTE — Patient Instructions (Signed)
Mr. [place] - -  You are doing well!  Your blood pressure is well controlled.   I will call you with the results of your blood work.  At that time, I may re-prescribe you a medication for cholesterol.   Please follow up with your Urologist as planned.

## 2018-12-02 NOTE — Assessment & Plan Note (Signed)
He has difficulty adjusting to changes in light. Due to see his eye doctor, which he will schedule.

## 2018-12-02 NOTE — Assessment & Plan Note (Signed)
At some point, he stopped taking atorvastatin.   Plan Check lipid panel (LDL previously 120) Restart statin if indicated.

## 2018-12-02 NOTE — Assessment & Plan Note (Signed)
BP is well controlled today at 103/76.  He is taking amlodipine, hctz, lisinopril without issue.   Plan Continue 3 drug regimen Check renal function/potassium today.

## 2018-12-02 NOTE — Progress Notes (Signed)
   Subjective:    Patient ID: Jeremy Hill, male    DOB: [birth year], 73 y.o.   MRN: [id number]  CC:  6 month follow up for HTN  HPI  Jeremy Hill is a 73 year old man with PMH of HTn, spinal stenosis, prostate cancer, HLD  Jeremy Hill reports that he is doing well. He had some urinary issues after his prostate procedure, but this is improved.  He has chronic back pain for which he takes hydrocodone.  This is stable.  The pain medication allows him to do his normal activity.  He is interested in the flu shot.  He no longer is taking the atorvastatin, and he is not clear on why. It may have just expired.    Review of Systems  Constitutional: Negative for activity change, appetite change and unexpected weight change.  Respiratory: Negative for cough and shortness of breath.   Cardiovascular: Negative for chest pain, palpitations and leg swelling.  Gastrointestinal: Negative for abdominal pain and constipation.  Genitourinary: Positive for frequency. Negative for difficulty urinating, enuresis, hematuria and urgency.  Musculoskeletal: Positive for arthralgias and back pain. Negative for gait problem.  Neurological: Negative for dizziness and weakness.  Psychiatric/Behavioral: Negative for decreased concentration and dysphoric mood.       Objective:   Physical Exam Vitals signs and nursing note reviewed.  Constitutional:      General: He is not in acute distress.    Appearance: Normal appearance. He is not toxic-appearing.  HENT:     Head: Normocephalic and atraumatic.  Cardiovascular:     Rate and Rhythm: Normal rate and regular rhythm.     Pulses: Normal pulses.     Heart sounds: No murmur.  Pulmonary:     Effort: Pulmonary effort is normal. No respiratory distress.     Breath sounds: No wheezing.  Abdominal:     General: Abdomen is flat. Bowel sounds are normal. There is no distension.     Palpations: Abdomen is soft.  Musculoskeletal:        General: No swelling or tenderness.   Skin:    General: Skin is warm and dry.  Neurological:     Mental Status: He is alert.  Psychiatric:        Mood and Affect: Mood normal.        Behavior: Behavior normal.     Lipid panel, BMET, CBC today.       Assessment & Plan:  Return in 4-6 months, sooner if needed.  Follow up as planned with Urology.

## 2018-12-03 LAB — CBC
Hematocrit: 39 % (ref 37.5–51.0)
Hemoglobin: 12.4 g/dL — ABNORMAL LOW (ref 13.0–17.7)
MCH: 28.8 pg (ref 26.6–33.0)
MCHC: 31.8 g/dL (ref 31.5–35.7)
MCV: 91 fL (ref 79–97)
Platelets: 200 10*3/uL (ref 150–450)
RBC: 4.31 x10E6/uL (ref 4.14–5.80)
RDW: 11.9 % (ref 11.6–15.4)
WBC: 2.7 10*3/uL — ABNORMAL LOW (ref 3.4–10.8)

## 2018-12-03 LAB — BMP8+ANION GAP
Anion Gap: 16 mmol/L (ref 10.0–18.0)
BUN/Creatinine Ratio: 21 (ref 10–24)
BUN: 24 mg/dL (ref 8–27)
CO2: 24 mmol/L (ref 20–29)
Calcium: 9.6 mg/dL (ref 8.6–10.2)
Chloride: 102 mmol/L (ref 96–106)
Creatinine, Ser: 1.12 mg/dL (ref 0.76–1.27)
GFR calc Af Amer: 77 mL/min/{1.73_m2} (ref >59)
GFR calc non Af Amer: 66 mL/min/{1.73_m2} (ref >59)
Glucose: 107 mg/dL — ABNORMAL HIGH (ref 65–99)
Potassium: 3.9 mmol/L (ref 3.5–5.2)
Sodium: 142 mmol/L (ref 134–144)

## 2018-12-03 LAB — LIPID PANEL
Chol/HDL Ratio: 3.6 ratio (ref 0.0–5.0)
Cholesterol, Total: 162 mg/dL (ref 100–199)
HDL: 45 mg/dL (ref >39)
LDL Chol Calc (NIH): 105 mg/dL — ABNORMAL HIGH (ref 0–99)
Triglycerides: 60 mg/dL (ref 0–149)
VLDL Cholesterol Cal: 12 mg/dL (ref 5–40)

## 2018-12-16 ENCOUNTER — Other Ambulatory Visit: Payer: Self-pay | Admitting: Internal Medicine

## 2018-12-16 DIAGNOSIS — M4800 Spinal stenosis, site unspecified: Secondary | ICD-10-CM

## 2018-12-16 MED ORDER — HYDROCODONE-ACETAMINOPHEN 10-325 MG PO TABS: 1.0000 | ORAL_TABLET | Freq: Three times a day (TID) | ORAL | 0 refills | Status: DC | PRN

## 2018-12-16 NOTE — Telephone Encounter (Signed)
Needs refill on HYDROcodone-acetaminophen (NORCO) 10-325 MG tablet ;pt contact [phone]   [id number] - [place], [place] - [address] 

## 2018-12-21 ENCOUNTER — Telehealth: Payer: Self-pay | Admitting: *Deleted

## 2018-12-21 NOTE — Telephone Encounter (Signed)
Called pt - no answer; left message to call the office . 

## 2018-12-21 NOTE — Telephone Encounter (Signed)
-----   Message from Sid Falcon, MD sent at 12/08/2018  4:23 PM EDT ----- Called and left VM on phone to call back next week.  Would like to discuss lower WBC with him and get a repeat CBC with differential to confirm.

## 2018-12-22 ENCOUNTER — Telehealth: Payer: Self-pay

## 2018-12-22 NOTE — Telephone Encounter (Signed)
Can we call and see if he spoke with Urology?  Thanks!

## 2018-12-22 NOTE — Telephone Encounter (Signed)
rtc to pt, lm for rtc 

## 2018-12-22 NOTE — Telephone Encounter (Signed)
Pt calls and states that he cannot void, states blood is coming out though- without attempting to void, also states he is having swelling of the groin area. He is advised to call dr ottelin's office and if he does not talk to someone within a 74min window he is to go to ED for assess. Sending to dr's mullen, ottelin

## 2018-12-22 NOTE — Telephone Encounter (Signed)
Requesting to speak with a nurse about personal problem. Please call pt back.

## 2018-12-23 NOTE — Telephone Encounter (Signed)
Called pt - no answer; left message to call the office . 

## 2018-12-29 ENCOUNTER — Telehealth: Payer: Self-pay | Admitting: *Deleted

## 2018-12-29 DIAGNOSIS — D709 Neutropenia, unspecified: Secondary | ICD-10-CM

## 2018-12-29 MED ORDER — CHLORPHENIRAMINE-DM 2-15 MG/5ML PO LIQD: 5.0000 mL | ORAL | 0 refills | Status: DC

## 2018-12-29 NOTE — Telephone Encounter (Signed)
Pt returning call per Dr Daryll Drown (on10/21).  Talked to Dr Daryll Drown - wants pt to come in for labs and ask about seeing the urologist; stated he will come tomorrow 10/30 @ 1000 AM. Stated he has an appt coming up (he's not at home to give me date) with Dr Karsten Ro. Also stated he has a bad cold; he has tried OTC meds; requesting something to be sent to [facility]. Thanks

## 2018-12-29 NOTE — Telephone Encounter (Signed)
I will place an order for a future CBC with differential.   Would recommend clorphirimine products for cough/cold.  Will send name to the pharmacy, but I believe they are over the counter.   Please ensure he wears a mask when coming to the clinic.   Gilles Chiquito, MD

## 2018-12-29 NOTE — Telephone Encounter (Signed)
Called patient and LVM to call the clinic back to see how he is doing and if he was able to get in to see his Urologist.  I do not see an ED visit in our system.

## 2018-12-30 ENCOUNTER — Other Ambulatory Visit: Payer: Self-pay

## 2018-12-30 ENCOUNTER — Other Ambulatory Visit (INDEPENDENT_AMBULATORY_CARE_PROVIDER_SITE_OTHER): Payer: Medicare Other

## 2018-12-30 DIAGNOSIS — D709 Neutropenia, unspecified: Secondary | ICD-10-CM | POA: Diagnosis not present

## 2018-12-31 LAB — CBC WITH DIFFERENTIAL/PLATELET
Basophils Absolute: 0 10*3/uL (ref 0.0–0.2)
Basos: 1 %
EOS (ABSOLUTE): 0.3 10*3/uL (ref 0.0–0.4)
Eos: 11 %
Hematocrit: 37.5 % (ref 37.5–51.0)
Hemoglobin: 12.1 g/dL — ABNORMAL LOW (ref 13.0–17.7)
Immature Grans (Abs): 0 10*3/uL (ref 0.0–0.1)
Immature Granulocytes: 0 %
Lymphocytes Absolute: 1 10*3/uL (ref 0.7–3.1)
Lymphs: 33 %
MCH: 28.9 pg (ref 26.6–33.0)
MCHC: 32.3 g/dL (ref 31.5–35.7)
MCV: 90 fL (ref 79–97)
Monocytes Absolute: 0.5 10*3/uL (ref 0.1–0.9)
Monocytes: 15 %
Neutrophils Absolute: 1.2 10*3/uL — ABNORMAL LOW (ref 1.4–7.0)
Neutrophils: 40 %
Platelets: 194 10*3/uL (ref 150–450)
RBC: 4.18 x10E6/uL (ref 4.14–5.80)
RDW: 12.1 % (ref 11.6–15.4)
WBC: 3 10*3/uL — ABNORMAL LOW (ref 3.4–10.8)

## 2019-01-02 ENCOUNTER — Telehealth: Payer: Self-pay | Admitting: Internal Medicine

## 2019-01-02 NOTE — Telephone Encounter (Signed)
Pt is requesting a nurse to callback [phone]

## 2019-01-02 NOTE — Telephone Encounter (Signed)
Returned call to patient. No answer. Left message on VM requesting return call. L. Tiwana Chavis, RN, BSN     

## 2019-01-16 ENCOUNTER — Other Ambulatory Visit: Payer: Self-pay | Admitting: Internal Medicine

## 2019-01-16 DIAGNOSIS — M4800 Spinal stenosis, site unspecified: Secondary | ICD-10-CM

## 2019-01-16 NOTE — Telephone Encounter (Signed)
Needs refill on HYDROcodone-acetaminophen (NORCO) 10-325 MG tablet ;pt contact [phone]   [id number] - [place], [place] - [address] 

## 2019-01-17 MED ORDER — HYDROCODONE-ACETAMINOPHEN 10-325 MG PO TABS: 1.0000 | ORAL_TABLET | Freq: Three times a day (TID) | ORAL | 0 refills | Status: DC | PRN

## 2019-01-24 ENCOUNTER — Encounter: Payer: Self-pay | Admitting: Internal Medicine

## 2019-01-24 ENCOUNTER — Telehealth: Payer: Self-pay | Admitting: Internal Medicine

## 2019-01-24 DIAGNOSIS — D709 Neutropenia, unspecified: Secondary | ICD-10-CM

## 2019-01-24 NOTE — Telephone Encounter (Signed)
Attempted to call patient with results of CBC.  No answer.   His ANC is 1200 which would place him at mild neutropenia.   He will need a repeat to confirm in 4-6 weeks and then if persistent, hematology evaluation outpatient.    This may be related to his prostate cancer, but will further evaluate as above.   Will place order for CBC with differential.    Gilles Chiquito, MD

## 2019-01-25 ENCOUNTER — Telehealth: Payer: Self-pay | Admitting: *Deleted

## 2019-01-25 NOTE — Telephone Encounter (Signed)
Called pt - no answer; left message to call the office . 

## 2019-01-25 NOTE — Telephone Encounter (Signed)
Call from pt - informed Dr Daryll Drown had called him about test results but no answer. Informed Dr Daryll Drown has mailed him a letter and will like to repeat labs in mid December. Lab appt scheduled 12/15 @ 0900 am.

## 2019-01-25 NOTE — Telephone Encounter (Signed)
-----   Message from Sid Falcon, MD sent at 01/24/2019  9:30 AM EST ----- Jeremy Hill - -  Jeremy Hill needs repeat CBC in mid December.  I have tried to call him without success.  I know he doesn't always answer his phone during the day.  Can you please try to get in touch with him and let him know I would like to repeat his blood work?  I will place a future order for CBC with differential.   Thanks! ----- Message ----- From: Interface, [facility] Lab Results In Sent: 12/31/2018   5:40 AM EST To: Sid Falcon, MD

## 2019-02-11 ENCOUNTER — Other Ambulatory Visit: Payer: Self-pay | Admitting: Internal Medicine

## 2019-02-11 DIAGNOSIS — I10 Essential (primary) hypertension: Secondary | ICD-10-CM

## 2019-02-14 ENCOUNTER — Other Ambulatory Visit: Payer: Medicare Other

## 2019-02-14 ENCOUNTER — Other Ambulatory Visit: Payer: Self-pay | Admitting: Internal Medicine

## 2019-02-14 DIAGNOSIS — M4800 Spinal stenosis, site unspecified: Secondary | ICD-10-CM

## 2019-02-14 MED ORDER — HYDROCODONE-ACETAMINOPHEN 10-325 MG PO TABS: 1.0000 | ORAL_TABLET | Freq: Three times a day (TID) | ORAL | 0 refills | Status: DC | PRN

## 2019-02-14 NOTE — Telephone Encounter (Signed)
Last rx written 01/17/19. Last OV  12/02/18. Next OV - has not been scheduled. UDS  03/30/18.

## 2019-02-14 NOTE — Telephone Encounter (Signed)
Needs refill on HYDROcodone-acetaminophen (NORCO) 10-325 MG tablet ;pt contact [place], [place]

## 2019-02-20 DIAGNOSIS — R3911 Hesitancy of micturition: Secondary | ICD-10-CM | POA: Diagnosis not present

## 2019-03-15 ENCOUNTER — Other Ambulatory Visit: Payer: Self-pay | Admitting: Internal Medicine

## 2019-03-15 DIAGNOSIS — M4800 Spinal stenosis, site unspecified: Secondary | ICD-10-CM

## 2019-03-15 MED ORDER — HYDROCODONE-ACETAMINOPHEN 10-325 MG PO TABS: 1.0000 | ORAL_TABLET | Freq: Three times a day (TID) | ORAL | 0 refills | Status: DC | PRN

## 2019-03-15 NOTE — Telephone Encounter (Signed)
Need refill on HYDROcodone-acetaminophen (NORCO) 10-325 MG tablet ;pt contact [phone]  ° °[id number] - [place], [place] - [address] °

## 2019-03-28 ENCOUNTER — Other Ambulatory Visit: Payer: Self-pay | Admitting: Internal Medicine

## 2019-03-28 NOTE — Telephone Encounter (Signed)
Hydrocodone was refill 03/15/19. Called pt - no answer, left message to call the office.

## 2019-03-28 NOTE — Telephone Encounter (Signed)
Pt called back and stated he had a message, informed him pain med was at pharm, ask him to call the pharm, if problems call back, he is agreeable

## 2019-03-28 NOTE — Telephone Encounter (Signed)
Need refill on HYDROcodone-acetaminophen (NORCO) 10-325 MG tablet ;pt contact [phone]  ° °[id number] - [place],  - [address] °

## 2019-03-28 NOTE — Telephone Encounter (Signed)
Pt calling back about pain med refill. I called [facility] talked to Abby- stated pt picked up rx on the 13th. I called pt back - informed of the above; stated he might be confused of which med he needs. But he stated it will be ready on 2/13-informed pt to call a few days ahead to request refill.

## 2019-04-11 ENCOUNTER — Other Ambulatory Visit: Payer: Self-pay | Admitting: Internal Medicine

## 2019-04-11 DIAGNOSIS — M4800 Spinal stenosis, site unspecified: Secondary | ICD-10-CM

## 2019-04-11 MED ORDER — HYDROCODONE-ACETAMINOPHEN 10-325 MG PO TABS: 1.0000 | ORAL_TABLET | Freq: Three times a day (TID) | ORAL | 0 refills | Status: DC | PRN

## 2019-04-11 NOTE — Telephone Encounter (Signed)
Needs refill on HYDROcodone-acetaminophen (NORCO) 10-325 MG tablet ;pt contact [place], [place]

## 2019-05-08 ENCOUNTER — Other Ambulatory Visit: Payer: Self-pay | Admitting: Internal Medicine

## 2019-05-08 DIAGNOSIS — M4800 Spinal stenosis, site unspecified: Secondary | ICD-10-CM

## 2019-05-08 MED ORDER — HYDROCODONE-ACETAMINOPHEN 10-325 MG PO TABS: 1.0000 | ORAL_TABLET | Freq: Three times a day (TID) | ORAL | 0 refills | Status: DC | PRN

## 2019-05-08 NOTE — Telephone Encounter (Signed)
Needs refill on HYDROcodone-acetaminophen (NORCO) 10-325 MG tablet  ;pt contact [place], [place]

## 2019-05-30 ENCOUNTER — Other Ambulatory Visit: Payer: Self-pay

## 2019-05-30 DIAGNOSIS — M4800 Spinal stenosis, site unspecified: Secondary | ICD-10-CM

## 2019-05-30 MED ORDER — HYDROCODONE-ACETAMINOPHEN 10-325 MG PO TABS: 1.0000 | ORAL_TABLET | Freq: Three times a day (TID) | ORAL | 0 refills | Status: DC | PRN

## 2019-05-30 NOTE — Telephone Encounter (Signed)
Last rx written 05/08/19. Last OV 12/02/19. Next OV has not been scheduled. UDS 03/30/18.

## 2019-05-30 NOTE — Telephone Encounter (Signed)
Last documentation 12/2018 Last UDS 03/2018 and inappropriate. Will fill 30 days only and send to PCP Pls sch PCP appt 1st available, overdue appt pain

## 2019-05-30 NOTE — Telephone Encounter (Signed)
HYDROcodone-acetaminophen (NORCO) 10-325 MG tablet, refill request @  [facility] [place], [place] AT [place] [phone] (Phone) [phone] (Fax)

## 2019-06-05 ENCOUNTER — Ambulatory Visit (INDEPENDENT_AMBULATORY_CARE_PROVIDER_SITE_OTHER): Payer: Medicare Other | Admitting: Internal Medicine

## 2019-06-05 ENCOUNTER — Encounter: Payer: Self-pay | Admitting: Internal Medicine

## 2019-06-05 ENCOUNTER — Other Ambulatory Visit: Payer: Self-pay

## 2019-06-05 VITALS — BP 111/75 | HR 69 | Temp 98.1°F | Ht 73.0 in | Wt 221.9 lb

## 2019-06-05 DIAGNOSIS — M48061 Spinal stenosis, lumbar region without neurogenic claudication: Secondary | ICD-10-CM

## 2019-06-05 DIAGNOSIS — M4800 Spinal stenosis, site unspecified: Secondary | ICD-10-CM

## 2019-06-05 DIAGNOSIS — G8929 Other chronic pain: Secondary | ICD-10-CM

## 2019-06-05 DIAGNOSIS — Z79899 Other long term (current) drug therapy: Secondary | ICD-10-CM

## 2019-06-05 DIAGNOSIS — D709 Neutropenia, unspecified: Secondary | ICD-10-CM | POA: Diagnosis not present

## 2019-06-05 DIAGNOSIS — I10 Essential (primary) hypertension: Secondary | ICD-10-CM

## 2019-06-05 DIAGNOSIS — C61 Malignant neoplasm of prostate: Secondary | ICD-10-CM | POA: Diagnosis not present

## 2019-06-05 DIAGNOSIS — Z79891 Long term (current) use of opiate analgesic: Secondary | ICD-10-CM

## 2019-06-05 NOTE — Assessment & Plan Note (Signed)
Blood pressure very well controlled on three drug regimen. No medication side effects. Will check renal function and electrolytes today. Continue current therapy.

## 2019-06-05 NOTE — Progress Notes (Signed)
Internal Medicine Clinic Attending  Case discussed with Dr. Bloomfield at the time of the visit.  We reviewed the resident's history and exam and pertinent patient test results.  I agree with the assessment, diagnosis, and plan of care documented in the resident's note.  

## 2019-06-05 NOTE — Patient Instructions (Signed)
Mr. Letizia, It was a pleasure meeting you today! I really enjoyed our visit.  You are doing very well. I hope your upcoming urology appointment goes well!  Blood pressure looks great today, so we do not need to make any changes.   We will check some labs today, and I will let you know if anything is abnormal.   Take care, Dr. Koleen Distance

## 2019-06-05 NOTE — Assessment & Plan Note (Signed)
He has chronic back pain from spinal stenosis. Takes hydrocodone to help him maintain current level of function. He denies any issues with medication side effects. Still goes to the [facility] to walk and do weight training. No recent exacerbations of pain.  Last utox did not show detectable levels of Hydrocodone. Will obtain urine sample today to update annual screening. Last dose of medication was last night.

## 2019-06-05 NOTE — Assessment & Plan Note (Signed)
Repeating CBC with diff today to monitor mild neutropenia.

## 2019-06-05 NOTE — Progress Notes (Signed)
Acute Office Visit  Subjective:    Patient ID: Jeremy Hill, male    DOB: June 03, 1945, 74 y.o.   MRN: [id number]  Chief Complaint  Patient presents with  . Follow-up    check up     HPI Patient is in today for follow-up on chronic HTN, spinal stenosis. Please see problem based charting for updates on his chronic medical conditions.   Past Medical History:  Diagnosis Date  . Anemia 2008   borderline low Hg/Hct = 12.6/39.6 (01/10/2007), no anemia panel available and patient had refused colonoscopy, last colonoscopy done was in 2005 and the results were normal, done by Dr. Collene Mares  . Depression   . Hypertension   . Neutropenia 2008   noted on cbc (01/10/2007) WBC = 3.9, also CBC done 08/2006 showed WBC = 3.0, unclear etiology; CXR done 08/2006 showed questionable right lung nodule and the follow up CT was recommended, there is no data in the [facility] that it was done  . Osteomyelitis ([place]) 12/2001   per MRI of the spine - GIVEN THE ABNORMALITY AT THE T8-9 LEVEL, INFECTION AT THESE LATTER REGIONS CANNOT BE COMPLETELY EXCLUDED AND  WILL NEED TO BE FOLLOWED CLOSELY. SCATTERED DEGENERATIVE CHANGES IN THE LOWER  THORACIC/LUMBAR SPINE  AT THE L4-5 SIGNIFICANT NEURAL FORAMINAL NARROWING (R>L).  DECREASED SIGNAL INTENSITY OF BONE MARROW. UNDERLYING ANEMIA/INFILTRATIVE PROCESS/ LYMPHOMA.   . Osteomyelitis, chronic ([place]) 2004   persistant osteomyelitis per MRI 04/2002 and also progressive at the same level as in 2003 T8-9 level  . Prostate cancer [facility])     Past Surgical History:  Procedure Laterality Date  . CYSTOSCOPY N/A 10/28/2018   Procedure: CYSTOSCOPY;  Surgeon: Kathie Rhodes, MD;  Location: [facility];  Service: Urology;  Laterality: N/A;  No seeds seen per Dr. Karsten Ro  . HERNIA REPAIR    . LYMPH NODE BIOPSY  01/2002   right inguinal node biopsy (done secondary to finding of neutropenia and lymphadenopathy) -  REACTIVE LYMPHOID HYPERPLASIA WITH SINUS HISTIOCYTOSIS AND  PLASMACYTOSIS, no evidence of malignancy  . PROSTATE BIOPSY    . RADIOACTIVE SEED IMPLANT N/A 10/28/2018   Procedure: RADIOACTIVE SEED IMPLANT/BRACHYTHERAPY IMPLANT;  Surgeon: Kathie Rhodes, MD;  Location: [facility];  Service: Urology;  Laterality: N/A;  . RHINOPLASTY    . SPACE OAR INSTILLATION N/A 10/28/2018   Procedure: SPACE OAR INSTILLATION;  Surgeon: Kathie Rhodes, MD;  Location: [facility];  Service: Urology;  Laterality: N/A;    Family History  Problem Relation Age of Onset  . Heart disease Mother   . Heart disease Father   . Diabetes Sister   . Breast cancer Sister   . Suicidality Brother   . Prostate cancer Neg Hx   . Colon cancer Neg Hx   . Pancreatic cancer Neg Hx     Social History   Socioeconomic History  . Marital status: Divorced    Spouse name: Not on file  . Number of children: 3  . Years of education: Not on file  . Highest education level: Not on file  Occupational History    Comment: retired  Tobacco Use  . Smoking status: Never Smoker  . Smokeless tobacco: Never Used  Substance and Sexual Activity  . Alcohol use: No    Alcohol/week: 0.0 standard drinks  . Drug use: No  . Sexual activity: Not Currently  Other Topics Concern  . Not on file  Social History Narrative   Recently divorced.  Social Determinants of Health   Financial Resource Strain:   . Difficulty of Paying Living Expenses:   Food Insecurity:   . Worried About Charity fundraiser in the Last Year:   . Arboriculturist in the Last Year:   Transportation Needs:   . Film/video editor (Medical):   Marland Kitchen Lack of Transportation (Non-Medical):   Physical Activity:   . Days of Exercise per Week:   . Minutes of Exercise per Session:   Stress:   . Feeling of Stress :   Social Connections:   . Frequency of Communication with Friends and Family:   . Frequency of Social Gatherings with Friends and Family:   . Attends Religious Services:   . Active  Member of Clubs or Organizations:   . Attends Archivist Meetings:   Marland Kitchen Marital Status:   Intimate Partner Violence:   . Fear of Current or Ex-Partner:   . Emotionally Abused:   Marland Kitchen Physically Abused:   . Sexually Abused:     Outpatient Medications Prior to Visit  Medication Sig Dispense Refill  . amLODipine (NORVASC) 10 MG tablet TAKE 1 TABLET(10 MG) BY MOUTH DAILY 90 tablet 1  . Chlorpheniramine-DM 2-15 MG/5ML LIQD Take 5 mLs by mouth as directed. (OTC) 118 mL 0  . hydrochlorothiazide (HYDRODIURIL) 25 MG tablet TAKE 1 TABLET(25 MG) BY MOUTH DAILY 90 tablet 1  . HYDROcodone-acetaminophen (NORCO) 10-325 MG tablet Take 1 tablet by mouth every 8 (eight) hours as needed for severe pain. 90 tablet 0  . lisinopril (ZESTRIL) 20 MG tablet TAKE 1 TABLET BY MOUTH DAILY 90 tablet 1  . silodosin (RAPAFLO) 8 MG CAPS capsule Take 1 capsule (8 mg total) by mouth daily with breakfast. 30 capsule 2   No facility-administered medications prior to visit.    No Known Allergies  Review of Systems  Constitutional: Negative for activity change, appetite change, fever and unexpected weight change.  Eyes: Positive for discharge.  Respiratory: Negative for cough and shortness of breath.   Cardiovascular: Negative for chest pain and palpitations.  Gastrointestinal: Negative for abdominal pain and constipation.  Genitourinary: Negative for difficulty urinating.  Musculoskeletal: Negative for gait problem and joint swelling.  Skin: Negative for rash.  Neurological: Negative for dizziness, weakness and light-headedness.  Psychiatric/Behavioral: Negative for dysphoric mood and sleep disturbance.       Objective:    Physical Exam Constitutional:      General: He is not in acute distress.    Appearance: Normal appearance.  Eyes:     Comments: Clear discharge from both eyes, related to light sensitivity   Cardiovascular:     Rate and Rhythm: Normal rate and regular rhythm.     Heart sounds:  Normal heart sounds.  Pulmonary:     Effort: Pulmonary effort is normal.     Breath sounds: Normal breath sounds.  Abdominal:     General: Bowel sounds are normal. There is no distension.     Palpations: Abdomen is soft.     Tenderness: There is no abdominal tenderness.  Musculoskeletal:     Cervical back: Neck supple.     Right lower leg: No edema.     Left lower leg: No edema.  Skin:    General: Skin is warm and dry.  Neurological:     Mental Status: He is alert.  Psychiatric:        Mood and Affect: Mood normal.        Behavior: Behavior normal.  BP 111/75 (BP Location: Left Arm, Patient Position: Sitting, Cuff Size: Large)   Pulse 69   Temp 98.1 F (36.7 C) (Oral)   Ht 6\' 1"  (1.854 m)   Wt 221 lb 14.4 oz (100.7 kg)   SpO2 98%   BMI 29.28 kg/m  Wt Readings from Last 3 Encounters:  06/05/19 221 lb 14.4 oz (100.7 kg)  12/02/18 222 lb 3.2 oz (100.8 kg)  11/17/18 219 lb 2 oz (99.4 kg)    Health Maintenance Due  Topic Date Due  . COLON CANCER SCREENING ANNUAL FOBT  12/17/2017    There are no preventive care reminders to display for this patient.   Lab Results  Component Value Date   TSH 1.781 05/04/2007   Lab Results  Component Value Date   WBC 3.0 (L) 12/30/2018   HGB 12.1 (L) 12/30/2018   HCT 37.5 12/30/2018   MCV 90 12/30/2018   PLT 194 12/30/2018   Lab Results  Component Value Date   NA 142 12/02/2018   K 3.9 12/02/2018   CO2 24 12/02/2018   GLUCOSE 107 (H) 12/02/2018   BUN 24 12/02/2018   CREATININE 1.12 12/02/2018   BILITOT 0.9 10/25/2018   ALKPHOS 63 10/25/2018   AST 24 10/25/2018   ALT 20 10/25/2018   PROT 7.7 10/25/2018   ALBUMIN 4.2 10/25/2018   CALCIUM 9.6 12/02/2018   ANIONGAP 7 10/25/2018   Lab Results  Component Value Date   CHOL 162 12/02/2018   Lab Results  Component Value Date   HDL 45 12/02/2018   Lab Results  Component Value Date   LDLCALC 105 (H) 12/02/2018   Lab Results  Component Value Date   TRIG 60  12/02/2018   Lab Results  Component Value Date   CHOLHDL 3.6 12/02/2018   Lab Results  Component Value Date   HGBA1C 5.3 06/09/2016       Assessment & Plan:   Problem List Items Addressed This Visit      Cardiovascular and Mediastinum   Essential hypertension    Blood pressure very well controlled on three drug regimen. No medication side effects. Will check renal function and electrolytes today. Continue current therapy.       Relevant Orders   BMP8+Anion Gap     Genitourinary   Malignant neoplasm of prostate ([place]) - Primary   Relevant Orders   CBC with Diff     Other   Neutropenia ([place])    Repeating CBC with diff today to monitor mild neutropenia.       Spinal stenosis    He has chronic back pain from spinal stenosis. Takes hydrocodone to help him maintain current level of function. He denies any issues with medication side effects. Still goes to the [facility] to walk and do weight training. No recent exacerbations of pain.  Last utox did not show detectable levels of Hydrocodone. Will obtain urine sample today to update annual screening. Last dose of medication was last night.       Relevant Orders   ToxAssure Select,+Antidepr,UR       No orders of the defined types were placed in this encounter.    Delice Bison, DO

## 2019-06-06 LAB — CBC WITH DIFFERENTIAL/PLATELET
Basophils Absolute: 0 10*3/uL (ref 0.0–0.2)
Basos: 1 %
EOS (ABSOLUTE): 0.3 10*3/uL (ref 0.0–0.4)
Eos: 8 %
Hematocrit: 37.6 % (ref 37.5–51.0)
Hemoglobin: 12.4 g/dL — ABNORMAL LOW (ref 13.0–17.7)
Immature Grans (Abs): 0 10*3/uL (ref 0.0–0.1)
Immature Granulocytes: 0 %
Lymphocytes Absolute: 1.2 10*3/uL (ref 0.7–3.1)
Lymphs: 35 %
MCH: 29.1 pg (ref 26.6–33.0)
MCHC: 33 g/dL (ref 31.5–35.7)
MCV: 88 fL (ref 79–97)
Monocytes Absolute: 0.5 10*3/uL (ref 0.1–0.9)
Monocytes: 14 %
Neutrophils Absolute: 1.4 10*3/uL (ref 1.4–7.0)
Neutrophils: 42 %
Platelets: 208 10*3/uL (ref 150–450)
RBC: 4.26 x10E6/uL (ref 4.14–5.80)
RDW: 12.3 % (ref 11.6–15.4)
WBC: 3.4 10*3/uL (ref 3.4–10.8)

## 2019-06-06 LAB — BMP8+ANION GAP
Anion Gap: 13 mmol/L (ref 10.0–18.0)
BUN/Creatinine Ratio: 18 (ref 10–24)
BUN: 17 mg/dL (ref 8–27)
CO2: 25 mmol/L (ref 20–29)
Calcium: 9.1 mg/dL (ref 8.6–10.2)
Chloride: 101 mmol/L (ref 96–106)
Creatinine, Ser: 0.97 mg/dL (ref 0.76–1.27)
GFR calc Af Amer: 90 mL/min/{1.73_m2} (ref >59)
GFR calc non Af Amer: 78 mL/min/{1.73_m2} (ref >59)
Glucose: 104 mg/dL — ABNORMAL HIGH (ref 65–99)
Potassium: 3.9 mmol/L (ref 3.5–5.2)
Sodium: 139 mmol/L (ref 134–144)

## 2019-06-07 LAB — TOXASSURE SELECT,+ANTIDEPR,UR

## 2019-06-12 ENCOUNTER — Telehealth: Payer: Self-pay | Admitting: Internal Medicine

## 2019-06-12 NOTE — Telephone Encounter (Signed)
Pls contact pt regarding results [phone]

## 2019-06-12 NOTE — Telephone Encounter (Signed)
Returned patient's call and gave him lab results from our visit last week. He appreciated the good news that everything was normal.

## 2019-07-06 ENCOUNTER — Other Ambulatory Visit: Payer: Self-pay | Admitting: Internal Medicine

## 2019-07-06 DIAGNOSIS — M4800 Spinal stenosis, site unspecified: Secondary | ICD-10-CM

## 2019-07-06 MED ORDER — HYDROCODONE-ACETAMINOPHEN 10-325 MG PO TABS: 1.0000 | ORAL_TABLET | Freq: Three times a day (TID) | ORAL | 0 refills | Status: DC | PRN

## 2019-07-06 NOTE — Telephone Encounter (Signed)
Needs refill on HYDROcodone-acetaminophen (NORCO) 10-325 MG tablet ;pt contact [place], [place]

## 2019-07-15 IMAGING — CR CHEST - 2 VIEW
2 series · 2 of 2 positions shown · non-contrast
Comparison: Chest x-ray dated August 29, 2006.

CLINICAL DATA: Preoperative study for prostate surgery.

EXAM:
CHEST - 2 VIEW

[w chest pa]
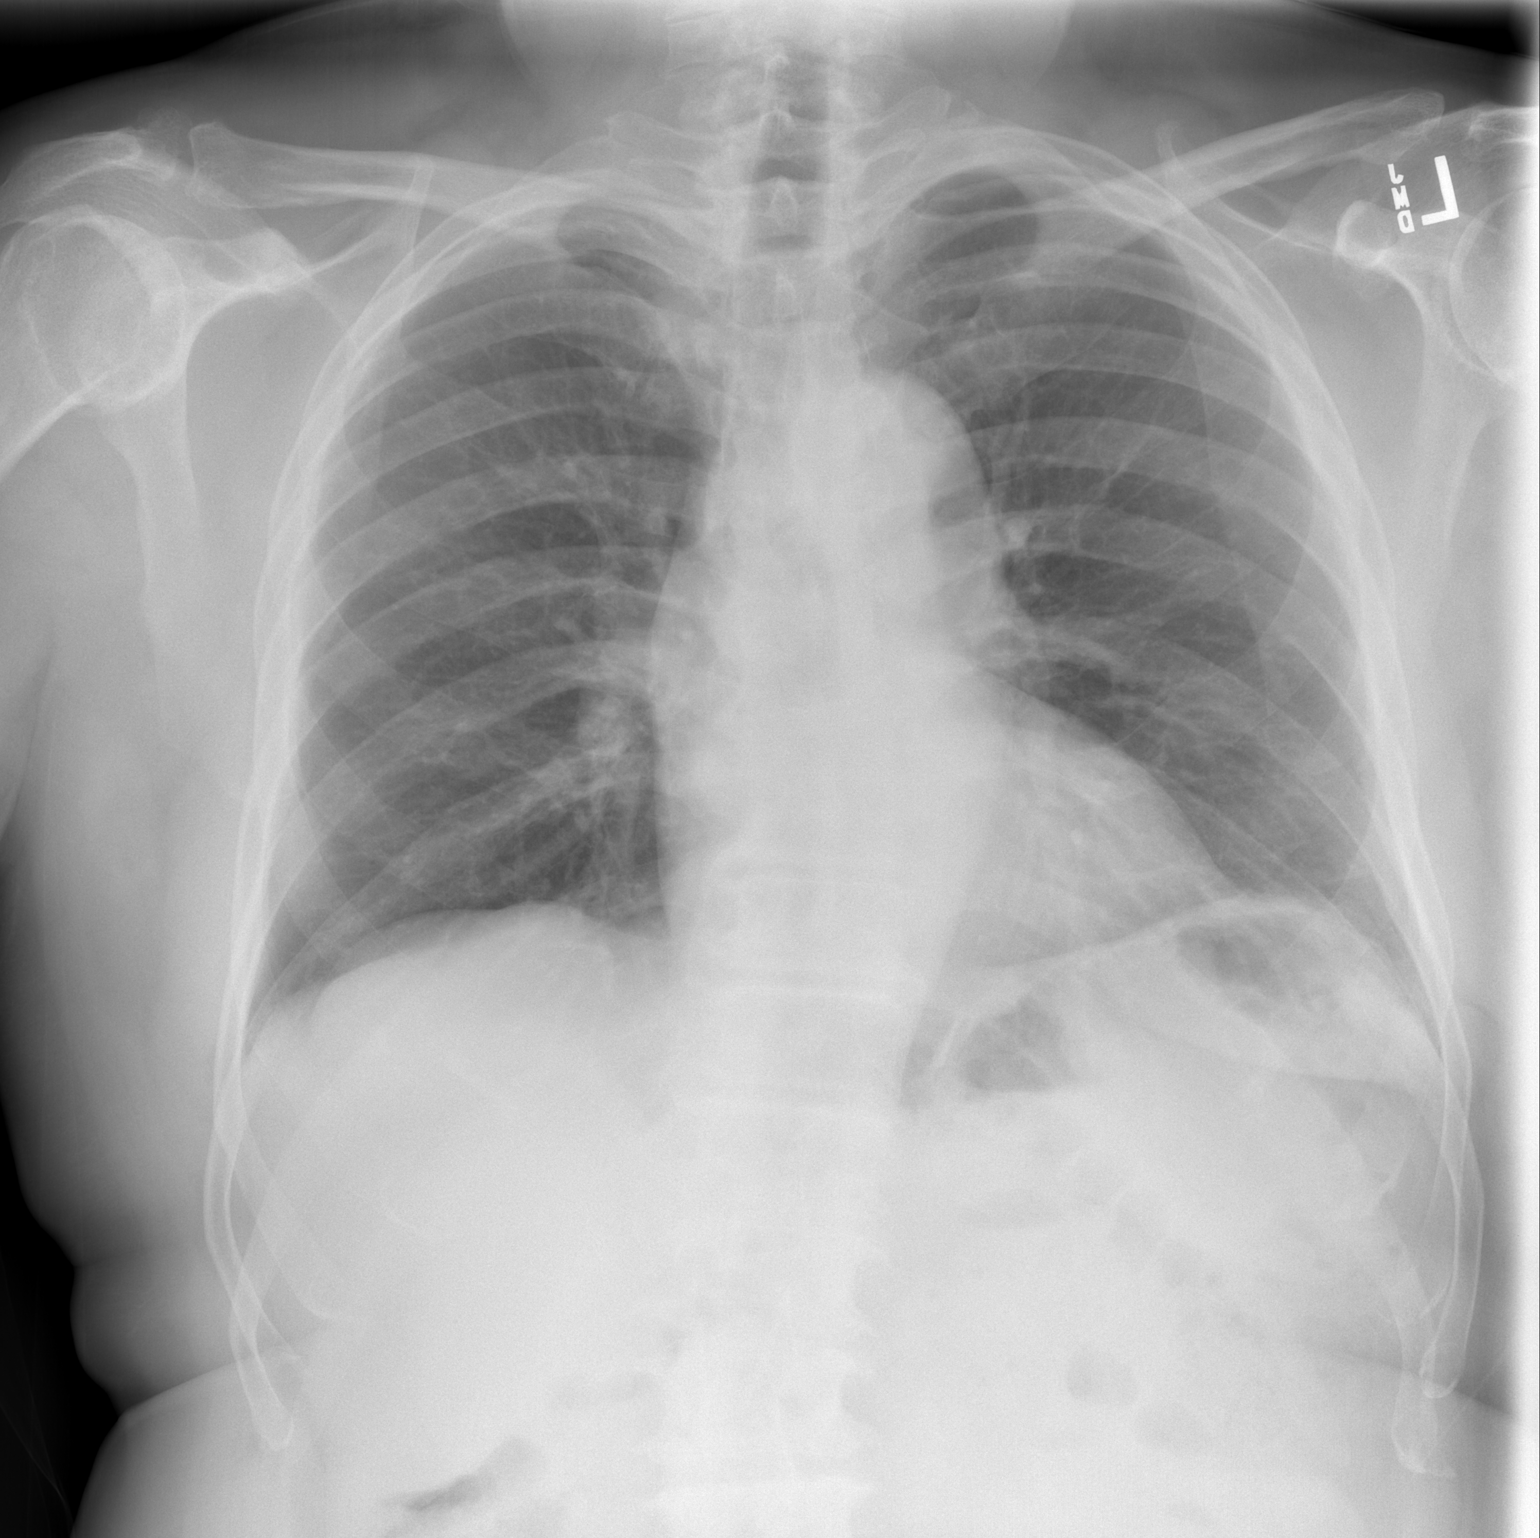

[w chest lat]
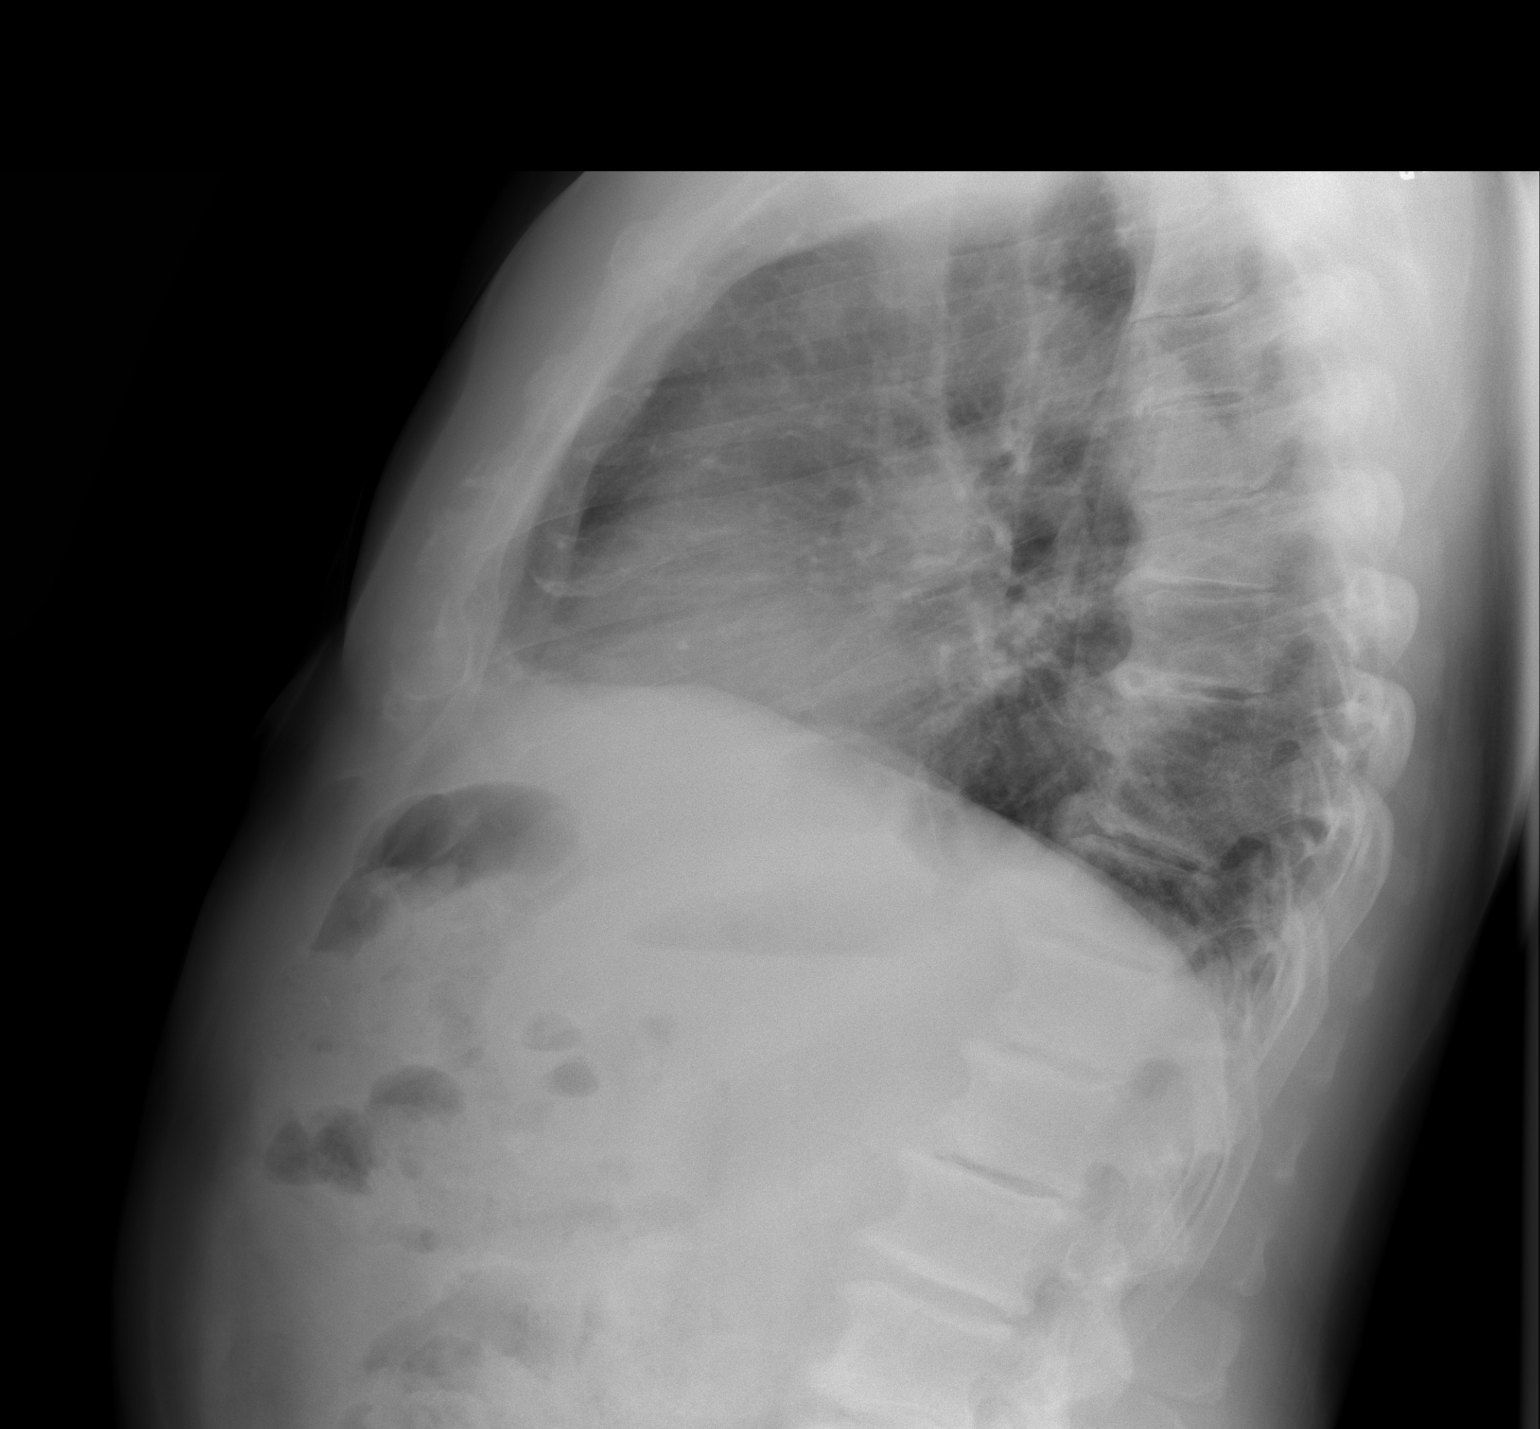

[2 of 2 positions shown; findings below may reference images not displayed]

FINDINGS: The heart size and mediastinal contours are within normal limits.
Normal pulmonary vascularity. No focal consolidation, pleural
effusion, or pneumothorax. No acute osseous abnormality.
IMPRESSION: No active cardiopulmonary disease.

## 2019-08-02 ENCOUNTER — Other Ambulatory Visit: Payer: Self-pay | Admitting: Internal Medicine

## 2019-08-02 DIAGNOSIS — M4800 Spinal stenosis, site unspecified: Secondary | ICD-10-CM

## 2019-08-02 NOTE — Telephone Encounter (Signed)
Needs refill on HYDROcodone-acetaminophen (NORCO) 10-325 MG tablet ;pt contact [place], [place]

## 2019-08-03 MED ORDER — HYDROCODONE-ACETAMINOPHEN 10-325 MG PO TABS: 1.0000 | ORAL_TABLET | Freq: Three times a day (TID) | ORAL | 0 refills | Status: DC | PRN

## 2019-08-10 ENCOUNTER — Other Ambulatory Visit: Payer: Self-pay | Admitting: Internal Medicine

## 2019-08-10 DIAGNOSIS — I10 Essential (primary) hypertension: Secondary | ICD-10-CM

## 2019-09-05 ENCOUNTER — Other Ambulatory Visit: Payer: Self-pay | Admitting: Internal Medicine

## 2019-09-05 DIAGNOSIS — M4800 Spinal stenosis, site unspecified: Secondary | ICD-10-CM

## 2019-09-05 NOTE — Telephone Encounter (Signed)
NEED REFILL ON HYDROcodone-acetaminophen (NORCO) 10-325 MG tablet ;PT CONTACT [place], [place] - [address] AT [place]

## 2019-09-06 MED ORDER — HYDROCODONE-ACETAMINOPHEN 10-325 MG PO TABS: 1.0000 | ORAL_TABLET | Freq: Three times a day (TID) | ORAL | 0 refills | Status: DC | PRN

## 2019-09-06 NOTE — Telephone Encounter (Signed)
Pt is calling back regarding his medicine [phone]

## 2019-09-25 DIAGNOSIS — R350 Frequency of micturition: Secondary | ICD-10-CM | POA: Diagnosis not present

## 2019-09-25 DIAGNOSIS — R3915 Urgency of urination: Secondary | ICD-10-CM | POA: Diagnosis not present

## 2019-10-04 ENCOUNTER — Other Ambulatory Visit: Payer: Self-pay | Admitting: Internal Medicine

## 2019-10-04 DIAGNOSIS — M4800 Spinal stenosis, site unspecified: Secondary | ICD-10-CM

## 2019-10-04 MED ORDER — HYDROCODONE-ACETAMINOPHEN 10-325 MG PO TABS: 1.0000 | ORAL_TABLET | Freq: Three times a day (TID) | ORAL | 0 refills | Status: DC | PRN

## 2019-10-04 NOTE — Telephone Encounter (Signed)
Need refill on HYDROcodone-acetaminophen (NORCO) 10-325 MG tablet ;pt contact [phone]  ° °[id number] - [place], [place] - [address] AT [address] °

## 2019-11-07 ENCOUNTER — Other Ambulatory Visit: Payer: Self-pay | Admitting: Internal Medicine

## 2019-11-07 DIAGNOSIS — M4800 Spinal stenosis, site unspecified: Secondary | ICD-10-CM

## 2019-11-07 MED ORDER — HYDROCODONE-ACETAMINOPHEN 10-325 MG PO TABS: 1.0000 | ORAL_TABLET | Freq: Three times a day (TID) | ORAL | 0 refills | Status: DC | PRN

## 2019-11-07 NOTE — Telephone Encounter (Signed)
Need refill on HYDROcodone-acetaminophen (NORCO) 10-325 MG tablet  ;pt contact [phone]   Pt is completely out   [place], [place]

## 2019-11-07 NOTE — Telephone Encounter (Signed)
Last rx written 10/04/19. Last OV 4/5 with dr bloomfield. Next OV has not been scheduled. UDS 06/05/19.

## 2019-11-07 NOTE — Telephone Encounter (Signed)
3 month refills sent in.   PDMP appropriate.   Gilles Chiquito, MD

## 2019-12-04 ENCOUNTER — Other Ambulatory Visit: Payer: Self-pay | Admitting: Internal Medicine

## 2019-12-04 ENCOUNTER — Telehealth: Payer: Self-pay | Admitting: Internal Medicine

## 2019-12-04 DIAGNOSIS — M4800 Spinal stenosis, site unspecified: Secondary | ICD-10-CM

## 2019-12-04 NOTE — Telephone Encounter (Signed)
Need refill on HYDROcodone-acetaminophen (NORCO) 10-325 MG tablet ;pt contact [place], [place] AT [place]

## 2019-12-04 NOTE — Telephone Encounter (Signed)
Pt is requesting Dr Daryll Drown to call back, regarding the booster vaccine [phone]

## 2019-12-04 NOTE — Telephone Encounter (Signed)
I sent 3 refills last time.  He just needs to call his pharmacy.   Thanks

## 2019-12-06 NOTE — Telephone Encounter (Signed)
LVM for him to call the clinic back.

## 2019-12-06 NOTE — Telephone Encounter (Signed)
Attempted to call patient back today 10/6 at 1044am.

## 2020-01-02 ENCOUNTER — Other Ambulatory Visit: Payer: Self-pay

## 2020-01-02 NOTE — Telephone Encounter (Signed)
HYDROcodone-acetaminophen (NORCO) 10-325 MG tablet, refill request @  [id number] - [place], [place] - [address] AT [address] Phone:  [phone]  Fax:  [phone]     

## 2020-01-02 NOTE — Telephone Encounter (Signed)
3 RX's were sent on 11/07/19.  TC to patient, self-identified VM obtained and message left for him to contact his pharmacy for refills. SChaplin, RN,BSN

## 2020-02-02 ENCOUNTER — Telehealth: Payer: Self-pay

## 2020-02-02 DIAGNOSIS — M4800 Spinal stenosis, site unspecified: Secondary | ICD-10-CM

## 2020-02-02 MED ORDER — HYDROCODONE-ACETAMINOPHEN 10-325 MG PO TABS: 1.0000 | ORAL_TABLET | Freq: Three times a day (TID) | ORAL | 0 refills | Status: DC | PRN

## 2020-02-02 NOTE — Telephone Encounter (Signed)
Need refill on HYDROcodone-acetaminophen (NORCO) 10-325 MG tablet  ;pt contact [id number] - [place], [place] AT [place], [place] [address] AT [place]

## 2020-02-06 DIAGNOSIS — D709 Neutropenia, unspecified: Secondary | ICD-10-CM | POA: Diagnosis not present

## 2020-02-06 DIAGNOSIS — Z0000 Encounter for general adult medical examination without abnormal findings: Secondary | ICD-10-CM | POA: Diagnosis not present

## 2020-02-06 DIAGNOSIS — I10 Essential (primary) hypertension: Secondary | ICD-10-CM | POA: Diagnosis not present

## 2020-02-06 NOTE — Telephone Encounter (Signed)
Patient scheduled in first available appointment with Dr. Daryll Drown on 03/15/2020 at 8:45 am.  Appt card has been mailed.

## 2020-02-07 NOTE — Telephone Encounter (Signed)
Refill sent.  Thank you for scheduling

## 2020-03-06 ENCOUNTER — Other Ambulatory Visit: Payer: Self-pay

## 2020-03-06 ENCOUNTER — Telehealth: Payer: Self-pay

## 2020-03-06 DIAGNOSIS — M4800 Spinal stenosis, site unspecified: Secondary | ICD-10-CM

## 2020-03-06 MED ORDER — HYDROCODONE-ACETAMINOPHEN 10-325 MG PO TABS: 1.0000 | ORAL_TABLET | Freq: Three times a day (TID) | ORAL | 0 refills | Status: DC | PRN

## 2020-03-06 NOTE — Telephone Encounter (Signed)
HYDROcodone-acetaminophen (NORCO) 10-325 MG tablet, refill request @  [id number] - [place], [place] - [address] AT [address] Phone:  [phone]  Fax:  [phone]     

## 2020-03-06 NOTE — Telephone Encounter (Signed)
Med was sent earlier today. Kinnie Feil, BSN, RN-BC

## 2020-03-06 NOTE — Telephone Encounter (Signed)
Need refill on HYDROcodone-acetaminophen (NORCO) 10-325 MG tablet ;pt contact [phone]  ° °[id number] - [place], [place] - [address] AT [address] °

## 2020-03-07 NOTE — Telephone Encounter (Signed)
Patient called in stating his insurance changed as of Mar 02, 2020 and he is now having trouble getting his pain med. Call placed to [place] at [place]. States insurance will only pay for 7 days unless a PA is done. She will fax [place] the PA request. L. Joylynn Defrancesco, BSN, RN-BC

## 2020-03-11 NOTE — Telephone Encounter (Signed)
Call to Pharmacy patient picked up 7  day supply on Friday.  As informed by Pharmacy that he would not be able to get the remainder without a new prescription.

## 2020-03-11 NOTE — Telephone Encounter (Signed)
Patient called back wanting to know status of PA. Will forward to PA staff. [name], BSN, RN-BC

## 2020-03-14 ENCOUNTER — Telehealth: Payer: Self-pay | Admitting: *Deleted

## 2020-03-14 DIAGNOSIS — M4800 Spinal stenosis, site unspecified: Secondary | ICD-10-CM

## 2020-03-14 NOTE — Telephone Encounter (Signed)
Call to [facility] about patient's pain,medication.  Spoke with Pharmacist who informed patient that when he picked up a 2 week supply that he would need a new prescription as the remaining amount on the prescription would not be dispensed. Needs new prescription sent  as patient does  Not want to run out.  Sander Nephew, RN 03/14/2020 10:12 AM

## 2020-03-14 NOTE — Telephone Encounter (Signed)
Patient called back. States he only received 7 day supply of hydrocodone and he is afraid he will run out. Discussed with PA staff who will contact pharmacy. [name], BSN, RN-BC

## 2020-03-15 ENCOUNTER — Encounter: Payer: Medicare Other | Admitting: Internal Medicine

## 2020-03-15 MED ORDER — HYDROCODONE-ACETAMINOPHEN 10-325 MG PO TABS: 1.0000 | ORAL_TABLET | Freq: Three times a day (TID) | ORAL | 0 refills | Status: DC | PRN

## 2020-03-15 NOTE — Telephone Encounter (Signed)
Rx sent 

## 2020-03-29 ENCOUNTER — Telehealth: Payer: Self-pay

## 2020-03-29 ENCOUNTER — Encounter: Payer: Medicare Other | Admitting: Internal Medicine

## 2020-03-29 NOTE — Telephone Encounter (Signed)
Missed appointment with his PCP today. I called patient today was not able to leave message his mailbox was full. We will send an appointment for patient by mail.

## 2020-04-11 ENCOUNTER — Other Ambulatory Visit: Payer: Self-pay | Admitting: Internal Medicine

## 2020-04-11 DIAGNOSIS — M4800 Spinal stenosis, site unspecified: Secondary | ICD-10-CM

## 2020-04-11 MED ORDER — HYDROCODONE-ACETAMINOPHEN 10-325 MG PO TABS: 1.0000 | ORAL_TABLET | Freq: Three times a day (TID) | ORAL | 0 refills | Status: DC | PRN

## 2020-04-11 NOTE — Telephone Encounter (Signed)
MED REFILL REQUEST  HYDROcodone-acetaminophen ([place]) 10-325 MG tablet   [facility] [place], [place] AT [address] Phone:  [phone]  Fax:  [phone]

## 2020-04-11 NOTE — Telephone Encounter (Signed)
Last rx written 03/15/20. Last OV 06/05/19 with Dr Koleen Distance. Next OV 05/31/20. UDS 06/05/19.

## 2020-04-12 ENCOUNTER — Telehealth: Payer: Self-pay

## 2020-04-12 NOTE — Telephone Encounter (Signed)
HYDROcodone-acetaminophen (NORCO) 10-325 MG tablet, refill request @  [id number] - [place], [place] - [address] AT [address] Phone:  [phone]  Fax:  [phone]     

## 2020-04-12 NOTE — Telephone Encounter (Signed)
90 tabs sent yesterday to requested pharmacy. Patient notified and states pharmacist told him he can pick up refill on 04/14/2020. Nothing further needed. [name], BSN, RN-BC

## 2020-05-07 ENCOUNTER — Other Ambulatory Visit: Payer: Self-pay

## 2020-05-07 DIAGNOSIS — M4800 Spinal stenosis, site unspecified: Secondary | ICD-10-CM

## 2020-05-07 MED ORDER — HYDROCODONE-ACETAMINOPHEN 10-325 MG PO TABS: 1.0000 | ORAL_TABLET | Freq: Three times a day (TID) | ORAL | 0 refills | Status: DC | PRN

## 2020-05-07 NOTE — Telephone Encounter (Signed)
  HYDROcodone-acetaminophen (NORCO) 10-325 MG tablet, REFILL REQUEST @  [id number] - [place], [place] - [address] AT [address] Phone:  [phone]  Fax:  [phone]      

## 2020-05-16 ENCOUNTER — Encounter: Payer: Self-pay | Admitting: *Deleted

## 2020-05-16 NOTE — Progress Notes (Signed)

## 2020-05-31 ENCOUNTER — Encounter: Payer: Medicare HMO | Admitting: Internal Medicine

## 2020-06-04 ENCOUNTER — Ambulatory Visit (INDEPENDENT_AMBULATORY_CARE_PROVIDER_SITE_OTHER): Payer: Medicare HMO | Admitting: Internal Medicine

## 2020-06-04 ENCOUNTER — Other Ambulatory Visit: Payer: Self-pay

## 2020-06-04 VITALS — BP 148/92 | HR 72 | Temp 98.1°F | Ht 73.0 in | Wt 224.8 lb

## 2020-06-04 DIAGNOSIS — E7800 Pure hypercholesterolemia, unspecified: Secondary | ICD-10-CM

## 2020-06-04 DIAGNOSIS — Z0000 Encounter for general adult medical examination without abnormal findings: Secondary | ICD-10-CM

## 2020-06-04 DIAGNOSIS — C61 Malignant neoplasm of prostate: Secondary | ICD-10-CM

## 2020-06-04 DIAGNOSIS — L409 Psoriasis, unspecified: Secondary | ICD-10-CM | POA: Diagnosis not present

## 2020-06-04 DIAGNOSIS — H348122 Central retinal vein occlusion, left eye, stable: Secondary | ICD-10-CM | POA: Diagnosis not present

## 2020-06-04 DIAGNOSIS — M4800 Spinal stenosis, site unspecified: Secondary | ICD-10-CM

## 2020-06-04 DIAGNOSIS — I10 Essential (primary) hypertension: Secondary | ICD-10-CM | POA: Diagnosis not present

## 2020-06-04 DIAGNOSIS — Z79891 Long term (current) use of opiate analgesic: Secondary | ICD-10-CM | POA: Diagnosis not present

## 2020-06-04 MED ORDER — LISINOPRIL 20 MG PO TABS: 2.0000 | ORAL_TABLET | Freq: Every day | ORAL | 1 refills | Status: DC

## 2020-06-04 NOTE — Assessment & Plan Note (Signed)
He is currently under treatment with radioactive implants.  He is following with urology.  I removed rapaflo from his medication list today as his LUTS has improved.

## 2020-06-04 NOTE — Assessment & Plan Note (Signed)
His psoriasis is not well controlled at this time.  He has significant itching.  He was given a cream by his dermatologist but he doesn't feel like this is controlling his symptoms.  He would like to get a 2nd opinion.    Referral to Dermatology.

## 2020-06-04 NOTE — Assessment & Plan Note (Signed)
He is on chronic opioid therapy for this issue.  He has tried to wean himself off of his TID dosing and has had flares of pain.  He prefers to be on a lower dose, but continues to have pain if not taking as prescribed.  We discussed that he has a good reason for pain and is controlled on TID dosing and should continue this medication.   Plan UDS today Refill hydrocodone-apap

## 2020-06-04 NOTE — Assessment & Plan Note (Signed)
He is due for FIT testing, will send him home with this today.

## 2020-06-04 NOTE — Assessment & Plan Note (Signed)
He continues to follow with ophthalmology.  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  He wears sunglasses to combat this issue.

## 2020-06-04 NOTE — Patient Instructions (Addendum)
Mr. Cromartie - -  Thank you for coming in today!  It was lovely to see you!  For your psoriasis, I have sent in a referral for you to see a new dermatologist.   Please collect a stool sample and send it in for colon cancer screening  For your blood pressure: Please INCREASE Your lisinopril to 40mg  daily (take 2 tablets daily)  Please come back for a blood pressure check in the resident clinic in 1-2 months.   Please come back to see me in 4-6 months.    Thank you!

## 2020-06-04 NOTE — Assessment & Plan Note (Signed)
BP today was elevated and was again on recheck.  He is on 3 medications.  He can go up on his lisinopril, so we will increase this to 40mg  (2 tablets) daily.  He will remain on hctz and amlodipine.  We will plan to check blood work (bmet) at next follow up visit.   Plan Continue amlodipine and hctz at current doses Increase lisinopril to 40mg  daily Return in 1 month for BP check and BMET.

## 2020-06-04 NOTE — Progress Notes (Signed)
   Subjective:    Patient ID: Jeremy Hill, male    DOB: Apr 07, 1945, 75 y.o.   MRN: [id number]  6 month follow up for HTN  HPI   Jeremy Hill is a 75 year old man with PMH of CRVO of the left eye, HTN, psoriasis, spinal stenosis on chronic opioid therapy, BPH, h/o prostate cancer, HLD who presents for follow up.   Today, he notes he is doing well.  His daughter, who lives in the [place], surprised him with a visit recently.  His blood pressure was high on recheck.  He notes taking his medications.  His urologist took him off rapaflo and this was discontinued from his medication list.  He notes drainage from his eyes and difficulty adjusting to light.     Review of Systems  Constitutional: Negative for activity change, appetite change and unexpected weight change.  Eyes: Positive for discharge (clear). Negative for itching.  Respiratory: Negative for cough and shortness of breath.   Cardiovascular: Negative for chest pain and leg swelling.  Gastrointestinal: Negative for abdominal distention, blood in stool, constipation and diarrhea.  Genitourinary: Negative for difficulty urinating and dysuria.  Musculoskeletal: Positive for arthralgias and back pain.  Skin: Positive for rash (psoriatic plaques on flexures).       Objective:   Physical Exam Vitals and nursing note reviewed.  Constitutional:      General: He is not in acute distress.    Appearance: He is not toxic-appearing.  HENT:     Head: Normocephalic and atraumatic.  Eyes:     General: No scleral icterus.       Right eye: No discharge.        Left eye: Discharge present.    Comments: He has sunglasses in place.  Bilateral conjunctival injection.  Tearing (clear) of the left eye.   Cardiovascular:     Rate and Rhythm: Normal rate and regular rhythm.  Pulmonary:     Effort: Pulmonary effort is normal. No respiratory distress.  Skin:    General: Skin is warm.     Coloration: Skin is not jaundiced.     Findings: No bruising.      Comments: He has large plaques with some silver scaling on the elbow flexures.   Neurological:     General: No focal deficit present.     Mental Status: He is alert. Mental status is at baseline.  Psychiatric:        Mood and Affect: Mood normal.        Behavior: Behavior normal.     Urine drug screen today as per pain management contract.       Assessment & Plan:  F/U in 1 month for BP check.  F/u in 4-6 months with me for regular follow up.

## 2020-06-04 NOTE — Assessment & Plan Note (Signed)
Last LDL was 105.  He is not currently on a statin.  His 10 year risk is > 20%, so at next visit I will plan to start him back on a statin.

## 2020-06-05 LAB — CBC
Hematocrit: 39.1 % (ref 37.5–51.0)
Hemoglobin: 12.7 g/dL — ABNORMAL LOW (ref 13.0–17.7)
MCH: 29.1 pg (ref 26.6–33.0)
MCHC: 32.5 g/dL (ref 31.5–35.7)
MCV: 90 fL (ref 79–97)
Platelets: 213 10*3/uL (ref 150–450)
RBC: 4.37 x10E6/uL (ref 4.14–5.80)
RDW: 12.5 % (ref 11.6–15.4)
WBC: 3.9 10*3/uL (ref 3.4–10.8)

## 2020-06-05 LAB — CMP14 + ANION GAP
ALT: 12 IU/L (ref 0–44)
AST: 22 IU/L (ref 0–40)
Albumin/Globulin Ratio: 1.7 (ref 1.2–2.2)
Albumin: 4.3 g/dL (ref 3.7–4.7)
Alkaline Phosphatase: 73 IU/L (ref 44–121)
Anion Gap: 15 mmol/L (ref 10.0–18.0)
BUN/Creatinine Ratio: 23 (ref 10–24)
BUN: 23 mg/dL (ref 8–27)
Bilirubin Total: 0.4 mg/dL (ref 0.0–1.2)
CO2: 22 mmol/L (ref 20–29)
Calcium: 9.3 mg/dL (ref 8.6–10.2)
Chloride: 99 mmol/L (ref 96–106)
Creatinine, Ser: 0.98 mg/dL (ref 0.76–1.27)
Globulin, Total: 2.5 g/dL (ref 1.5–4.5)
Glucose: 128 mg/dL — ABNORMAL HIGH (ref 65–99)
Potassium: 3.8 mmol/L (ref 3.5–5.2)
Sodium: 136 mmol/L (ref 134–144)
Total Protein: 6.8 g/dL (ref 6.0–8.5)
eGFR: 82 mL/min/{1.73_m2} (ref >59)

## 2020-06-11 ENCOUNTER — Encounter: Payer: Self-pay | Admitting: Internal Medicine

## 2020-06-11 LAB — TOXASSURE SELECT,+ANTIDEPR,UR

## 2020-06-12 ENCOUNTER — Other Ambulatory Visit: Payer: Self-pay

## 2020-06-12 DIAGNOSIS — M4800 Spinal stenosis, site unspecified: Secondary | ICD-10-CM

## 2020-06-12 MED ORDER — HYDROCODONE-ACETAMINOPHEN 10-325 MG PO TABS: 1.0000 | ORAL_TABLET | Freq: Three times a day (TID) | ORAL | 0 refills | Status: DC | PRN

## 2020-06-12 NOTE — Telephone Encounter (Signed)
  HYDROcodone-acetaminophen (NORCO) 10-325 MG tablet, REFILL REQUEST @  [facility], [address] [place] AT [address] Phone:  [phone]  Fax:  [phone]

## 2020-06-12 NOTE — Telephone Encounter (Signed)
Last office visit: 06/04/20 Last UDS: 06/04/20 Last Refill: last written 05/07/20 #90 Next appt: none schedule at this time

## 2020-07-08 ENCOUNTER — Encounter: Payer: Self-pay | Admitting: Internal Medicine

## 2020-07-08 ENCOUNTER — Ambulatory Visit (INDEPENDENT_AMBULATORY_CARE_PROVIDER_SITE_OTHER): Payer: Medicare HMO | Admitting: Internal Medicine

## 2020-07-08 VITALS — BP 125/81 | HR 81 | Temp 98.2°F | Ht 73.0 in | Wt 222.6 lb

## 2020-07-08 DIAGNOSIS — Z0000 Encounter for general adult medical examination without abnormal findings: Secondary | ICD-10-CM | POA: Diagnosis not present

## 2020-07-08 DIAGNOSIS — I10 Essential (primary) hypertension: Secondary | ICD-10-CM | POA: Diagnosis not present

## 2020-07-08 NOTE — Assessment & Plan Note (Signed)
-   Patient dropped off his FIT test today, will follow up results.

## 2020-07-08 NOTE — Patient Instructions (Addendum)
Thank you for trusting me with your care. To recap, today we discussed the following:   1. Essential hypertension - Continue current regimen. Blood pressure is controlled.  BP: 125/81  - BMP8+Anion Gap, I will follow up with results.   2. Routine health maintenance  - Fecal occult blood, immunochemical

## 2020-07-08 NOTE — Progress Notes (Signed)
   CC: hypertension   HPI:Jeremy Hill is a 75 y.o. male who presents for evaluation of hypertension. Please see individual problem based A/P for details.   Past Medical History:  Diagnosis Date  . Anemia 2008   borderline low Hg/Hct = 12.6/39.6 (01/10/2007), no anemia panel available and patient had refused colonoscopy, last colonoscopy done was in 2005 and the results were normal, done by Dr. Collene Mares  . Depression   . Hypertension   . Neutropenia 2008   noted on cbc (01/10/2007) WBC = 3.9, also CBC done 08/2006 showed WBC = 3.0, unclear etiology; CXR done 08/2006 showed questionable right lung nodule and the follow up CT was recommended, there is no data in the [facility] that it was done  . Osteomyelitis ([place]) 12/2001   per MRI of the spine - GIVEN THE ABNORMALITY AT THE T8-9 LEVEL, INFECTION AT THESE LATTER REGIONS CANNOT BE COMPLETELY EXCLUDED AND  WILL NEED TO BE FOLLOWED CLOSELY. SCATTERED DEGENERATIVE CHANGES IN THE LOWER  THORACIC/LUMBAR SPINE  AT THE L4-5 SIGNIFICANT NEURAL FORAMINAL NARROWING (R>L).  DECREASED SIGNAL INTENSITY OF BONE MARROW. UNDERLYING ANEMIA/INFILTRATIVE PROCESS/ LYMPHOMA.   . Osteomyelitis, chronic ([place]) 2004   persistant osteomyelitis per MRI 04/2002 and also progressive at the same level as in 2003 T8-9 level  . Prostate cancer ([place])    Review of Systems:   Review of Systems  Constitutional: Negative for chills and fever.  Eyes: Negative for blurred vision and double vision.  Neurological: Negative for dizziness and headaches.     Physical Exam: Vitals:   07/08/20 1430 07/08/20 1439  BP: (!) 146/82 125/81  Pulse: 87 81  Temp: 98.2 F (36.8 C)   TempSrc: Oral   SpO2: 98%   Weight: 222 lb 9.6 oz (101 kg)   Height: 6\' 1"  (1.854 m)    General: Well kept, NAD.  HEENT: Conjunctiva nl , antiicteric sclerae, moist mucous membranes, no exudate or erythema Cardiovascular: Normal rate, regular rhythm.  No murmurs, rubs, or gallops Pulmonary : Equal breath  sounds, No wheezes, rales, or rhonchi Abdominal: soft, nontender,  bowel sounds present Ext: No edema in lower extremities, no tenderness to palpation of lower extremities.   Assessment & Plan:   See Encounters Tab for problem based charting.  Patient discussed with Dr. Evette Doffing

## 2020-07-08 NOTE — Progress Notes (Deleted)
Subjective:   Patient ID: Jeremy Hill male   DOB: 1945/08/03 75 y.o.   MRN: [id number]  HPI: Jeremy Hill is a 75 y.o. male with a past medical history of hypertension, psoriasis, BPH, prostate cancer, spinal stenosis, and hyperlipidemia who presents today for a routine follow up.  HTN - amlodipine 10 mg, HCTZ 25 mg, lisinopril 40 mg  - BMP today (last Bmp 1 year ago)   HLD - LDL 105 in 2020 - recheck lipid panel today - restart statin  Psoriasis - seen by Derm   BPH -    History of prostate cancer - treated with radioactive implants, followed by Urology   Spinal stenosis  - hydrocodone- acetaminophen 10mg  - 325 mg    Please see problem based charting for more details.     Past Medical History:  Diagnosis Date  . Anemia 2008   borderline low Hg/Hct = 12.6/39.6 (01/10/2007), no anemia panel available and patient had refused colonoscopy, last colonoscopy done was in 2005 and the results were normal, done by Dr. Collene Mares  . Depression   . Hypertension   . Neutropenia 2008   noted on cbc (01/10/2007) WBC = 3.9, also CBC done 08/2006 showed WBC = 3.0, unclear etiology; CXR done 08/2006 showed questionable right lung nodule and the follow up CT was recommended, there is no data in the [facility] that it was done  . Osteomyelitis ([facility]) 12/2001   per MRI of the spine - GIVEN THE ABNORMALITY AT THE T8-9 LEVEL, INFECTION AT THESE LATTER REGIONS CANNOT BE COMPLETELY EXCLUDED AND  WILL NEED TO BE FOLLOWED CLOSELY. SCATTERED DEGENERATIVE CHANGES IN THE LOWER  THORACIC/LUMBAR SPINE  AT THE L4-5 SIGNIFICANT NEURAL FORAMINAL NARROWING (R>L).  DECREASED SIGNAL INTENSITY OF BONE MARROW. UNDERLYING ANEMIA/INFILTRATIVE PROCESS/ LYMPHOMA.   . Osteomyelitis, chronic ([place]) 2004   persistant osteomyelitis per MRI 04/2002 and also progressive at the same level as in 2003 T8-9 level  . Prostate cancer [facility])    Current Outpatient Medications  Medication Sig Dispense Refill  . amLODipine  (NORVASC) 10 MG tablet TAKE 1 TABLET(10 MG) BY MOUTH DAILY 90 tablet 1  . hydrochlorothiazide (HYDRODIURIL) 25 MG tablet TAKE 1 TABLET(25 MG) BY MOUTH DAILY 90 tablet 1  . HYDROcodone-acetaminophen (NORCO) 10-325 MG tablet Take 1 tablet by mouth every 8 (eight) hours as needed for severe pain. 90 tablet 0  . lisinopril (ZESTRIL) 20 MG tablet Take 2 tablets (40 mg total) by mouth daily. 180 tablet 1   No current facility-administered medications for this visit.   Family History  Problem Relation Age of Onset  . Heart disease Mother   . Heart disease Father   . Diabetes Sister   . Breast cancer Sister   . Suicidality Brother   . Prostate cancer Neg Hx   . Colon cancer Neg Hx   . Pancreatic cancer Neg Hx    Social History   Socioeconomic History  . Marital status: Divorced    Spouse name: Not on file  . Number of children: 3  . Years of education: Not on file  . Highest education level: Not on file  Occupational History    Comment: retired  Tobacco Use  . Smoking status: Never Smoker  . Smokeless tobacco: Never Used  Vaping Use  . Vaping Use: Never used  Substance and Sexual Activity  . Alcohol use: No    Alcohol/week: 0.0 standard drinks  . Drug use: No  . Sexual activity: Not Currently  Other  Topics Concern  . Not on file  Social History Narrative   Recently divorced.    Social Determinants of Health   Financial Resource Strain: Not on file  Food Insecurity: Not on file  Transportation Needs: Not on file  Physical Activity: Not on file  Stress: Not on file  Social Connections: Not on file   Review of Systems: Pertinent items noted in HPI and remainder of comprehensive ROS otherwise negative. Objective:  Physical Exam: There were no vitals filed for this visit.   General: Well appearing and in no acute distress, appears stated age Neuro: A&O x4, normal affect HEENT: Normocephalic, atraumatic, PERRLA, EOMI, normal sclerae without scleral icterus or injection.  Moist mucous membranes. Clear oropharynx Cardiovascular: RRR, no m/r/g. Normal S1 and S2 without S3 or S4. Pulses 2+ in bilateral UE and LE. No peripheral edema Pulmonary: CTAB, no wheezes, rhonchi or rales. Normal WOB, no clubbing  Abdominal: Abdomen soft and non-distended. Normal bowel sounds in all four quadrants. No tenderness to palpation, guarding, or rebound tenderness Skin: Warm and dry with no rashes, cuts, or bruises MSK: Normal ROM of all extremities. Strength 5/5 in bilateral UE and LE  Assessment & Plan:  Please see problem based charting for more details.

## 2020-07-09 LAB — FECAL OCCULT BLOOD, IMMUNOCHEMICAL: Fecal Occult Bld: NEGATIVE

## 2020-07-09 LAB — BMP8+ANION GAP
Anion Gap: 13 mmol/L (ref 10.0–18.0)
BUN/Creatinine Ratio: 14 (ref 10–24)
BUN: 16 mg/dL (ref 8–27)
CO2: 24 mmol/L (ref 20–29)
Calcium: 9.4 mg/dL (ref 8.6–10.2)
Chloride: 100 mmol/L (ref 96–106)
Creatinine, Ser: 1.18 mg/dL (ref 0.76–1.27)
Glucose: 95 mg/dL (ref 65–99)
Potassium: 4 mmol/L (ref 3.5–5.2)
Sodium: 137 mmol/L (ref 134–144)
eGFR: 66 mL/min/{1.73_m2} (ref >59)

## 2020-07-09 NOTE — Progress Notes (Signed)
Internal Medicine Clinic Attending  Case discussed with Dr. Steen  At the time of the visit.  We reviewed the resident's history and exam and pertinent patient test results.  I agree with the assessment, diagnosis, and plan of care documented in the resident's note.  

## 2020-07-10 ENCOUNTER — Other Ambulatory Visit: Payer: Self-pay

## 2020-07-10 DIAGNOSIS — M4800 Spinal stenosis, site unspecified: Secondary | ICD-10-CM

## 2020-07-10 NOTE — Telephone Encounter (Signed)
Need refill on HYDROcodone-acetaminophen (NORCO) 10-325 MG tablet ,pt contact [facility], [place] AT [address]

## 2020-07-10 NOTE — Progress Notes (Signed)
Called and left voicemail for patient. Normal BMP and FIT test negative.

## 2020-07-12 MED ORDER — HYDROCODONE-ACETAMINOPHEN 10-325 MG PO TABS: 1.0000 | ORAL_TABLET | Freq: Three times a day (TID) | ORAL | 0 refills | Status: DC | PRN

## 2020-07-12 NOTE — Assessment & Plan Note (Signed)
Hypertension: Patient's BP today is 125/81 on recheck,  with a goal of <140/80. The patient endorses adherence to his medication regimen.   Plan: Continue amlodipine 10 mg Continue Lisinopril 40 mg daily Continue HCTZ 25 mg daily  - BMP

## 2020-07-15 NOTE — Telephone Encounter (Signed)
Filled

## 2020-07-17 DIAGNOSIS — L400 Psoriasis vulgaris: Secondary | ICD-10-CM | POA: Diagnosis not present

## 2020-08-02 ENCOUNTER — Encounter: Payer: Self-pay | Admitting: Internal Medicine

## 2020-08-04 ENCOUNTER — Other Ambulatory Visit: Payer: Self-pay | Admitting: Internal Medicine

## 2020-08-04 DIAGNOSIS — I10 Essential (primary) hypertension: Secondary | ICD-10-CM

## 2020-08-14 ENCOUNTER — Other Ambulatory Visit: Payer: Self-pay

## 2020-08-14 DIAGNOSIS — M4800 Spinal stenosis, site unspecified: Secondary | ICD-10-CM

## 2020-08-14 NOTE — Telephone Encounter (Signed)
Last rx written 07/12/20. Last OV 07/08/20. Next OV has not been scheduled. UDS 06/04/20.

## 2020-08-14 NOTE — Telephone Encounter (Signed)
HYDROcodone-acetaminophen (NORCO) 10-325 MG tablet, refill request @  [facility] [place], [place] AT [address] Phone:  [phone]  Fax:  [phone]

## 2020-08-15 MED ORDER — HYDROCODONE-ACETAMINOPHEN 10-325 MG PO TABS: 1.0000 | ORAL_TABLET | Freq: Three times a day (TID) | ORAL | 0 refills | Status: DC | PRN

## 2020-08-15 NOTE — Telephone Encounter (Signed)
Requesting  HYDROcodone-acetaminophen (NORCO) 10-325 MG tablet to be filled by today.  Please call pt back.

## 2020-08-20 NOTE — Progress Notes (Signed)
Things That May Be Affecting Your Health:  Alcohol  Hearing loss  Pain    Depression  Home Safety  Sexual Health   Diabetes  Lack of physical activity  Stress   Difficulty with daily activities X Loneliness  Tiredness   Drug use  Medicines  Tobacco use   Falls  Motor Vehicle Safety  Weight   Food choices  Oral Health X Other - prostate cancer, CRVO    YOUR PERSONALIZED HEALTH PLAN : 1. Schedule your next subsequent Medicare Wellness visit in one year 2. Attend all of your regular appointments to address your medical issues 3. Complete the preventative screenings and services   Annual Wellness Visit   Medicare Covered Preventative Screenings and [place] Men and Women Who How Often Need? Date of Last Service Action  Abdominal Aortic Aneurysm Adults with AAA risk factors Once No   Never smoker  Alcohol Misuse and Counseling All Adults Screening once a year if no alcohol misuse. Counseling up to 4 face to face sessions.     Bone Density Measurement  Adults at risk for osteoporosis Once every 2 yrs      Lipid Panel Z13.6 All adults without CV disease Once every 5 yrs Yes  03/30/2018  High LDL  Colorectal Cancer  Stool sample or Colonoscopy All adults 33 and older  Once every year Every 10 years No 07/08/20  Neg  Depression All Adults Once a year  Today   Diabetes Screening Blood glucose, post glucose load, or GTT Z13.1 All adults at risk Pre-diabetics Once per year Twice per year No     Diabetes  Self-Management Training All adults Diabetics 10 hrs first year; 2 hours subsequent years. Requires Copay No    Glaucoma Diabetics Family history of glaucoma African Americans 46 yrs + Hispanic Americans 69 yrs + Annually - requires coppay No   Sees ophthalmology for CRVO  Hepatitis C Z72.89 or F19.20 High Risk for HCV Born between 1945 and 1965 Annually Once No 12/16/2016  neg  HIV Z11.4 All adults based on risk Annually btw ages 72 & 14 regardless of  risk Annually > 65 yrs if at increased risk No 03/14/2008  neg  Lung Cancer Screening Asymptomatic adults aged 57-77 with 30 pack yr history and current smoker OR quit within the last 15 yrs Annually Must have counseling and shared decision making documentation before first screen No     Medical Nutrition Therapy Adults with  Diabetes Renal disease Kidney transplant within past 3 yrs 3 hours first year; 2 hours subsequent years No    Obesity and Counseling All adults Screening once a year Counseling if BMI 30 or higher No Today BMI 29  Tobacco Use Counseling Adults who use tobacco  Up to 8 visits in one year     Vaccines Z23 Hepatitis B Influenza  Pneumonia  Adults  Once Once every flu season Two different vaccines separated by one year     Next Annual Wellness Visit People with Medicare Every year  Today     Services & Screenings Women Who How Often Need  Date of Last Service Action  Mammogram  Z12.31 Women over 52 One baseline ages 98-39. Annually ager 40 yrs+      Pap tests All women Annually if high risk. Every 2 yrs for normal risk women      Screening for cervical cancer with  Pap (Z01.419 nl or Z01.411abnl) & HPV Z11.51 Women aged 83 to 85  Once every 5 yrs     Screening pelvic and breast exams All women Annually if high risk. Every 2 yrs for normal risk women     Sexually Transmitted Diseases Chlamydia Gonorrhea Syphilis All at risk adults Annually for non pregnant females at increased risk         [employer] Men Who How Ofter Need  Date of Last Service Action  Prostate Cancer - DRE & PSA Men over 50 Annually.  DRE might require a copay. Yes     Ask him about urology follow up.   Sexually Transmitted Diseases Syphilis All at risk adults Annually for men at increased risk      Health Maintenance List Health Maintenance  Topic Date Due   Zoster Vaccines- Shingrix (1 of 2) Never done   COVID-19 Vaccine (3 - Moderna risk series) 06/10/2019    COLONOSCOPY (Pts 45-58yrs Insurance coverage will need to be confirmed)  03/10/2020   INFLUENZA VACCINE  09/30/2020   COLON CANCER SCREENING ANNUAL FOBT  07/08/2021   TETANUS/TDAP  03/15/2022   Hepatitis C Screening  Completed   PNA vac Low Risk Adult  Completed   HPV VACCINES  Aged Out

## 2020-08-28 DIAGNOSIS — L400 Psoriasis vulgaris: Secondary | ICD-10-CM | POA: Diagnosis not present

## 2020-09-11 ENCOUNTER — Other Ambulatory Visit: Payer: Self-pay

## 2020-09-11 ENCOUNTER — Telehealth: Payer: Self-pay

## 2020-09-11 DIAGNOSIS — M4800 Spinal stenosis, site unspecified: Secondary | ICD-10-CM

## 2020-09-11 MED ORDER — HYDROCODONE-ACETAMINOPHEN 10-325 MG PO TABS: 1.0000 | ORAL_TABLET | Freq: Three times a day (TID) | ORAL | 0 refills | Status: DC | PRN

## 2020-09-11 NOTE — Telephone Encounter (Signed)
HYDROcodone-acetaminophen (NORCO) 10-325 MG tablet, REFILL REQUEST @  [facility], [place] AT [address] Phone:  [phone]  Fax:  [phone]

## 2020-09-11 NOTE — Telephone Encounter (Signed)
This Rx was sent at 1032 this AM.

## 2020-09-11 NOTE — Telephone Encounter (Signed)
HYDROcodone-acetaminophen (NORCO) 10-325 MG tablet, refill request @  [facility] [place], [place] AT [address] Phone:  [phone]  Fax:  [phone]

## 2020-10-14 ENCOUNTER — Other Ambulatory Visit: Payer: Self-pay | Admitting: *Deleted

## 2020-10-14 DIAGNOSIS — M4800 Spinal stenosis, site unspecified: Secondary | ICD-10-CM

## 2020-10-14 MED ORDER — HYDROCODONE-ACETAMINOPHEN 10-325 MG PO TABS: 1.0000 | ORAL_TABLET | Freq: Three times a day (TID) | ORAL | 0 refills | Status: DC | PRN

## 2020-10-14 NOTE — Telephone Encounter (Signed)
Last rx written  09/11/20. Last OV 06/04/20 with PCP ; saw Dr Court Joy 07/08/20. Next OV has not been scheduled. UDS 06/04/20.

## 2020-10-15 NOTE — Addendum Note (Signed)
Addended by: Ebbie Latus on: 10/15/2020 04:16 PM   Modules accepted: Orders

## 2020-10-15 NOTE — Telephone Encounter (Signed)
Patient walked into clinic today due to had switch pharmacies.  [facility] on [address] is no longer able to provide patient with pain medications.  They informed patient they are not able to transfer his refill of hyrodocone to the [facility] on [address].  Please transfer this medications and all future refill requests to the new pharmacy listed.  If any questions please call him at [phone].

## 2020-10-16 ENCOUNTER — Telehealth: Payer: Self-pay

## 2020-10-16 MED ORDER — HYDROCODONE-ACETAMINOPHEN 10-325 MG PO TABS: 1.0000 | ORAL_TABLET | Freq: Three times a day (TID) | ORAL | 0 refills | Status: DC | PRN

## 2020-10-16 NOTE — Addendum Note (Signed)
Addended by: Velora Heckler on: 10/16/2020 10:26 AM   Modules accepted: Orders

## 2020-10-16 NOTE — Telephone Encounter (Signed)
This Rx was sent on 8/15 to [facility]. It needs to be sent to [facility] store. Message sent to PCP in separate encounter. Left detailed message on patient's self-identified VM with this information.

## 2020-10-16 NOTE — Telephone Encounter (Signed)
Pt is requesting his HYDROcodone-acetaminophen ([place]) 10-325 MG tablet sent to  [id number] - [place], [place] AT [address] Phone:  [phone]  Fax:  [phone]

## 2020-10-28 DIAGNOSIS — L400 Psoriasis vulgaris: Secondary | ICD-10-CM | POA: Diagnosis not present

## 2020-11-12 ENCOUNTER — Telehealth: Payer: Self-pay | Admitting: Internal Medicine

## 2020-11-12 DIAGNOSIS — M4800 Spinal stenosis, site unspecified: Secondary | ICD-10-CM

## 2020-11-12 MED ORDER — HYDROCODONE-ACETAMINOPHEN 10-325 MG PO TABS: 1.0000 | ORAL_TABLET | Freq: Three times a day (TID) | ORAL | 0 refills | Status: DC | PRN

## 2020-11-12 NOTE — Telephone Encounter (Signed)
Last rx written  10/16/20. Last OV 06/04/20 with PCP. Next OV  12/20/20. UDS  06/04/20.

## 2020-11-12 NOTE — Telephone Encounter (Signed)
Refill Request   HYDROcodone-acetaminophen (NORCO) 10-325 MG tablet  [facility] [phone] - [place], [place] AT [address] (Ph: [phone])

## 2020-11-12 NOTE — Telephone Encounter (Signed)
refilled 

## 2020-11-19 ENCOUNTER — Ambulatory Visit (INDEPENDENT_AMBULATORY_CARE_PROVIDER_SITE_OTHER): Payer: Medicare HMO | Admitting: Internal Medicine

## 2020-11-19 VITALS — BP 134/87 | HR 53 | Wt 217.9 lb

## 2020-11-19 DIAGNOSIS — Z0000 Encounter for general adult medical examination without abnormal findings: Secondary | ICD-10-CM | POA: Diagnosis not present

## 2020-11-19 DIAGNOSIS — Z23 Encounter for immunization: Secondary | ICD-10-CM | POA: Diagnosis not present

## 2020-11-19 DIAGNOSIS — S86112A Strain of other muscle(s) and tendon(s) of posterior muscle group at lower leg level, left leg, initial encounter: Secondary | ICD-10-CM | POA: Diagnosis not present

## 2020-11-19 NOTE — Progress Notes (Signed)
   CC: left leg pain  HPI:  [name] is a 75 y.o. person with HTN, BPH, and spinal stenosis who presents to the [facility] for left sided leg pain. Please see problem-based list for further details, assessments, and plans.   Past Medical History:  Diagnosis Date   Anemia 2008   borderline low Hg/Hct = 12.6/39.6 (01/10/2007), no anemia panel available and patient had refused colonoscopy, last colonoscopy done was in 2005 and the results were normal, done by Dr. Collene Mares   Depression    Hypertension    Neutropenia 2008   noted on cbc (01/10/2007) WBC = 3.9, also CBC done 08/2006 showed WBC = 3.0, unclear etiology; CXR done 08/2006 showed questionable right lung nodule and the follow up CT was recommended, there is no data in the [place] that it was done   Osteomyelitis ([place]) 12/2001   per MRI of the spine - GIVEN THE ABNORMALITY AT THE T8-9 LEVEL, INFECTION AT THESE LATTER REGIONS CANNOT BE COMPLETELY EXCLUDED AND  WILL NEED TO BE FOLLOWED CLOSELY. SCATTERED DEGENERATIVE CHANGES IN THE LOWER  THORACIC/LUMBAR SPINE  AT THE L4-5 SIGNIFICANT NEURAL FORAMINAL NARROWING (R>L).  DECREASED SIGNAL INTENSITY OF BONE MARROW. UNDERLYING ANEMIA/INFILTRATIVE PROCESS/ LYMPHOMA.    Osteomyelitis, chronic ([place]) 2004   persistant osteomyelitis per MRI 04/2002 and also progressive at the same level as in 2003 T8-9 level   Prostate cancer ([place])    Review of Systems: Review of Systems  Constitutional:  Negative for chills and fever.  HENT: Negative.    Eyes: Negative.   Respiratory: Negative.    Cardiovascular:  Negative for chest pain and palpitations.  Musculoskeletal:  Positive for back pain and myalgias. Negative for falls and neck pain.       Left calf pain   Neurological:  Negative for dizziness and headaches.    Physical Exam:  Vitals:   11/19/20 1429  BP: 134/87  Pulse: (!) 53  SpO2: 99%  Weight: 217 lb 14.4 oz (98.8 kg)   General: No acute distress. Head: Normocephalic. Atraumatic. CV:  RRR. No murmurs, rubs, or gallops. No LE edema Pulmonary: Lungs CTAB. Normal effort. No wheezing or rales. Abdominal: Soft, nontender, nondistended. Normal bowel sounds. Extremities/MSK: Palpable radial and DP pulses. Normal ROM. Pain with extension of left leg, tenderness to palpation of left calf. 5/5 strength bilateral lower extremities.  Skin: Warm and dry. No obvious rash or lesions. Neuro: A&Ox3. Moves all extremities. Normal sensation. No focal deficit. Psych: Normal mood and affect   Assessment & Plan:   See Encounters Tab for problem based charting.  Patient seen with Dr. Evette Doffing

## 2020-11-19 NOTE — Assessment & Plan Note (Signed)
Flu shot given today

## 2020-11-19 NOTE — Assessment & Plan Note (Signed)
Patient states that for the past 2 days he has had left leg pain, localized to his calf muscle. He describes it as a squeezing sensation and he is ultimately unsure of what triggered this. He denies any recent injuries or trauma, but notes that he does a lot of heavy lifting with his day-to-day activities and could have possibly strained his muscle.  He states that his pain is worse with walking and is only improved when he elevates his leg at night.  Patient also denies any recent travel, history of cancer, or any redness or swelling to the area, thus low suspicion for DVT.  With his pain being worse with walking, there was initial concern for claudication, however, his distal pulses remained intact.  On exam he has normal flexion and extension of his left leg, however, he has significant pain in his calf with extension of his leg.  He is tender to palpation in his left gastrocnemius muscle, but there is no warmth, redness, or swelling to this area. His strength remains intact and is 5/5 in his bilateral lower extremities.  His pain is likely secondary to a strain of his gastrocnemius muscle.  Discussed supportive measures with the patient, such as taking NSAIDs for a few days and keeping  up with his daily activities and exercise. Made patient aware that this will likely resolve on its own, with time.   Plan: - Supportive care; NSAIDs and continue with exercise as tolerable

## 2020-11-19 NOTE — Patient Instructions (Signed)
Thank you, Mr.Curvin D Gunner for allowing [place] to provide your care today. Today we discussed: Left calf pain:  Continue to keep up with walking and exercising, as you are able to tolerate. You can take Aleve or any other NSAID medication for a few days to help with the pain, but this will likely resolve on its own in a few weeks.  Healthcare maintenance: flu shot given today  I have ordered the following labs for you:  Lab Orders  No laboratory test(s) ordered today     Referrals ordered today:   Referral Orders  No referral(s) requested today     I have ordered the following medication/changed the following medications:   Stop the following medications: There are no discontinued medications.   Start the following medications: No orders of the defined types were placed in this encounter.    Follow up: 3 months     Should you have any questions or concerns please call the internal medicine clinic at [phone].     Buddy Duty, D.O. [place]

## 2020-11-20 NOTE — Progress Notes (Signed)
Internal Medicine Clinic Attending ° °I saw and evaluated the patient.  I personally confirmed the key portions of the history and exam documented by Dr. Atway and I reviewed pertinent patient test results.  The assessment, diagnosis, and plan were formulated together and I agree with the documentation in the resident’s note.  °

## 2020-12-09 ENCOUNTER — Other Ambulatory Visit: Payer: Self-pay

## 2020-12-09 DIAGNOSIS — M4800 Spinal stenosis, site unspecified: Secondary | ICD-10-CM

## 2020-12-09 MED ORDER — HYDROCODONE-ACETAMINOPHEN 10-325 MG PO TABS: 1.0000 | ORAL_TABLET | Freq: Three times a day (TID) | ORAL | 0 refills | Status: DC | PRN

## 2020-12-20 ENCOUNTER — Ambulatory Visit (INDEPENDENT_AMBULATORY_CARE_PROVIDER_SITE_OTHER): Payer: Medicare HMO | Admitting: Internal Medicine

## 2020-12-20 ENCOUNTER — Other Ambulatory Visit: Payer: Self-pay

## 2020-12-20 ENCOUNTER — Encounter: Payer: Self-pay | Admitting: Internal Medicine

## 2020-12-20 VITALS — BP 149/95 | HR 64 | Temp 98.2°F | Ht 73.0 in | Wt 223.6 lb

## 2020-12-20 DIAGNOSIS — I10 Essential (primary) hypertension: Secondary | ICD-10-CM | POA: Diagnosis not present

## 2020-12-20 DIAGNOSIS — D709 Neutropenia, unspecified: Secondary | ICD-10-CM | POA: Diagnosis not present

## 2020-12-20 DIAGNOSIS — E663 Overweight: Secondary | ICD-10-CM

## 2020-12-20 DIAGNOSIS — E7800 Pure hypercholesterolemia, unspecified: Secondary | ICD-10-CM | POA: Diagnosis not present

## 2020-12-20 DIAGNOSIS — C61 Malignant neoplasm of prostate: Secondary | ICD-10-CM

## 2020-12-20 DIAGNOSIS — L409 Psoriasis, unspecified: Secondary | ICD-10-CM

## 2020-12-20 DIAGNOSIS — M48061 Spinal stenosis, lumbar region without neurogenic claudication: Secondary | ICD-10-CM

## 2020-12-20 MED ORDER — AMLODIPINE BESYLATE 10 MG PO TABS: 10.0000 mg | ORAL_TABLET | Freq: Every day | ORAL | 3 refills | Status: DC

## 2020-12-20 NOTE — Assessment & Plan Note (Signed)
We discussed exercise today and increasing use of walking as a good exercise.  He noted understanding.  Reports recent weight increase is from stress around the death of his sister.

## 2020-12-20 NOTE — Assessment & Plan Note (Signed)
Will check lipid profile today.  He is not currently taking a statin.  Based on his ASCVD risk score, he will likely need to start a statin.   Plan Check Lipid profile Likely need to start a statin

## 2020-12-20 NOTE — Assessment & Plan Note (Signed)
This issue is chronic and controlled.  He has some occasional twinges and worsening when he does too much activity.  He has been on a stable and chronic dose of hydrocodone-apap for a long time.  He has no red flags for misuse at this time.  PDMP reviewed and appropriate.   Plan Continue current therapy

## 2020-12-20 NOTE — Assessment & Plan Note (Signed)
He continues to have seeds in place.  He is due to see Urology next month. His urinary symptoms are at baseline for him and he monitors these closely.  No hematuria.

## 2020-12-20 NOTE — Patient Instructions (Signed)
Jeremy Hill  Thank you for coming in to see me today.  Please continue to take your medications as you are.  Please continue to follow up with dermatology and urology as planned.   Come back to see me in 3 months, sooner if you need to.   Thank you!  Gilles Chiquito, MD

## 2020-12-20 NOTE — Assessment & Plan Note (Signed)
He is following with dermatology, using clobetasol cream and finding some improvement.   Plan Continue to follow with Derm

## 2020-12-20 NOTE — Progress Notes (Signed)
   Subjective:    Patient ID: Jeremy Hill, male    DOB: June 29, 1945, 75 y.o.   MRN: [id number]  CC: 3 month follow up for HTN and back pain.   HPI  Jeremy Hill notes that he is doing well.  He has had some increased stress with his family around the death of his sister and her estate.  There are some hard feelings and issues with selling family land.  He notes that this has caused weight gain and also some increased blood pressure at home.  For the most part, his blood pressure is "in the green" on his blood pressure cuff, but he has occasional yellow and red numbers.  His daughter is in the states and is helping with his diet.  He is currently working with a company to [employer] and he likes this work.    He has been following with Dermatology and feels that his psoriasis is improving.  He is due to see Urology next month for his prostate cancer follow up.    He has received the flu shot and we discussed shingles vaccination today.     Review of Systems  Constitutional:  Negative for activity change, appetite change and fatigue.  Respiratory:  Negative for chest tightness, shortness of breath and stridor.   Cardiovascular:  Negative for chest pain and leg swelling.  Gastrointestinal:  Negative for abdominal distention, anal bleeding and constipation.  Genitourinary:  Positive for frequency. Negative for difficulty urinating, dysuria and hematuria.  Musculoskeletal:  Positive for arthralgias and back pain. Negative for gait problem.  Neurological:  Negative for headaches.  Psychiatric/Behavioral:  Negative for decreased concentration and dysphoric mood.       Objective:   Physical Exam Vitals and nursing note reviewed.  Constitutional:      General: He is not in acute distress.    Appearance: Normal appearance. He is obese. He is not toxic-appearing.  HENT:     Head: Normocephalic and atraumatic.  Cardiovascular:     Rate and Rhythm: Normal rate and regular rhythm.     Heart  sounds: No murmur heard. Pulmonary:     Effort: Pulmonary effort is normal. No respiratory distress.     Breath sounds: Normal breath sounds. No wheezing.  Abdominal:     General: Abdomen is flat. Bowel sounds are normal. There is no distension.     Palpations: Abdomen is soft.     Tenderness: There is no abdominal tenderness.  Musculoskeletal:        General: No swelling or tenderness.  Skin:    General: Skin is warm and dry.     Coloration: Skin is not jaundiced.     Findings: Lesion (psoriatic, chronic) present.  Neurological:     General: No focal deficit present.     Mental Status: He is alert. Mental status is at baseline.  Psychiatric:        Mood and Affect: Mood normal.        Behavior: Behavior normal.    Lipid, BMET, CBC      Assessment & Plan:  Return in 3 months, sooner if needed.

## 2020-12-21 LAB — LIPID PANEL
Chol/HDL Ratio: 3.8 ratio (ref 0.0–5.0)
Cholesterol, Total: 182 mg/dL (ref 100–199)
HDL: 48 mg/dL (ref >39)
LDL Chol Calc (NIH): 122 mg/dL — ABNORMAL HIGH (ref 0–99)
Triglycerides: 62 mg/dL (ref 0–149)
VLDL Cholesterol Cal: 12 mg/dL (ref 5–40)

## 2020-12-21 LAB — CBC
Hematocrit: 41.1 % (ref 37.5–51.0)
Hemoglobin: 13.3 g/dL (ref 13.0–17.7)
MCH: 28.7 pg (ref 26.6–33.0)
MCHC: 32.4 g/dL (ref 31.5–35.7)
MCV: 89 fL (ref 79–97)
Platelets: 213 10*3/uL (ref 150–450)
RBC: 4.64 x10E6/uL (ref 4.14–5.80)
RDW: 12.7 % (ref 11.6–15.4)
WBC: 3.1 10*3/uL — ABNORMAL LOW (ref 3.4–10.8)

## 2020-12-21 LAB — BMP8+ANION GAP
Anion Gap: 16 mmol/L (ref 10.0–18.0)
BUN/Creatinine Ratio: 11 (ref 10–24)
BUN: 11 mg/dL (ref 8–27)
CO2: 23 mmol/L (ref 20–29)
Calcium: 9.3 mg/dL (ref 8.6–10.2)
Chloride: 100 mmol/L (ref 96–106)
Creatinine, Ser: 0.99 mg/dL (ref 0.76–1.27)
Glucose: 95 mg/dL (ref 70–99)
Potassium: 3.7 mmol/L (ref 3.5–5.2)
Sodium: 139 mmol/L (ref 134–144)
eGFR: 81 mL/min/{1.73_m2} (ref >59)

## 2021-01-07 ENCOUNTER — Other Ambulatory Visit: Payer: Self-pay

## 2021-01-07 DIAGNOSIS — M4800 Spinal stenosis, site unspecified: Secondary | ICD-10-CM

## 2021-01-07 MED ORDER — HYDROCODONE-ACETAMINOPHEN 10-325 MG PO TABS: 1.0000 | ORAL_TABLET | Freq: Three times a day (TID) | ORAL | 0 refills | Status: DC | PRN

## 2021-01-30 ENCOUNTER — Other Ambulatory Visit: Payer: Self-pay

## 2021-01-30 DIAGNOSIS — M4800 Spinal stenosis, site unspecified: Secondary | ICD-10-CM

## 2021-01-30 DIAGNOSIS — I10 Essential (primary) hypertension: Secondary | ICD-10-CM

## 2021-01-30 MED ORDER — HYDROCHLOROTHIAZIDE 25 MG PO TABS: 25.0000 mg | ORAL_TABLET | Freq: Every day | ORAL | 1 refills | Status: DC

## 2021-01-30 MED ORDER — LISINOPRIL 20 MG PO TABS: 40.0000 mg | ORAL_TABLET | Freq: Every day | ORAL | 1 refills | Status: DC

## 2021-01-30 MED ORDER — HYDROCODONE-ACETAMINOPHEN 10-325 MG PO TABS: 1.0000 | ORAL_TABLET | Freq: Three times a day (TID) | ORAL | 0 refills | Status: DC | PRN

## 2021-01-30 NOTE — Telephone Encounter (Signed)
Amlodipine #90 with 3 refills sent 12/20/20. Will forward other requests.

## 2021-01-30 NOTE — Telephone Encounter (Signed)
amLODipine (NORVASC) 10 MG tablet  hydrochlorothiazide (HYDRODIURIL) 25 MG tablet  HYDROcodone-acetaminophen (NORCO) 10-325 MG tablet  lisinopril (ZESTRIL) 20 MG tablet, REFILL REQUEST @ [facility] [phone] - [place], [place] - [place]

## 2021-03-06 ENCOUNTER — Other Ambulatory Visit: Payer: Self-pay

## 2021-03-06 DIAGNOSIS — M4800 Spinal stenosis, site unspecified: Secondary | ICD-10-CM

## 2021-03-06 NOTE — Telephone Encounter (Signed)
HYDROcodone-acetaminophen (NORCO) 10-325 MG tablet, REFILL REQUEST @ [facility] [phone] - [place], [place] AT [place].

## 2021-03-07 MED ORDER — HYDROCODONE-ACETAMINOPHEN 10-325 MG PO TABS: 1.0000 | ORAL_TABLET | Freq: Three times a day (TID) | ORAL | 0 refills | Status: DC | PRN

## 2021-03-10 ENCOUNTER — Telehealth: Payer: Self-pay

## 2021-03-10 NOTE — Telephone Encounter (Signed)
Pt states the pharmacy told him to have the nurse to contact the pharmacy regarding HYDROcodone-acetaminophen (NORCO) 10-325 MG tablet.

## 2021-03-10 NOTE — Telephone Encounter (Signed)
Call placed to [place] at [place]. States hydrocodone is needing a PA. Will route to PA Staff.

## 2021-03-11 NOTE — Telephone Encounter (Signed)
Pt calling to request something to take over the counter until his Pain medication has been prio authorized.  Pt requesting a call back.

## 2021-03-11 NOTE — Telephone Encounter (Signed)
Patient can take tylenol 1000mg  up to 3 times per day and Ibuprofen 600mg  three times a day for 2-3 days until medication is dispensed.

## 2021-03-11 NOTE — Telephone Encounter (Signed)
Pt called / informed he "can take tylenol 1000mg  up to 3 times per day and Ibuprofen 600mg  three times a day for 2-3 days until medication is dispensed" per Dr Daryll Drown. Stated tylenol makes him sick but he will get Ibuprofen.

## 2021-03-12 ENCOUNTER — Telehealth: Payer: Self-pay | Admitting: Internal Medicine

## 2021-03-12 MED ORDER — OXYCODONE HCL 5 MG PO CAPS: 5.0000 mg | ORAL_CAPSULE | Freq: Two times a day (BID) | ORAL | 0 refills | Status: AC | PRN

## 2021-03-12 NOTE — Telephone Encounter (Signed)
Mr. Gersten is at risk for going into opiate withdrawal while awaiting his PA to go through.  I have sent in 3 day bridge Rx of Oxycodone 5mg  IR BID (6 tablets) to take until his prescription comes through.  He does not have OUD, but has been on hydrocodone-apap for many years.  He can pay for these out of pocket if need be.   I have discussed with Triage Nurse, Holley Raring and she will communicate with patient.   Gilles Chiquito, MD

## 2021-03-12 NOTE — Telephone Encounter (Signed)
Called to inform pt PA may take 2 -3 days per [place].

## 2021-03-12 NOTE — Telephone Encounter (Signed)
Pt called / informed of rx for Oxy-IR 5 mg qty #6 tabs. And he can pay out of pocket if not cover by his insurance - stated he understands.

## 2021-03-12 NOTE — Telephone Encounter (Signed)
Refill Request  Pt is calling back to f/u with his PA on the following medication.  Pt states the OTC meds are not working and he wants to know when his medication will be ready for pick up.     HYDROcodone-acetaminophen (NORCO) 10-325 MG tablet    [id number] - [place], [place]

## 2021-03-12 NOTE — Telephone Encounter (Signed)
Can we send in something in the interim, just a 3 day supply?

## 2021-03-13 ENCOUNTER — Telehealth: Payer: Self-pay

## 2021-03-13 NOTE — Telephone Encounter (Signed)
PA was requested for pt ( HYDROCODONE- ACETAMINOPHEN ) was requested for PT 1/11  .. I have tried many time to run the PA on cover my meds several time but keep getting the response of member no found .Marland Kitchen so I will continue to try being that the new years has came in I am hoping it is the reason pt id number not registered as of yet

## 2021-03-13 NOTE — Telephone Encounter (Signed)
Can someone check on his PA for his hydrocodone?  Thanks!

## 2021-03-13 NOTE — Telephone Encounter (Signed)
Returned call to patient. No answer. [facility] has not been set up.

## 2021-03-13 NOTE — Telephone Encounter (Signed)
Requesting to speak with a nurse about oxycodone (OXY-IR) 5 MG capsule, states this medication do not work for him. Please cal pt back.

## 2021-03-19 ENCOUNTER — Telehealth: Payer: Self-pay | Admitting: *Deleted

## 2021-03-19 NOTE — Telephone Encounter (Signed)
Did he ever get his hydrocodone?  That is the medication he should be on.

## 2021-03-19 NOTE — Telephone Encounter (Signed)
Fax from [employer] Oxycodone HCL IR 5 mg Capsules will no longer be covered under patient's Insurance plan without a PA.  Patient was given a 30 day supply for now.

## 2021-04-08 ENCOUNTER — Other Ambulatory Visit: Payer: Self-pay

## 2021-04-08 DIAGNOSIS — M4800 Spinal stenosis, site unspecified: Secondary | ICD-10-CM

## 2021-04-08 MED ORDER — HYDROCODONE-ACETAMINOPHEN 10-325 MG PO TABS: 1.0000 | ORAL_TABLET | Freq: Three times a day (TID) | ORAL | 0 refills | Status: DC | PRN

## 2021-04-08 NOTE — Telephone Encounter (Signed)
My understanding is that hydrocodone is out most places.  If [facility] cannot [facility], he will need to call around and find a place to get it filled.   Thanks!

## 2021-04-08 NOTE — Telephone Encounter (Signed)
HYDROcodone-acetaminophen (NORCO) 10-325 MG tablet, refill request @ [id number] - [place], [place] - [address] 

## 2021-04-08 NOTE — Telephone Encounter (Signed)
Last rx written 03/07/21. Last OV 12/20/20. Next OV has not been scheduled. UDS 06/05/19.  Pt stated he was able to get rx filled last month.Stated he cannot take Oxycodone- "makes me nervous,sick".

## 2021-05-06 ENCOUNTER — Other Ambulatory Visit: Payer: Self-pay

## 2021-05-06 DIAGNOSIS — M4800 Spinal stenosis, site unspecified: Secondary | ICD-10-CM

## 2021-05-06 MED ORDER — HYDROCODONE-ACETAMINOPHEN 10-325 MG PO TABS: 1.0000 | ORAL_TABLET | Freq: Three times a day (TID) | ORAL | 0 refills | Status: DC | PRN

## 2021-05-06 NOTE — Telephone Encounter (Signed)
refilled 

## 2021-05-06 NOTE — Telephone Encounter (Signed)
Last [facility]ox[facility]ure 06/04/2020 last appt 12/20/2020.  ?

## 2021-05-06 NOTE — Telephone Encounter (Signed)
HYDROcodone-acetaminophen (NORCO) 10-325 MG tablet, refill request @ [id number] - [place], [place] - [address] 

## 2021-06-04 ENCOUNTER — Other Ambulatory Visit: Payer: Self-pay

## 2021-06-04 DIAGNOSIS — M4800 Spinal stenosis, site unspecified: Secondary | ICD-10-CM

## 2021-06-04 MED ORDER — HYDROCODONE-ACETAMINOPHEN 10-325 MG PO TABS: 1.0000 | ORAL_TABLET | Freq: Three times a day (TID) | ORAL | 0 refills | Status: DC | PRN

## 2021-06-04 NOTE — Telephone Encounter (Signed)
HYDROcodone-acetaminophen (NORCO) 10-325 MG tablet, refill request @ [id number] - [place], [place] - [address] 

## 2021-06-04 NOTE — Telephone Encounter (Signed)
Last ToxAssure was 06/04/2020.  Last appt was 12/20/2020 ?

## 2021-07-01 ENCOUNTER — Other Ambulatory Visit: Payer: Self-pay | Admitting: Internal Medicine

## 2021-07-01 DIAGNOSIS — M4800 Spinal stenosis, site unspecified: Secondary | ICD-10-CM

## 2021-07-01 NOTE — Telephone Encounter (Signed)
Last appointment 12/20/2020.  No future appts.  Last ToxAssure 06/04/2020. ?

## 2021-07-01 NOTE — Telephone Encounter (Signed)
Refill Request  HYDROcodone-acetaminophen (NORCO) 10-325 MG tablet  [id number] - [place], [place] - [address] 

## 2021-07-02 MED ORDER — HYDROCODONE-ACETAMINOPHEN 10-325 MG PO TABS: 1.0000 | ORAL_TABLET | Freq: Three times a day (TID) | ORAL | 0 refills | Status: DC | PRN

## 2021-08-04 ENCOUNTER — Other Ambulatory Visit: Payer: Self-pay

## 2021-08-04 DIAGNOSIS — M4800 Spinal stenosis, site unspecified: Secondary | ICD-10-CM

## 2021-08-04 MED ORDER — HYDROCODONE-ACETAMINOPHEN 10-325 MG PO TABS: 1.0000 | ORAL_TABLET | Freq: Three times a day (TID) | ORAL | 0 refills | Status: DC | PRN

## 2021-08-05 ENCOUNTER — Encounter: Payer: Self-pay | Admitting: Student

## 2021-08-05 ENCOUNTER — Ambulatory Visit (INDEPENDENT_AMBULATORY_CARE_PROVIDER_SITE_OTHER): Payer: Medicare (Managed Care) | Admitting: Student

## 2021-08-05 ENCOUNTER — Other Ambulatory Visit: Payer: Self-pay | Admitting: Student

## 2021-08-05 VITALS — BP 146/107 | HR 93 | Temp 98.2°F | Ht 73.0 in | Wt 228.2 lb

## 2021-08-05 DIAGNOSIS — I10 Essential (primary) hypertension: Secondary | ICD-10-CM | POA: Diagnosis not present

## 2021-08-05 DIAGNOSIS — M48061 Spinal stenosis, lumbar region without neurogenic claudication: Secondary | ICD-10-CM

## 2021-08-05 DIAGNOSIS — Z1211 Encounter for screening for malignant neoplasm of colon: Secondary | ICD-10-CM

## 2021-08-05 DIAGNOSIS — H348122 Central retinal vein occlusion, left eye, stable: Secondary | ICD-10-CM

## 2021-08-05 DIAGNOSIS — Z0000 Encounter for general adult medical examination without abnormal findings: Secondary | ICD-10-CM

## 2021-08-05 MED ORDER — SPIRONOLACTONE 25 MG PO TABS: 25.0000 mg | ORAL_TABLET | Freq: Every day | ORAL | 1 refills | Status: DC

## 2021-08-05 NOTE — Assessment & Plan Note (Signed)
Today's Vitals   08/05/21 1425 08/05/21 1435 08/05/21 1501  BP: (!) 145/97 (!) 145/107 (!) 146/107  Pulse: 91 96 93  Temp: 98.2 F (36.8 C)    TempSrc: Oral    SpO2: 96%    Weight: 228 lb 3.2 oz (103.5 kg)    Height: '6\' 1"'$  (1.854 m)    PainSc: 3      Body mass index is 30.11 kg/m.  Patient with history of HTN, currently on norvasc '10mg'$ , lisinopril '40mg'$ , and HCTZ '25mg'$  daily. Blood pressure is elevated above goal despite maximal therapy with 3 agents. STOPBANG score of 3. Will add low dose spironolactone for optimization of BP. Have advised patient to obtain a home BP cuff and keep BP log at home to bring to next visit in 2 weeks.  Plan: -continue norvasc, lisinopril, HCTZ -start spironolactone '25mg'$  daily -BP log -f/u in 2 weeks

## 2021-08-05 NOTE — Patient Instructions (Signed)
Jeremy Hill  It was a pleasure seeing you in the clinic today.   We are adding another medicine to help better control your blood pressure. This medicine is called spironolactone. Please take it once daily. Please bring the stool sample kit with you at your next visit. Place the stool sample in the kit the day before your next visit. We will check your kidney function today. Please come back in 2 weeks for follow up.  Please call our clinic at [phone] if you have any questions or concerns. The best time to call is Monday-Friday from 9am-4pm, but there is someone available 24/7 at the same number. If you need medication refills, please notify your pharmacy one week in advance and they will send [place] a request.   Thank you for letting us take part in your care. We look forward to seeing you next time!

## 2021-08-05 NOTE — Progress Notes (Signed)
   CC: f/u HTN  HPI:  [name] is a 76 y.o. male with history listed below presenting to the [facility] for f/u HTN. Please see individualized problem based charting for full HPI.  Past Medical History:  Diagnosis Date   Anemia 2008   borderline low Hg/Hct = 12.6/39.6 (01/10/2007), no anemia panel available and patient had refused colonoscopy, last colonoscopy done was in 2005 and the results were normal, done by Dr. Collene Hill   Depression    Hypertension    Neutropenia 2008   noted on cbc (01/10/2007) WBC = 3.9, also CBC done 08/2006 showed WBC = 3.0, unclear etiology; CXR done 08/2006 showed questionable right lung nodule and the follow up CT was recommended, there is no data in the [place] that it was done   Osteomyelitis ([place]) 12/2001   per MRI of the spine - GIVEN THE ABNORMALITY AT THE T8-9 LEVEL, INFECTION AT THESE LATTER REGIONS CANNOT BE COMPLETELY EXCLUDED AND  WILL NEED TO BE FOLLOWED CLOSELY. SCATTERED DEGENERATIVE CHANGES IN THE LOWER  THORACIC/LUMBAR SPINE  AT THE L4-5 SIGNIFICANT NEURAL FORAMINAL NARROWING (R>L).  DECREASED SIGNAL INTENSITY OF BONE MARROW. UNDERLYING ANEMIA/INFILTRATIVE PROCESS/ LYMPHOMA.    Osteomyelitis, chronic ([place]) 2004   persistant osteomyelitis per MRI 04/2002 and also progressive at the same level as in 2003 T8-9 level   Prostate cancer [facility])     Review of Systems:  Negative aside from that listed in individualized problem based charting.  Physical Exam:  Vitals:   08/05/21 1425 08/05/21 1435  BP: (!) 145/97 (!) 145/107  Pulse: 91 96  Temp: 98.2 F (36.8 C)   TempSrc: Oral   SpO2: 96%   Weight: 228 lb 3.2 oz (103.5 kg)   Height: '6\' 1"'$  (1.854 m)    Physical Exam Constitutional:      Appearance: He is obese. He is not ill-appearing.  HENT:     Nose: Nose normal.     Mouth/Throat:     Mouth: Mucous membranes are moist.     Pharynx: Oropharynx is clear.  Eyes:     Extraocular Movements: Extraocular movements intact.     Conjunctiva/sclera:  Conjunctivae normal.     Pupils: Pupils are equal, round, and reactive to light.  Cardiovascular:     Rate and Rhythm: Normal rate and regular rhythm.     Pulses: Normal pulses.     Heart sounds: Normal heart sounds. No murmur heard.   No gallop.  Pulmonary:     Effort: Pulmonary effort is normal.     Breath sounds: Normal breath sounds. No wheezing, rhonchi or rales.  Abdominal:     General: Bowel sounds are normal. There is no distension.     Palpations: Abdomen is soft.     Tenderness: There is no abdominal tenderness.  Musculoskeletal:        General: No swelling. Normal range of motion.  Skin:    General: Skin is warm and dry.     Comments: Psoriatic lesions on extremities.  Neurological:     General: No focal deficit present.     Mental Status: He is alert and oriented to person, place, and time.  Psychiatric:        Mood and Affect: Mood normal.        Behavior: Behavior normal.     Assessment & Plan:   See Encounters Tab for problem based charting.  Patient discussed with Dr.  Cain Sieve

## 2021-08-05 NOTE — Assessment & Plan Note (Signed)
Continues to have some blurry vision and sensitivity to light in left eye. He has not seen ophthalmology this year and will schedule another appointment. Discussed calling [facility] should he have trouble rescheduling so that we can connect him to ophthalmology again.

## 2021-08-05 NOTE — Assessment & Plan Note (Signed)
Chronic and controlled with chronic hydrocodone-apap therapy. No red flags for misuse. PDMP reviewed and appropriate. Dr. Daryll Drown (PCP) has provided additional refills recently.

## 2021-08-05 NOTE — Assessment & Plan Note (Signed)
Will provide FIT testing kit for colon cancer screening.

## 2021-08-06 LAB — BMP8+ANION GAP
Anion Gap: 14 mmol/L (ref 10.0–18.0)
BUN/Creatinine Ratio: 13 (ref 10–24)
BUN: 15 mg/dL (ref 8–27)
CO2: 25 mmol/L (ref 20–29)
Calcium: 9.7 mg/dL (ref 8.6–10.2)
Chloride: 102 mmol/L (ref 96–106)
Creatinine, Ser: 1.13 mg/dL (ref 0.76–1.27)
Glucose: 116 mg/dL — ABNORMAL HIGH (ref 70–99)
Potassium: 3.9 mmol/L (ref 3.5–5.2)
Sodium: 141 mmol/L (ref 134–144)
eGFR: 69 mL/min/{1.73_m2} (ref >59)

## 2021-08-06 NOTE — Progress Notes (Signed)
Internal Medicine Clinic Attending  Case discussed with Dr. Jinwala  At the time of the visit.  We reviewed the resident's history and exam and pertinent patient test results.  I agree with the assessment, diagnosis, and plan of care documented in the resident's note.  

## 2021-08-07 ENCOUNTER — Telehealth: Payer: Self-pay

## 2021-08-07 NOTE — Telephone Encounter (Signed)
Requesting lab results, please call pt back.  

## 2021-08-19 ENCOUNTER — Ambulatory Visit (INDEPENDENT_AMBULATORY_CARE_PROVIDER_SITE_OTHER): Payer: Medicare (Managed Care) | Admitting: Student

## 2021-08-19 DIAGNOSIS — Z1211 Encounter for screening for malignant neoplasm of colon: Secondary | ICD-10-CM

## 2021-08-19 DIAGNOSIS — I10 Essential (primary) hypertension: Secondary | ICD-10-CM

## 2021-08-19 MED ORDER — LISINOPRIL 40 MG PO TABS: 40.0000 mg | ORAL_TABLET | Freq: Every day | ORAL | 3 refills | Status: DC

## 2021-08-19 MED ORDER — HYDROCHLOROTHIAZIDE 25 MG PO TABS: 25.0000 mg | ORAL_TABLET | Freq: Every day | ORAL | 3 refills | Status: DC

## 2021-08-19 MED ORDER — AMLODIPINE BESYLATE 10 MG PO TABS: 10.0000 mg | ORAL_TABLET | Freq: Every day | ORAL | 3 refills | Status: DC

## 2021-08-19 NOTE — Assessment & Plan Note (Signed)
Today's Vitals   08/19/21 1424 08/19/21 1500  BP: (!) 131/93 (!) 143/90  Pulse: 89 70  SpO2: 99%   Weight: 226 lb 6.4 oz (102.7 kg)    Body mass index is 29.87 kg/m.  Patient living with HTN, currently taking norvasc '10mg'$ , hctz '25mg'$ , lisinopril '40mg'$ , and recently started spironolactone '25mg'$  daily (yesterday). He was prescribed spiro at last visit but there was miscommunication with his pharmacy and he was unable to get it until yesterday. BP is mildly elevated above goal, but likely has not had enough time for spiro to reach therapeutic levels. Discussed follow up in 1 month but patient would like to come back next week to ensure BP is improving which I think is reasonable. Advised patient to keep a BP log at home and bring to next visit (he needs to pick up a new BP monitor). Will check BMP at next visit in case spiro dose needs to be increased.  Plan: -continue norvasc, hctz, lisinopril (all refilled today) -continue spironolactone '25mg'$  daily -keep BP log at home and bring to next visit in 1 week -consider BMP check next week

## 2021-08-19 NOTE — Progress Notes (Signed)
   CC: f/u HTN  HPI:  JeremyJeremy Hill is a 76 y.o. male with history listed below presenting to the [facility] for f/u HTN. Please see individualized problem based charting for full HPI.  Past Medical History:  Diagnosis Date   Anemia 2008   borderline low Hg/Hct = 12.6/39.6 (01/10/2007), no anemia panel available and patient had refused colonoscopy, last colonoscopy done was in 2005 and the results were normal, done by Dr. Collene Mares   Depression    Hypertension    Neutropenia 2008   noted on cbc (01/10/2007) WBC = 3.9, also CBC done 08/2006 showed WBC = 3.0, unclear etiology; CXR done 08/2006 showed questionable right lung nodule and the follow up CT was recommended, there is no data in the [place] that it was done   Osteomyelitis ([place]) 12/2001   per MRI of the spine - GIVEN THE ABNORMALITY AT THE T8-9 LEVEL, INFECTION AT THESE LATTER REGIONS CANNOT BE COMPLETELY EXCLUDED AND  WILL NEED TO BE FOLLOWED CLOSELY. SCATTERED DEGENERATIVE CHANGES IN THE LOWER  THORACIC/LUMBAR SPINE  AT THE L4-5 SIGNIFICANT NEURAL FORAMINAL NARROWING (R>L).  DECREASED SIGNAL INTENSITY OF BONE MARROW. UNDERLYING ANEMIA/INFILTRATIVE PROCESS/ LYMPHOMA.    Osteomyelitis, chronic ([place]) 2004   persistant osteomyelitis per MRI 04/2002 and also progressive at the same level as in 2003 T8-9 level   Prostate cancer [facility])     Review of Systems:  Negative aside from that listed in individualized problem based charting.  Physical Exam:  Vitals:   08/19/21 1424 08/19/21 1500  BP: (!) 131/93 (!) 143/90  Pulse: 89 70  SpO2: 99%   Weight: 226 lb 6.4 oz (102.7 kg)    Physical Exam Constitutional:      Appearance: Normal appearance. He is not ill-appearing.  HENT:     Mouth/Throat:     Mouth: Mucous membranes are moist.     Pharynx: Oropharynx is clear.  Eyes:     Extraocular Movements: Extraocular movements intact.     Conjunctiva/sclera: Conjunctivae normal.     Pupils: Pupils are equal, round, and reactive to light.   Cardiovascular:     Rate and Rhythm: Normal rate and regular rhythm.     Pulses: Normal pulses.     Heart sounds: Normal heart sounds. No murmur heard.    No gallop.  Pulmonary:     Effort: Pulmonary effort is normal.     Breath sounds: Normal breath sounds. No wheezing, rhonchi or rales.  Abdominal:     General: Bowel sounds are normal. There is no distension.     Palpations: Abdomen is soft.     Tenderness: There is no abdominal tenderness.  Musculoskeletal:        General: No swelling. Normal range of motion.  Neurological:     General: No focal deficit present.     Mental Status: He is alert and oriented to person, place, and time.  Psychiatric:        Mood and Affect: Mood normal.        Behavior: Behavior normal.      Assessment & Plan:   See Encounters Tab for problem based charting.  Patient discussed with Dr. Heber Esparto

## 2021-08-19 NOTE — Patient Instructions (Signed)
Jeremy Hill,  It was a pleasure seeing you in the clinic today.   Please keep taking all of your blood pressure medicines as you have been. I have sent refills to your pharmacy. Please come back in 1 month for blood pressure recheck. Please try to keep a blood pressure log at home and bring it to your next visit.  Please call our clinic at [phone] if you have any questions or concerns. The best time to call is Monday-Friday from 9am-4pm, but there is someone available 24/7 at the same number. If you need medication refills, please notify your pharmacy one week in advance and they will send [place] a request.   Thank you for letting us take part in your care. We look forward to seeing you next time!

## 2021-08-20 LAB — FECAL OCCULT BLOOD, IMMUNOCHEMICAL: Fecal Occult Bld: NEGATIVE

## 2021-08-21 NOTE — Progress Notes (Signed)
Internal Medicine Clinic Attending  Case discussed with the resident at the time of the visit.  We reviewed the resident's history and exam and pertinent patient test results.  I agree with the assessment, diagnosis, and plan of care documented in the resident's note.  

## 2021-08-28 ENCOUNTER — Ambulatory Visit (INDEPENDENT_AMBULATORY_CARE_PROVIDER_SITE_OTHER): Payer: Medicare (Managed Care) | Admitting: Student

## 2021-08-28 VITALS — BP 123/82 | HR 82 | Temp 98.3°F | Ht 73.0 in | Wt 255.2 lb

## 2021-08-28 DIAGNOSIS — I10 Essential (primary) hypertension: Secondary | ICD-10-CM

## 2021-08-28 NOTE — Patient Instructions (Signed)
Jeremy Hill,  It was a pleasure seeing you in the clinic today.   Your blood pressure looks amazing today! Keep up the good work! We are going to check your potassium level today. I will call you with the results once I have them. Please come back in 3 months for your next visit.  Please call our clinic at [phone] if you have any questions or concerns. The best time to call is Monday-Friday from 9am-4pm, but there is someone available 24/7 at the same number. If you need medication refills, please notify your pharmacy one week in advance and they will send [place] a request.   Thank you for letting us take part in your care. We look forward to seeing you next time!

## 2021-08-28 NOTE — Progress Notes (Signed)
   CC: f/u HTN  HPI:  Mr.Ewen D Raben is a 76 y.o. male with history listed below presenting to the [facility] for f/u HTN. Please see individualized problem based charting for full HPI.  Past Medical History:  Diagnosis Date   Anemia 2008   borderline low Hg/Hct = 12.6/39.6 (01/10/2007), no anemia panel available and patient had refused colonoscopy, last colonoscopy done was in 2005 and the results were normal, done by Dr. Collene Mares   Depression    Hypertension    Neutropenia 2008   noted on cbc (01/10/2007) WBC = 3.9, also CBC done 08/2006 showed WBC = 3.0, unclear etiology; CXR done 08/2006 showed questionable right lung nodule and the follow up CT was recommended, there is no data in the [place] that it was done   Osteomyelitis ([place]) 12/2001   per MRI of the spine - GIVEN THE ABNORMALITY AT THE T8-9 LEVEL, INFECTION AT THESE LATTER REGIONS CANNOT BE COMPLETELY EXCLUDED AND  WILL NEED TO BE FOLLOWED CLOSELY. SCATTERED DEGENERATIVE CHANGES IN THE LOWER  THORACIC/LUMBAR SPINE  AT THE L4-5 SIGNIFICANT NEURAL FORAMINAL NARROWING (R>L).  DECREASED SIGNAL INTENSITY OF BONE MARROW. UNDERLYING ANEMIA/INFILTRATIVE PROCESS/ LYMPHOMA.    Osteomyelitis, chronic ([place]) 2004   persistant osteomyelitis per MRI 04/2002 and also progressive at the same level as in 2003 T8-9 level   Prostate cancer [facility])     Review of Systems:  Negative aside from that listed in individualized problem based charting.  Physical Exam:  Vitals:   08/28/21 1606  BP: 123/82  Pulse: 82  Temp: 98.3 F (36.8 C)  TempSrc: Oral  SpO2: 98%  Weight: 255 lb 3.2 oz (115.8 kg)  Height: '6\' 1"'$  (1.854 m)   Physical Exam Constitutional:      Appearance: Normal appearance. He is obese. He is not ill-appearing.  HENT:     Mouth/Throat:     Mouth: Mucous membranes are moist.     Pharynx: Oropharynx is clear.  Cardiovascular:     Rate and Rhythm: Normal rate and regular rhythm.     Pulses: Normal pulses.     Heart sounds: Normal heart  sounds. No murmur heard.    No gallop.  Pulmonary:     Effort: Pulmonary effort is normal.     Breath sounds: Normal breath sounds. No wheezing, rhonchi or rales.  Abdominal:     General: Bowel sounds are normal. There is no distension.     Palpations: Abdomen is soft.     Tenderness: There is no abdominal tenderness.  Musculoskeletal:        General: No swelling. Normal range of motion.  Skin:    General: Skin is warm and dry.  Neurological:     General: No focal deficit present.     Mental Status: He is alert and oriented to person, place, and time.      Assessment & Plan:   See Encounters Tab for problem based charting.  Patient discussed with Dr. Evette Doffing

## 2021-08-28 NOTE — Assessment & Plan Note (Addendum)
Today's Vitals   08/28/21 1606  BP: 123/82  Pulse: 82  Temp: 98.3 F (36.8 C)  TempSrc: Oral  SpO2: 98%  Weight: 255 lb 3.2 oz (115.8 kg)  Height: '6\' 1"'$  (1.854 m)  PainSc: 0-No pain   Body mass index is 33.67 kg/m.  Jeremy Hill presents for follow up of his HTN. He is currently taking norvasc '10mg'$ , HCTZ '25mg'$ , lisinopril '40mg'$ , and spironolactone '25mg'$  daily (started earlier this month). His blood pressure is under excellent control today. We will check a BMP today to ensure there is no hyperkalemia after initiation of spironolactone.   Plan: -continue norvasc, hctz, lisinopril, spiro -f/u BMP -f/u in 3 months for next visit  ADDENDUM: BMP showing Cr bump to 1.41, which is still within limits after initiation of spironolactone therapy (<30% increase). Will have him come back for a lab-only visit to repeat BMP to assess kidney function. If stable, will continue spironolactone. If worsening, will need to stop spiro and consider alternative options.

## 2021-08-29 LAB — BMP8+ANION GAP
Anion Gap: 17 mmol/L (ref 10.0–18.0)
BUN/Creatinine Ratio: 16 (ref 10–24)
BUN: 23 mg/dL (ref 8–27)
CO2: 24 mmol/L (ref 20–29)
Calcium: 9.7 mg/dL (ref 8.6–10.2)
Chloride: 99 mmol/L (ref 96–106)
Creatinine, Ser: 1.41 mg/dL — ABNORMAL HIGH (ref 0.76–1.27)
Glucose: 96 mg/dL (ref 70–99)
Potassium: 4.1 mmol/L (ref 3.5–5.2)
Sodium: 140 mmol/L (ref 134–144)
eGFR: 53 mL/min/{1.73_m2} — ABNORMAL LOW (ref >59)

## 2021-08-29 NOTE — Progress Notes (Signed)
Internal Medicine Clinic Attending  Case discussed with Dr. Jinwala  At the time of the visit.  We reviewed the resident's history and exam and pertinent patient test results.  I agree with the assessment, diagnosis, and plan of care documented in the resident's note.  

## 2021-09-03 ENCOUNTER — Other Ambulatory Visit: Payer: Self-pay

## 2021-09-03 DIAGNOSIS — M4800 Spinal stenosis, site unspecified: Secondary | ICD-10-CM

## 2021-09-03 MED ORDER — HYDROCODONE-ACETAMINOPHEN 10-325 MG PO TABS: 1.0000 | ORAL_TABLET | Freq: Three times a day (TID) | ORAL | 0 refills | Status: DC | PRN

## 2021-09-03 NOTE — Telephone Encounter (Signed)
HYDROcodone-acetaminophen (NORCO) 10-325 MG tablet, refill request @ [facility] [place], [place] AT [place].

## 2021-09-08 NOTE — Addendum Note (Signed)
Addended by: Virl Axe on: 09/08/2021 01:10 PM   Modules accepted: Orders

## 2021-09-30 ENCOUNTER — Other Ambulatory Visit: Payer: Self-pay | Admitting: Internal Medicine

## 2021-09-30 DIAGNOSIS — M4800 Spinal stenosis, site unspecified: Secondary | ICD-10-CM

## 2021-09-30 MED ORDER — HYDROCODONE-ACETAMINOPHEN 10-325 MG PO TABS: 1.0000 | ORAL_TABLET | Freq: Three times a day (TID) | ORAL | 0 refills | Status: DC | PRN

## 2021-09-30 NOTE — Telephone Encounter (Signed)
Refill Request  HYDROcodone-acetaminophen (NORCO) 10-325 MG tablet  [id number] - [place], [place]

## 2021-10-19 ENCOUNTER — Other Ambulatory Visit: Payer: Self-pay | Admitting: Student

## 2021-10-20 ENCOUNTER — Other Ambulatory Visit: Payer: Self-pay | Admitting: Internal Medicine

## 2021-10-27 ENCOUNTER — Other Ambulatory Visit: Payer: Self-pay | Admitting: Internal Medicine

## 2021-10-27 DIAGNOSIS — M4800 Spinal stenosis, site unspecified: Secondary | ICD-10-CM

## 2021-10-27 MED ORDER — HYDROCODONE-ACETAMINOPHEN 10-325 MG PO TABS: 1.0000 | ORAL_TABLET | Freq: Three times a day (TID) | ORAL | 0 refills | Status: DC | PRN

## 2021-10-27 NOTE — Telephone Encounter (Signed)
Refill  HYDROcodone-acetaminophen (NORCO) 10-325 MG tablet  [id number] - [place], [place]

## 2021-10-27 NOTE — Telephone Encounter (Signed)
Last ToxAssure 06/04/2020.  Last appointment 08/08/2021.

## 2021-11-24 ENCOUNTER — Other Ambulatory Visit: Payer: Self-pay | Admitting: Internal Medicine

## 2021-11-24 DIAGNOSIS — M4800 Spinal stenosis, site unspecified: Secondary | ICD-10-CM

## 2021-11-24 NOTE — Telephone Encounter (Signed)
MED REFILL REQUEST  HYDROcodone-acetaminophen (NORCO) 10-325 MG tablet  [phone] - [place], [place] AT [place] Phone:  [phone]  Fax:  [phone]

## 2021-11-25 MED ORDER — HYDROCODONE-ACETAMINOPHEN 10-325 MG PO TABS: 1.0000 | ORAL_TABLET | Freq: Three times a day (TID) | ORAL | 0 refills | Status: DC | PRN

## 2021-11-27 ENCOUNTER — Telehealth: Payer: Self-pay | Admitting: *Deleted

## 2021-11-27 ENCOUNTER — Other Ambulatory Visit (HOSPITAL_COMMUNITY): Payer: Self-pay

## 2021-11-27 DIAGNOSIS — M4800 Spinal stenosis, site unspecified: Secondary | ICD-10-CM

## 2021-11-27 MED ORDER — HYDROCODONE-ACETAMINOPHEN 10-325 MG PO TABS
1.0000 | ORAL_TABLET | Freq: Three times a day (TID) | ORAL | 0 refills | Status: DC | PRN
  Filled 2021-11-27: qty 21, 7d supply, fill #0
  Filled 2021-12-02: qty 21, 7d supply, fill #1
  Filled 2021-12-03: qty 69, 23d supply, fill #1

## 2021-11-27 NOTE — Telephone Encounter (Signed)
Call from Protection at [employer] will only cover 7 days worth initially as they are considering patient Naive.  Will cover the addition pills without a PA  after 7 days.  Unable to reach patient to inform him that he can get the 7 day supply for now.

## 2021-11-27 NOTE — Telephone Encounter (Signed)
Rx sent 

## 2021-11-27 NOTE — Telephone Encounter (Signed)
Call from patient unable to find Pharmacy that has  his Norco in stock. Call to [place]. Currently have.  Patient would like to get prescription sent to the [place].

## 2021-12-01 NOTE — Telephone Encounter (Signed)
Patient aware that he can call [facility] on 10/4 for remaining amt of hydrocodone.

## 2021-12-02 ENCOUNTER — Other Ambulatory Visit (HOSPITAL_COMMUNITY): Payer: Self-pay

## 2021-12-03 ENCOUNTER — Other Ambulatory Visit (HOSPITAL_COMMUNITY): Payer: Self-pay

## 2021-12-11 ENCOUNTER — Ambulatory Visit: Payer: Medicare PPO

## 2021-12-11 DIAGNOSIS — Z23 Encounter for immunization: Secondary | ICD-10-CM

## 2021-12-25 ENCOUNTER — Other Ambulatory Visit (HOSPITAL_COMMUNITY): Payer: Self-pay

## 2021-12-25 ENCOUNTER — Other Ambulatory Visit: Payer: Self-pay | Admitting: *Deleted

## 2021-12-25 DIAGNOSIS — M4800 Spinal stenosis, site unspecified: Secondary | ICD-10-CM

## 2021-12-25 NOTE — Telephone Encounter (Signed)
Last appointment 08/28/2021. Next Appointment 12/26/2021.  Last ToxAssure 06/14/2020.

## 2021-12-26 ENCOUNTER — Encounter: Payer: Medicare PPO | Admitting: Internal Medicine

## 2021-12-26 ENCOUNTER — Encounter: Payer: Self-pay | Admitting: Internal Medicine

## 2021-12-26 ENCOUNTER — Other Ambulatory Visit (HOSPITAL_COMMUNITY): Payer: Self-pay

## 2021-12-26 ENCOUNTER — Ambulatory Visit: Payer: Medicare PPO

## 2021-12-26 MED ORDER — HYDROCODONE-ACETAMINOPHEN 10-325 MG PO TABS
1.0000 | ORAL_TABLET | Freq: Three times a day (TID) | ORAL | 0 refills | Status: DC | PRN
  Filled 2021-12-26: qty 90, 30d supply, fill #0

## 2022-01-16 ENCOUNTER — Ambulatory Visit (INDEPENDENT_AMBULATORY_CARE_PROVIDER_SITE_OTHER): Payer: Medicare PPO | Admitting: Student

## 2022-01-16 ENCOUNTER — Encounter: Payer: Medicare PPO | Admitting: Internal Medicine

## 2022-01-16 VITALS — BP 135/88 | HR 57 | Temp 98.0°F | Ht 73.0 in | Wt 225.0 lb

## 2022-01-16 DIAGNOSIS — I10 Essential (primary) hypertension: Secondary | ICD-10-CM

## 2022-01-16 NOTE — Patient Instructions (Signed)
Jeremy Hill, it was a pleasure seeing you today!  Today we discussed: - You should be taking four blood pressure medications: amlodipine, hydrochlorothiazide, lisinopril, and spironolactone. I will get some lab work today to see how your kidneys are doing.  I have ordered the following labs today:   Lab Orders         BMP8+Anion Gap       Follow-up: 3 months   Please make sure to arrive 15 minutes prior to your next appointment. If you arrive late, you may be asked to reschedule.   We look forward to seeing you next time. Please call our clinic at [phone] if you have any questions or concerns. The best time to call is Monday-Friday from 9am-4pm, but there is someone available 24/7. If after hours or the weekend, call the main hospital number and ask for the Internal Medicine Resident On-Call. If you need medication refills, please notify your pharmacy one week in advance and they will send [place] a request.  Thank you for letting us take part in your care. Wishing you the best!  Thank you, Sanjuan Dame, MD

## 2022-01-18 LAB — BMP8+ANION GAP
Anion Gap: 14 mmol/L (ref 10.0–18.0)
BUN/Creatinine Ratio: 15 (ref 10–24)
BUN: 18 mg/dL (ref 8–27)
CO2: 25 mmol/L (ref 20–29)
Calcium: 9.4 mg/dL (ref 8.6–10.2)
Chloride: 101 mmol/L (ref 96–106)
Creatinine, Ser: 1.23 mg/dL (ref 0.76–1.27)
Glucose: 106 mg/dL — ABNORMAL HIGH (ref 70–99)
Potassium: 4.2 mmol/L (ref 3.5–5.2)
Sodium: 140 mmol/L (ref 134–144)
eGFR: 62 mL/min/{1.73_m2} (ref >59)

## 2022-01-18 NOTE — Progress Notes (Signed)
   CC: blood pressure follow-up  HPI:  [name] is a 76 y.o. person with medical history as below presenting to [facility] for blood pressure follow-up  Please see problem-based list for further details, assessments, and plans.  Past Medical History:  Diagnosis Date   Anemia 2008   borderline low Hg/Hct = 12.6/39.6 (01/10/2007), no anemia panel available and patient had refused colonoscopy, last colonoscopy done was in 2005 and the results were normal, done by Dr. Collene Hill   Depression    Hypertension    Neutropenia 2008   noted on cbc (01/10/2007) WBC = 3.9, also CBC done 08/2006 showed WBC = 3.0, unclear etiology; CXR done 08/2006 showed questionable right lung nodule and the follow up CT was recommended, there is no data in the [place] that it was done   Osteomyelitis ([place]) 12/2001   per MRI of the spine - GIVEN THE ABNORMALITY AT THE T8-9 LEVEL, INFECTION AT THESE LATTER REGIONS CANNOT BE COMPLETELY EXCLUDED AND  WILL NEED TO BE FOLLOWED CLOSELY. SCATTERED DEGENERATIVE CHANGES IN THE LOWER  THORACIC/LUMBAR SPINE  AT THE L4-5 SIGNIFICANT NEURAL FORAMINAL NARROWING (R>L).  DECREASED SIGNAL INTENSITY OF BONE MARROW. UNDERLYING ANEMIA/INFILTRATIVE PROCESS/ LYMPHOMA.    Osteomyelitis, chronic ([place]) 2004   persistant osteomyelitis per MRI 04/2002 and also progressive at the same level as in 2003 T8-9 level   Prostate cancer [facility])    Review of Systems:  As per HPI  Physical Exam:  Vitals:   01/16/22 0900  BP: 135/88  Pulse: (!) 57  Temp: 98 F (36.7 C)  TempSrc: Oral  SpO2: 97%  Weight: 225 lb (102.1 kg)  Height: '6\' 1"'$  (1.854 m)   General: Resting comfortably in no acute distress CV: Regular rate, rhythm. No murmurs appreciated. Warm extremities.  Pulm: Normal work of breathing on room air. Clear to auscultation bilaterally. MSK: Normal bulk, tone. No peripheral edema bilaterally. Skin: Warm, dry. No rashes or lesions. Neuro: Awake, alert, conversing appropriately. Grossly  non-focal. Psych: Normal mood, affect, speech.  Assessment & Plan:   Essential hypertension BP Readings from Last 3 Encounters:  01/16/22 135/88  08/28/21 123/82  08/19/21 (!) 143/90   [name] is presenting today for blood pressure follow-up. He reports that when he went to the pharmacy recently he picked up a new prescription. He did bring his medications with him today and reports hydrochlorothiazide is a new medication for him. He took the medication for a few days but then stopped as he was unsure if he should be taking it. When he was taking it, he denies any side effects.  Discussed with [name] his four medication regimen, he verbalized understanding.  - Amlodipine '10mg'$  daily - Hydrochlorothiazide '25mg'$  daily - Lisinopril '40mg'$  daily - Spironolactone '25mg'$  daily - BMP today   Patient discussed with Dr. Jacquenette Shone, MD Internal Medicine PGY-3 Pager: [phone]

## 2022-01-18 NOTE — Assessment & Plan Note (Signed)
BP Readings from Last 3 Encounters:  01/16/22 135/88  08/28/21 123/82  08/19/21 (!) 143/90   Jeremy Hill is presenting today for blood pressure follow-up. He reports that when he went to the pharmacy recently he picked up a new prescription. He did bring his medications with him today and reports hydrochlorothiazide is a new medication for him. He took the medication for a few days but then stopped as he was unsure if he should be taking it. When he was taking it, he denies any side effects.  Discussed with Mr. Kurtenbach his four medication regimen, he verbalized understanding.  - Amlodipine '10mg'$  daily - Hydrochlorothiazide '25mg'$  daily - Lisinopril '40mg'$  daily - Spironolactone '25mg'$  daily - BMP today

## 2022-01-19 ENCOUNTER — Other Ambulatory Visit: Payer: Self-pay

## 2022-01-19 DIAGNOSIS — M4800 Spinal stenosis, site unspecified: Secondary | ICD-10-CM

## 2022-01-19 NOTE — Progress Notes (Signed)
Internal Medicine Clinic Attending ? ?Case discussed with Dr. Braswell  At the time of the visit.  We reviewed the resident?s history and exam and pertinent patient test results.  I agree with the assessment, diagnosis, and plan of care documented in the resident?s note.  ?

## 2022-01-19 NOTE — Telephone Encounter (Signed)
HYDROcodone-acetaminophen (NORCO) 10-325 MG tablet, REFILL REQUEST @ [facility].

## 2022-01-20 ENCOUNTER — Other Ambulatory Visit (HOSPITAL_COMMUNITY): Payer: Self-pay

## 2022-01-20 MED ORDER — HYDROCODONE-ACETAMINOPHEN 10-325 MG PO TABS
1.0000 | ORAL_TABLET | Freq: Three times a day (TID) | ORAL | 0 refills | Status: DC | PRN
  Filled 2022-01-20 – 2022-01-23 (×2): qty 90, 30d supply, fill #0

## 2022-01-21 ENCOUNTER — Other Ambulatory Visit (HOSPITAL_COMMUNITY): Payer: Self-pay

## 2022-01-23 ENCOUNTER — Other Ambulatory Visit (HOSPITAL_COMMUNITY): Payer: Self-pay

## 2022-02-18 ENCOUNTER — Other Ambulatory Visit: Payer: Self-pay

## 2022-02-18 DIAGNOSIS — M4800 Spinal stenosis, site unspecified: Secondary | ICD-10-CM

## 2022-02-19 ENCOUNTER — Other Ambulatory Visit (HOSPITAL_COMMUNITY): Payer: Self-pay

## 2022-02-19 MED ORDER — HYDROCODONE-ACETAMINOPHEN 10-325 MG PO TABS
1.0000 | ORAL_TABLET | Freq: Three times a day (TID) | ORAL | 0 refills | Status: DC | PRN
  Filled 2022-02-19 – 2022-02-20 (×2): qty 90, 30d supply, fill #0

## 2022-02-20 ENCOUNTER — Other Ambulatory Visit (HOSPITAL_COMMUNITY): Payer: Self-pay

## 2022-03-23 ENCOUNTER — Other Ambulatory Visit (HOSPITAL_COMMUNITY): Payer: Self-pay

## 2022-03-23 ENCOUNTER — Other Ambulatory Visit: Payer: Self-pay

## 2022-03-23 DIAGNOSIS — M4800 Spinal stenosis, site unspecified: Secondary | ICD-10-CM

## 2022-03-23 MED ORDER — HYDROCODONE-ACETAMINOPHEN 10-325 MG PO TABS
1.0000 | ORAL_TABLET | Freq: Three times a day (TID) | ORAL | 0 refills | Status: DC | PRN
  Filled 2022-03-23: qty 90, 30d supply, fill #0

## 2022-04-17 ENCOUNTER — Encounter: Payer: Self-pay | Admitting: Internal Medicine

## 2022-04-17 ENCOUNTER — Ambulatory Visit (INDEPENDENT_AMBULATORY_CARE_PROVIDER_SITE_OTHER): Payer: Medicare PPO

## 2022-04-17 ENCOUNTER — Other Ambulatory Visit: Payer: Self-pay

## 2022-04-17 ENCOUNTER — Ambulatory Visit (INDEPENDENT_AMBULATORY_CARE_PROVIDER_SITE_OTHER): Payer: Medicare PPO | Admitting: Internal Medicine

## 2022-04-17 ENCOUNTER — Other Ambulatory Visit (HOSPITAL_COMMUNITY): Payer: Self-pay

## 2022-04-17 VITALS — BP 126/70 | HR 66 | Temp 98.1°F | Ht 73.0 in | Wt 221.5 lb

## 2022-04-17 DIAGNOSIS — E663 Overweight: Secondary | ICD-10-CM | POA: Diagnosis not present

## 2022-04-17 DIAGNOSIS — E7800 Pure hypercholesterolemia, unspecified: Secondary | ICD-10-CM

## 2022-04-17 DIAGNOSIS — Z0000 Encounter for general adult medical examination without abnormal findings: Secondary | ICD-10-CM | POA: Diagnosis not present

## 2022-04-17 DIAGNOSIS — M4800 Spinal stenosis, site unspecified: Secondary | ICD-10-CM

## 2022-04-17 DIAGNOSIS — H348122 Central retinal vein occlusion, left eye, stable: Secondary | ICD-10-CM

## 2022-04-17 DIAGNOSIS — Z6825 Body mass index (BMI) 25.0-25.9, adult: Secondary | ICD-10-CM

## 2022-04-17 DIAGNOSIS — C61 Malignant neoplasm of prostate: Secondary | ICD-10-CM

## 2022-04-17 DIAGNOSIS — I10 Essential (primary) hypertension: Secondary | ICD-10-CM | POA: Diagnosis not present

## 2022-04-17 MED ORDER — HYDROCODONE-ACETAMINOPHEN 10-325 MG PO TABS: 1.0000 | ORAL_TABLET | Freq: Three times a day (TID) | ORAL | 0 refills | Status: DC | PRN

## 2022-04-17 NOTE — Progress Notes (Signed)
Internal Medicine Clinic Attending  Case and documentation reviewed.  I reviewed the AWV findings.  I agree with the assessment, diagnosis, and plan of care documented in the AWV note.     

## 2022-04-17 NOTE — Assessment & Plan Note (Signed)
Currently stable and he is taking precautions with driving.  Follow up as planned with ophthalmology.

## 2022-04-17 NOTE — Progress Notes (Signed)
Established Patient Office Visit  Subjective   Patient ID: Jeremy Hill, male    DOB: 02/09/1946  Age: 77 y.o. MRN: [id number]  Chief Complaint  Patient presents with   Check-up Visit   Medication Refill    Jeremy Hill reports that he is doing well.  He has no complaints today.  He is planning on getting back to the gym.  He notes losing some of his range of motion in his back, but otherwise is doing well with his back pain.   He has CRVO and decreased vision in the left eye.  He has stopped driving at night.   He is due for colon cancer screening and is amenable to colonoscopy referral today.  His daughter, who is in healthcare, is very concerned that he has not had one yet (has done stool based testing).     Patient Active Problem List   Diagnosis Date Noted   Central retinal vein occlusion, left eye 09/18/2015    Priority: High   Psoriasis 12/12/2012    Priority: Medium    Essential hypertension 12/17/2005    Priority: Medium    Spinal stenosis 12/17/2005    Priority: Medium    BPH (benign prostatic hyperplasia) 05/11/2015    Priority: Low   Routine health maintenance 04/02/2011    Priority: Low   Malignant neoplasm of prostate ([place]) 06/08/2018   Overweight (BMI 25.0-29.9) 04/16/2017   HLD (hyperlipidemia) 03/17/2017      Review of Systems  Constitutional:  Negative for chills, fever, malaise/fatigue and weight loss.  Respiratory:  Negative for cough and shortness of breath.   Cardiovascular:  Negative for chest pain, claudication and leg swelling.  Gastrointestinal:  Negative for abdominal pain and blood in stool.  Musculoskeletal:  Positive for back pain and joint pain.  Neurological:  Negative for dizziness, focal weakness and weakness.  Psychiatric/Behavioral:  Negative for depression. The patient is not nervous/anxious.       Objective:     BP 126/70 (BP Location: Left Arm, Patient Position: Sitting, Cuff Size: Large)   Pulse 66   Temp 98.1 F (36.7  C) (Oral)   Ht 6' 1"$  (1.854 m)   Wt 221 lb 8 oz (100.5 kg)   SpO2 98% Comment: RA  BMI 29.22 kg/m  Wt Readings from Last 3 Encounters:  04/17/22 221 lb 8 oz (100.5 kg)  01/16/22 225 lb (102.1 kg)  08/28/21 255 lb 3.2 oz (115.8 kg)      Physical Exam Vitals and nursing note reviewed.  Constitutional:      Appearance: Normal appearance.  HENT:     Head: Normocephalic and atraumatic.  Cardiovascular:     Rate and Rhythm: Normal rate and regular rhythm.     Heart sounds: No murmur heard. Pulmonary:     Effort: Pulmonary effort is normal. No respiratory distress.  Abdominal:     General: Abdomen is flat. There is no distension.     Palpations: Abdomen is soft.  Musculoskeletal:        General: No swelling, tenderness or deformity.     Comments: He has some mild decreased ROM of the lower spine.   Skin:    General: Skin is warm and dry.  Neurological:     General: No focal deficit present.     Mental Status: He is alert and oriented to person, place, and time.  Psychiatric:        Mood and Affect: Mood normal.  Behavior: Behavior normal.       The 10-year ASCVD risk score (Arnett DK, et al., 2019) is: 20.6%    Assessment & Plan:   Problem List Items Addressed This Visit       High   Central retinal vein occlusion, left eye    Currently stable and he is taking precautions with driving.  Follow up as planned with ophthalmology.         Medium    Essential hypertension - Primary (Chronic)    BP today is very good at 126/70.  He is very happy with this number.  He notes that he has started taking his medications at night to avoid fatigue and malaise associated with taking them.    Plan Continue 4 drug regimen of spironolactone, lisinopril, amlodipine and hctz.       Relevant Orders   Lipid Profile   Spinal stenosis    Pain is well controlled with current pain regimen.  He is planning on starting exercising again.  He notes some decreased ROM of the  spine, but on exam this is mostly normal with some very mild limitation.   Plan Restart exercising Discussed PT, if not improving, will consider Back exercises provided to improve ROM      Relevant Medications   HYDROcodone-acetaminophen (NORCO) 10-325 MG tablet     Low   Routine health maintenance    Colonoscopy referral placed       Relevant Orders   Ambulatory referral to Gastroenterology     Unprioritized   HLD (hyperlipidemia)    Lipid profile recheck today  He will likely need to start a statin, which I advised him of today.   Pending LDL repeat.       Overweight (BMI 25.0-29.9)   Relevant Orders   Lipid Profile   Malignant neoplasm of prostate [facility])    He is following with urology, under active treatment.   Next appointment in March.        Return in about 4 months (around 08/16/2022).    Gilles Chiquito, MD

## 2022-04-17 NOTE — Assessment & Plan Note (Signed)
Pain is well controlled with current pain regimen.  He is planning on starting exercising again.  He notes some decreased ROM of the spine, but on exam this is mostly normal with some very mild limitation.   Plan Restart exercising Discussed PT, if not improving, will consider Back exercises provided to improve ROM

## 2022-04-17 NOTE — Assessment & Plan Note (Signed)
Lipid profile recheck today  He will likely need to start a statin, which I advised him of today.   Pending LDL repeat.

## 2022-04-17 NOTE — Progress Notes (Addendum)
-------------  Subjective:   Jeremy Hill is a 77 y.o. male who presents for an Initial Medicare Annual Wellness Visit. I connected with  Jeremy Hill on 04/17/22 by a  Face-To-Face  enabled telemedicine application and verified that I am speaking with the correct person using two identifiers.  Patient Location: Other:  Office/Clinic  Provider Location: Office/Clinic  I discussed the limitations of evaluation and management by telemedicine. The patient expressed understanding and agreed to proceed.  Review of Systems    Defer to PCP       Objective:    Today's Vitals   04/17/22 1124  BP: 126/70  Pulse: 66  Temp: 98.1 F (36.7 C)  TempSrc: Oral  SpO2: 98%  Weight: 221 lb 8 oz (100.5 kg)  Height: 6' 1"$  (1.854 m)   Body mass index is 29.22 kg/m.     04/17/2022   11:26 AM 04/17/2022    9:26 AM 08/28/2021    4:07 PM 08/05/2021    2:26 PM 11/19/2020    2:33 PM 07/08/2020    3:19 PM 06/04/2020   10:36 AM  Advanced Directives  Does Patient Have a Medical Advance Directive? No No No No No Yes Yes  Type of Teacher, early years/pre;Living will [place];Living will  Does patient want to make changes to medical advance directive?      No - Patient declined No - Patient declined  Copy of [place] in Chart?      No - copy requested No - copy requested  Would patient like information on creating a medical advance directive? No - Patient declined No - Patient declined No - Patient declined No - Patient declined No - Patient declined  No - Patient declined    Current Medications (verified) Outpatient Encounter Medications as of 04/17/2022  Medication Sig   amLODipine (NORVASC) 10 MG tablet Take 1 tablet (10 mg total) by mouth daily.   clobetasol ointment (TEMOVATE) 0.05 %    hydrochlorothiazide (HYDRODIURIL) 25 MG tablet Take 1 tablet (25 mg total) by mouth daily.   lisinopril (ZESTRIL) 40 MG tablet Take 1 tablet (40 mg  total) by mouth daily.   spironolactone (ALDACTONE) 25 MG tablet TAKE 1 TABLET(25 MG) BY MOUTH DAILY   No facility-administered encounter medications on file as of 04/17/2022.    Allergies (verified) Oxycodone   History: Past Medical History:  Diagnosis Date   Anemia 2008   borderline low Hg/Hct = 12.6/39.6 (01/10/2007), no anemia panel available and patient had refused colonoscopy, last colonoscopy done was in 2005 and the results were normal, done by Dr. Collene Mares   Depression    Hypertension    Neutropenia 2008   noted on cbc (01/10/2007) WBC = 3.9, also CBC done 08/2006 showed WBC = 3.0, unclear etiology; CXR done 08/2006 showed questionable right lung nodule and the follow up CT was recommended, there is no data in the [place] that it was done   Osteomyelitis ([place]) 12/2001   per MRI of the spine - GIVEN THE ABNORMALITY AT THE T8-9 LEVEL, INFECTION AT THESE LATTER REGIONS CANNOT BE COMPLETELY EXCLUDED AND  WILL NEED TO BE FOLLOWED CLOSELY. SCATTERED DEGENERATIVE CHANGES IN THE LOWER  THORACIC/LUMBAR SPINE  AT THE L4-5 SIGNIFICANT NEURAL FORAMINAL NARROWING (R>L).  DECREASED SIGNAL INTENSITY OF BONE MARROW. UNDERLYING ANEMIA/INFILTRATIVE PROCESS/ LYMPHOMA.    Osteomyelitis, chronic ([place]) 2004   persistant osteomyelitis per MRI 04/2002 and also progressive at  the same level as in 2003 T8-9 level   Prostate cancer [facility])    Past Surgical History:  Procedure Laterality Date   CYSTOSCOPY N/A 10/28/2018   Procedure: CYSTOSCOPY;  Surgeon: Kathie Rhodes, MD;  [facility]: [facility];  Service: Urology;  Laterality: N/A;  No seeds seen per Dr. Karsten Ro   HERNIA REPAIR     LYMPH NODE BIOPSY  01/2002   right inguinal node biopsy (done secondary to finding of neutropenia and lymphadenopathy) -  REACTIVE LYMPHOID HYPERPLASIA WITH SINUS HISTIOCYTOSIS AND PLASMACYTOSIS, no evidence of malignancy   PROSTATE BIOPSY     RADIOACTIVE SEED IMPLANT N/A 10/28/2018   Procedure: RADIOACTIVE SEED  IMPLANT/BRACHYTHERAPY IMPLANT;  Surgeon: Kathie Rhodes, MD;  Location: [place];  Service: Urology;  Laterality: N/A;   RHINOPLASTY     SPACE OAR INSTILLATION N/A 10/28/2018   Procedure: SPACE OAR INSTILLATION;  Surgeon: Kathie Rhodes, MD;  Location: [facility];  Service: Urology;  Laterality: N/A;   Family History  Problem Relation Age of Onset   Heart disease Mother    Heart disease Father    Diabetes Sister    Breast cancer Sister    Suicidality Brother    Prostate cancer Neg Hx    Colon cancer Neg Hx    Pancreatic cancer Neg Hx    Social History   Socioeconomic History   Marital status: Divorced    Spouse name: Not on file   Number of children: 3   Years of education: Not on file   Highest education level: Not on file  Occupational History    Comment: retired  Tobacco Use   Smoking status: Never   Smokeless tobacco: Never  Vaping Use   Vaping Use: Never used  Substance and Sexual Activity   Alcohol use: No    Alcohol/week: 0.0 standard drinks of alcohol   Drug use: No   Sexual activity: Not Currently  Other Topics Concern   Not on file  Social History Narrative   Recently divorced.    Social Determinants of Health   Financial Resource Strain: Low Risk  (04/17/2022)   Overall Financial Resource Strain (CARDIA)    Difficulty of Paying Living Expenses: Not hard at all  Food Insecurity: Food Insecurity Present (04/17/2022)   Hunger Vital Sign    Worried About Running Out of Food in the Last Year: Never true    Ran Out of Food in the Last Year: Sometimes true  Transportation Needs: Unmet Transportation Needs (04/17/2022)   PRAPARE - Hydrologist (Medical): Yes    Lack of Transportation (Non-Medical): No  Physical Activity: Unknown (04/17/2022)   Exercise Vital Sign    Days of Exercise per Week: Patient refused    Minutes of Exercise per Session: Patient refused  Stress: No Stress Concern Present  (04/17/2022)   [place]    Feeling of Stress : Not at all  Social Connections: Moderately Integrated (04/17/2022)   Social Connection and Isolation Panel [NHANES]    Frequency of Communication with Friends and Family: More than three times a week    Frequency of Social Gatherings with Friends and Family: More than three times a week    Attends Religious Services: More than 4 times per year    Active Member of [employer] or Organizations: Yes    Attends Archivist Meetings: Never    Marital Status: Divorced    Tobacco Counseling  Counseling given: Not Answered   Clinical Intake:  Pre-visit preparation completed: Yes  Pain : No/denies pain     Nutritional Risks: None Diabetes: No  How often do you need to have someone help you when you read instructions, pamphlets, or other written materials from your doctor or pharmacy?: 1 - Never What is the last grade level you completed in school?: 12th grade  Diabetic?No  Interpreter Needed?: No  Information entered by :: Jeovani Weisenburger,cma 04/17/22 11:26am   Activities of Daily Living    04/17/2022   11:26 AM 04/17/2022    9:26 AM  In your present state of health, do you have any difficulty performing the following activities:  Hearing? 0 0  Vision? 0 0  Difficulty concentrating or making decisions? 0 0  Walking or climbing stairs? 0 0  Dressing or bathing? 0 0  Doing errands, shopping? 0 0    Patient Care Team: Sid Falcon, MD as PCP - General (Internal Medicine) Cira Rue, RN Nurse Navigator as Registered Nurse (Medical Oncology)  Indicate any recent [employer] you may have received from other than [facility] providers in the past year (date may be approximate).     Assessment:   This is a routine wellness examination for Jeremy Hill.  Hearing/Vision screen No results found.  Dietary issues and exercise activities discussed:     Goals Addressed    None   Depression Screen    04/17/2022   11:26 AM 04/17/2022    9:26 AM 08/05/2021    2:26 PM 12/20/2020   10:48 AM 12/20/2020   10:25 AM 07/08/2020    3:20 PM 06/04/2020   10:29 AM  PHQ 2/9 Scores  PHQ - 2 Score 0 0 0 0 0 0 0  PHQ- 9 Score     0      Fall Risk    04/17/2022   11:26 AM 04/17/2022    9:25 AM 08/19/2021    2:24 PM 08/05/2021    2:25 PM 11/19/2020    2:31 PM  [place] in the past year? 0 0 0 0 0  Number falls in past yr: 0 0 0 0 0  Injury with Fall? 0 0 0 0 0  Risk for fall due to : No Fall Risks No Fall Risks No Fall Risks No Fall Risks No Fall Risks  Follow up Falls evaluation completed;Falls prevention discussed Falls evaluation completed;Falls prevention discussed Falls evaluation completed Falls evaluation completed;Falls prevention discussed Falls evaluation completed    FALL RISK PREVENTION PERTAINING TO THE HOME:  Any stairs in or around the home? Yes  If so, are there any without handrails? No  Home free of loose throw rugs in walkways, pet beds, electrical cords, etc? Yes  Adequate lighting in your home to reduce risk of falls? Yes   ASSISTIVE DEVICES UTILIZED TO PREVENT FALLS:  Life alert? No  Use of a cane, walker or w/c? No  Grab bars in the bathroom? No  Shower chair or bench in shower? No  Elevated toilet seat or a handicapped toilet? No   TIMED UP AND GO:  Was the test performed? Yes .  Length of time to ambulate 10 feet: 1 min .   Gait slow and steady without use of assistive device  Cognitive Function:        04/17/2022   11:26 AM  6CIT Screen  What Year? 0 points  What month? 0 points  What time? 0 points  Count  back from 20 0 points  Months in reverse 0 points  Repeat phrase 0 points  Total Score 0 points    Immunizations Immunization History  Administered Date(s) Administered   Fluad Quad(high Dose 65+) 11/19/2020, 12/11/2021   Influenza Whole 12/10/2008, 01/06/2010   Influenza, Seasonal, Injecte, Preservative  Fre 03/15/2012   Influenza,inj,Quad PF,6+ Mos 12/12/2012, 01/24/2014, 01/23/2015, 02/13/2016, 12/16/2016, 12/29/2017, 12/02/2018   Moderna Sars-Covid-2 Vaccination 04/15/2019, 05/13/2019   Pneumococcal Conjugate-13 01/23/2015   Pneumococcal Polysaccharide-23 06/09/2016   Tdap 03/15/2012    TDAP status: Due, Education has been provided regarding the importance of this vaccine. Advised may receive this vaccine at local pharmacy or Health Dept. Aware to provide a copy of the vaccination record if obtained from local pharmacy or Health Dept. Verbalized acceptance and understanding.  Flu Vaccine status: Up to date  Pneumococcal vaccine status: Up to date  Covid-19 vaccine status: Completed vaccines  Qualifies for Shingles Vaccine? No   Zostavax completed No   Shingrix Completed?: No.    Education has been provided regarding the importance of this vaccine. Patient has been advised to call insurance company to determine out of pocket expense if they have not yet received this vaccine. Advised may also receive vaccine at local pharmacy or Health Dept. Verbalized acceptance and understanding.  Screening Tests Health Maintenance  Topic Date Due   Zoster Vaccines- Shingrix (1 of 2) Never done   COVID-19 Vaccine (3 - Moderna risk series) 06/10/2019   COLONOSCOPY (Pts 45-70yr Insurance coverage will need to be confirmed)  03/10/2020   DTaP/Tdap/Td (2 - Td or Tdap) 03/15/2022   COLON CANCER SCREENING ANNUAL FOBT  08/20/2022   Medicare Annual Wellness (AWV)  04/18/2023   Pneumonia Vaccine 75 Years old  Completed   INFLUENZA VACCINE  Completed   Hepatitis C Screening  Completed   HPV VACCINES  Aged Out    Health Maintenance  Health Maintenance Due  Topic Date Due   Zoster Vaccines- Shingrix (1 of 2) Never done   COVID-19 Vaccine (3 - Moderna risk series) 06/10/2019   COLONOSCOPY (Pts 45-467yrInsurance coverage will need to be confirmed)  03/10/2020   DTaP/Tdap/Td (2 - Td or Tdap)  03/15/2022      Lung Cancer Screening: (Low Dose CT Chest recommended if Age 75-80ears, 30 pack-year currently smoking OR have quit w/in 15years.) does not qualify.   Lung Cancer Screening Referral: N/A  Additional Screening:  Hepatitis C Screening: does not qualify; Completed 12/16/2016  Vision Screening: Recommended annual ophthalmology exams for early detection of glaucoma and other disorders of the eye. Is the patient up to date with their annual eye exam?  Yes  Who is the provider or what is the name of the office in which the patient attends annual eye exams? N/A If pt is not established with a provider, would they like to be referred to a provider to establish care? No .   Dental Screening: Recommended annual dental exams for proper oral hygiene  Community Resource Referral / Chronic Care Management: CRR required this visit?  No   CCM required this visit?  No      Plan:     I have personally reviewed and noted the following in the patient's chart:   Medical and social history Use of alcohol, tobacco or illicit drugs  Current medications and supplements including opioid prescriptions. Patient is currently taking opioid prescriptions. Information provided to patient regarding non-opioid alternatives. Patient advised to discuss non-opioid treatment plan with their provider. Functional  ability and status Nutritional status Physical activity Advanced directives List of other physicians Hospitalizations, surgeries, and ER visits in previous 12 months Vitals Screenings to include cognitive, depression, and falls Referrals and appointments  In addition, I have reviewed and discussed with patient certain preventive protocols, quality metrics, and best practice recommendations. A written personalized care plan for preventive services as well as general preventive health recommendations were provided to patient.     Kerin Perna, [place]   04/17/2022   Nurse Notes:  Face-To-Face visit  Mr. Lei , Thank you for taking time to come for your Medicare Wellness Visit. I appreciate your ongoing commitment to your health goals. Please review the following plan we discussed and let me know if I can assist you in the future.   These are the goals we discussed:  Goals   None     This is a list of the screening recommended for you and due dates:  Health Maintenance  Topic Date Due   Zoster (Shingles) Vaccine (1 of 2) Never done   COVID-19 Vaccine (3 - Moderna risk series) 06/10/2019   Colon Cancer Screening  03/10/2020   DTaP/Tdap/Td vaccine (2 - Td or Tdap) 03/15/2022   Stool Blood Test  08/20/2022   Medicare Annual Wellness Visit  04/18/2023   Pneumonia Vaccine  Completed   Flu Shot  Completed   Hepatitis C Screening: USPSTF Recommendation to screen - Ages 18-79 yo.  Completed   HPV Vaccine  Aged Out

## 2022-04-17 NOTE — Assessment & Plan Note (Signed)
Colonoscopy referral placed

## 2022-04-17 NOTE — Patient Instructions (Signed)
[name] - -  You are doing very well!  Please consider the attached stretching exercises to help with the range of motion of your back.  We are going to check your cholesterol today, if high, we may need to start a medication to help lower it.    Thank you and come back to see me in 4-6 months, sooner if you need.

## 2022-04-17 NOTE — Assessment & Plan Note (Signed)
He is following with urology, under active treatment.   Next appointment in March.

## 2022-04-17 NOTE — Assessment & Plan Note (Signed)
BP today is very good at 126/70.  He is very happy with this number.  He notes that he has started taking his medications at night to avoid fatigue and malaise associated with taking them.    Plan Continue 4 drug regimen of spironolactone, lisinopril, amlodipine and hctz.

## 2022-04-18 LAB — LIPID PANEL
Chol/HDL Ratio: 3.7 ratio (ref 0.0–5.0)
Cholesterol, Total: 183 mg/dL (ref 100–199)
HDL: 50 mg/dL (ref >39)
LDL Chol Calc (NIH): 123 mg/dL — ABNORMAL HIGH (ref 0–99)
Triglycerides: 50 mg/dL (ref 0–149)
VLDL Cholesterol Cal: 10 mg/dL (ref 5–40)

## 2022-04-20 ENCOUNTER — Other Ambulatory Visit (HOSPITAL_COMMUNITY): Payer: Self-pay

## 2022-04-20 ENCOUNTER — Other Ambulatory Visit: Payer: Self-pay | Admitting: Internal Medicine

## 2022-04-20 DIAGNOSIS — M4800 Spinal stenosis, site unspecified: Secondary | ICD-10-CM

## 2022-04-21 ENCOUNTER — Other Ambulatory Visit (HOSPITAL_COMMUNITY): Payer: Self-pay

## 2022-04-24 ENCOUNTER — Other Ambulatory Visit (HOSPITAL_COMMUNITY): Payer: Self-pay

## 2022-04-27 ENCOUNTER — Encounter: Payer: Self-pay | Admitting: *Deleted

## 2022-04-28 MED ORDER — ROSUVASTATIN CALCIUM 20 MG PO TABS: 20.0000 mg | ORAL_TABLET | Freq: Every day | ORAL | 3 refills | Status: DC

## 2022-04-28 NOTE — Addendum Note (Signed)
Addended by: Gilles Chiquito B on: 04/28/2022 10:04 AM   Modules accepted: Orders

## 2022-05-18 ENCOUNTER — Other Ambulatory Visit: Payer: Self-pay | Admitting: Internal Medicine

## 2022-05-18 DIAGNOSIS — M4800 Spinal stenosis, site unspecified: Secondary | ICD-10-CM

## 2022-05-18 MED ORDER — HYDROCODONE-ACETAMINOPHEN 10-325 MG PO TABS: 1.0000 | ORAL_TABLET | Freq: Three times a day (TID) | ORAL | 0 refills | Status: DC | PRN

## 2022-05-18 NOTE — Telephone Encounter (Signed)
HYDROcodone-acetaminophen (NORCO) 10-325 MG tablet    [facility] [phone] - [place], [place] AT [address] (Ph: [phone])

## 2022-06-15 ENCOUNTER — Other Ambulatory Visit: Payer: Self-pay

## 2022-06-15 DIAGNOSIS — M4800 Spinal stenosis, site unspecified: Secondary | ICD-10-CM

## 2022-06-16 MED ORDER — HYDROCODONE-ACETAMINOPHEN 10-325 MG PO TABS: 1.0000 | ORAL_TABLET | Freq: Three times a day (TID) | ORAL | 0 refills | Status: DC | PRN

## 2022-07-16 ENCOUNTER — Other Ambulatory Visit: Payer: Self-pay

## 2022-07-16 DIAGNOSIS — M4800 Spinal stenosis, site unspecified: Secondary | ICD-10-CM

## 2022-07-16 MED ORDER — HYDROCODONE-ACETAMINOPHEN 10-325 MG PO TABS
1.0000 | ORAL_TABLET | Freq: Three times a day (TID) | ORAL | 0 refills | Status: DC | PRN
Start: 2022-07-16 — End: 2022-08-17

## 2022-08-14 ENCOUNTER — Encounter: Payer: Self-pay | Admitting: Student

## 2022-08-14 ENCOUNTER — Ambulatory Visit (INDEPENDENT_AMBULATORY_CARE_PROVIDER_SITE_OTHER): Payer: Medicare PPO | Admitting: Student

## 2022-08-14 ENCOUNTER — Other Ambulatory Visit: Payer: Self-pay

## 2022-08-14 DIAGNOSIS — I10 Essential (primary) hypertension: Secondary | ICD-10-CM

## 2022-08-14 DIAGNOSIS — M25522 Pain in left elbow: Secondary | ICD-10-CM | POA: Diagnosis not present

## 2022-08-14 DIAGNOSIS — Z114 Encounter for screening for human immunodeficiency virus [HIV]: Secondary | ICD-10-CM | POA: Diagnosis not present

## 2022-08-14 DIAGNOSIS — N342 Other urethritis: Secondary | ICD-10-CM | POA: Diagnosis not present

## 2022-08-14 DIAGNOSIS — R369 Urethral discharge, unspecified: Secondary | ICD-10-CM | POA: Diagnosis not present

## 2022-08-14 DIAGNOSIS — A539 Syphilis, unspecified: Secondary | ICD-10-CM | POA: Insufficient documentation

## 2022-08-14 DIAGNOSIS — Z7251 High risk heterosexual behavior: Secondary | ICD-10-CM | POA: Diagnosis not present

## 2022-08-14 NOTE — Patient Instructions (Signed)
Thank you, Mr.Caitlin D Karasik for allowing [place] to provide your care today. Today we discussed your elbow pain - I am glad has gotten better. I would like you to come back if there is significant swelling so we can test the fluid for different diseases.  For your penile discharge, I ordered the labs below., I will call you if they are abnormal.  I have ordered the following labs for you:   Lab Orders         BMP8+Anion Gap         RPR         HIV antibody (with reflex)         Urinalysis, Complete (81001)      I will call if any are abnormal. All of your labs can be accessed through "My Chart".   My Chart Access: [email or web address]  Please follow-up in: if symptoms are not better or swelling returns    We look forward to seeing you next time. Please call our clinic at [phone] if you have any questions or concerns. The best time to call is Monday-Friday from 9am-4pm, but there is someone available 24/7. If after hours or the weekend, call the main hospital number and ask for the Internal Medicine Resident On-Call. If you need medication refills, please notify your pharmacy one week in advance and they will send [place] a request.   Thank you for letting us take part in your care. Wishing you the best!  Morene Crocker, MD 08/14/2022, 10:45 AM [facility] Internal Medicine Resident, PGY-1

## 2022-08-14 NOTE — Assessment & Plan Note (Addendum)
Patient reports that ~1 week ago he noted a white/yellow penile discharge not associated with dysuria, suprapubic tenderness, penile tenderness, or new sores in his pelvic region. He is sexually active with a woman and it is not sure if they are monogamous. He intermittently uses barrier protection.  GU exam with chaperone : Non circumcised penis with normal appearance without tenderness or urethral discharge. 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 Plan: - STI testing today - U/A test today

## 2022-08-14 NOTE — Assessment & Plan Note (Addendum)
Patient presents with 3 day history of L elbow pain with mild sweeling that has now resolved and a history of L thumb pain with without swelling that has since resolved. Patient denies recent illness or systemic infections. He also denies changes to his medications or history of gout, though takes hydrochlorothiazide for HTN control.  Physical exam specifically of the L elbow reveals No tenderness to palpation over lateral or medial epincondyles and olecranon process. NO edema over olecranon bursa or L thum area. Full flexion and extension at the elbow and normal supination and pronation passive and with resistance without pain. 2+ RD pulses bilaterally. 5/5 LUE strength, including grip strength. - Spurling test.   Ddx include aseptc bursitis, acute monoartritis (gout, CPPD, vs septic bursitis/arthritis. Examination today without edema or joint fluid that could be tapped for further evaluation. Will obtain BMP today as patient's last blood test. Had an elevated Cr.  From baseline and , in the setting of hydrochlorothiazide therapy could have precipitated a gout episode. Though, it rare for it to self resolve in 2 days.   - Advised patient to return to clinic if there is worsening edema to allow for join arthrocentesis/bursa fluid removal - Conservative management if pain returns - BMP today

## 2022-08-14 NOTE — Progress Notes (Signed)
Subjective:  CC: L elbow pain  HPI:  [name] is a 77 y.o. male with a past medical history stated below and presents today for L elbow pain and urethral discharge. Please see problem based assessment and plan for additional details.  Past Medical History:  Diagnosis Date   Anemia 2008   borderline low Hg/Hct = 12.6/39.6 (01/10/2007), no anemia panel available and patient had refused colonoscopy, last colonoscopy done was in 2005 and the results were normal, done by Dr. Loreta Ave   Depression    Hypertension    Neutropenia 2008   noted on cbc (01/10/2007) WBC = 3.9, also CBC done 08/2006 showed WBC = 3.0, unclear etiology; CXR done 08/2006 showed questionable right lung nodule and the follow up CT was recommended, there is no data in the EChart that it was done   Osteomyelitis (HCC) 12/2001   per MRI of the spine - GIVEN THE ABNORMALITY AT THE T8-9 LEVEL, INFECTION AT THESE LATTER REGIONS CANNOT BE COMPLETELY EXCLUDED AND  WILL NEED TO BE FOLLOWED CLOSELY. SCATTERED DEGENERATIVE CHANGES IN THE LOWER  THORACIC/LUMBAR SPINE  AT THE L4-5 SIGNIFICANT NEURAL FORAMINAL NARROWING (R>L).  DECREASED SIGNAL INTENSITY OF BONE MARROW. UNDERLYING ANEMIA/INFILTRATIVE PROCESS/ LYMPHOMA.    Osteomyelitis, chronic (HCC) 2004   persistant osteomyelitis per MRI 04/2002 and also progressive at the same level as in 2003 T8-9 level   Prostate cancer [facility])     Current Outpatient Medications on File Prior to Visit  Medication Sig Dispense Refill   amLODipine (NORVASC) 10 MG tablet Take 1 tablet (10 mg total) by mouth daily. 90 tablet 3   clobetasol ointment (TEMOVATE) 0.05 %      hydrochlorothiazide (HYDRODIURIL) 25 MG tablet Take 1 tablet (25 mg total) by mouth daily. 90 tablet 3   HYDROcodone-acetaminophen (NORCO) 10-325 MG tablet Take 1 tablet by mouth every 8 (eight) hours as needed for severe pain. 90 tablet 0   lisinopril (ZESTRIL) 40 MG tablet Take 1 tablet (40 mg total) by mouth daily. 90 tablet  3   rosuvastatin (CRESTOR) 20 MG tablet Take 1 tablet (20 mg total) by mouth daily. 90 tablet 3   spironolactone (ALDACTONE) 25 MG tablet TAKE 1 TABLET(25 MG) BY MOUTH DAILY 90 tablet 3   No current facility-administered medications on file prior to visit.    Family History  Problem Relation Age of Onset   Heart disease Mother    Heart disease Father    Diabetes Sister    Breast cancer Sister    Suicidality Brother    Prostate cancer Neg Hx    Colon cancer Neg Hx    Pancreatic cancer Neg Hx     Social History   Socioeconomic History   Marital status: Divorced    Spouse name: Not on file   Number of children: 3   Years of education: Not on file   Highest education level: Not on file  Occupational History    Comment: retired  Tobacco Use   Smoking status: Never   Smokeless tobacco: Never  Vaping Use   Vaping Use: Never used  Substance and Sexual Activity   Alcohol use: No    Alcohol/week: 0.0 standard drinks of alcohol   Drug use: No   Sexual activity: Not Currently  Other Topics Concern   Not on file  Social History Narrative   Recently divorced.    Social Determinants of Health   Financial Resource Strain: Low Risk  (04/17/2022)   Overall Financial  Resource Strain (CARDIA)    Difficulty of Paying Living Expenses: Not hard at all  Food Insecurity: Food Insecurity Present (04/17/2022)   Hunger Vital Sign    Worried About Running Out of Food in the Last Year: Never true    Ran Out of Food in the Last Year: Sometimes true  Transportation Needs: Unmet Transportation Needs (04/17/2022)   PRAPARE - Administrator, Civil Service (Medical): Yes    Lack of Transportation (Non-Medical): No  Physical Activity: Patient Declined (04/17/2022)   Exercise Vital Sign    Days of Exercise per Week: Patient declined    Minutes of Exercise per Session: Patient declined  Stress: No Stress Concern Present (04/17/2022)   [employer] of Occupational Health -  Occupational Stress Questionnaire    Feeling of Stress : Not at all  Social Connections: Moderately Integrated (04/17/2022)   Social Connection and Isolation Panel [NHANES]    Frequency of Communication with Friends and Family: More than three times a week    Frequency of Social Gatherings with Friends and Family: More than three times a week    Attends Religious Services: More than 4 times per year    Active Member of [employer] or Organizations: Yes    Attends Banker Meetings: Never    Marital Status: Divorced  Catering manager Violence: Not At Risk (04/17/2022)   Humiliation, Afraid, Rape, and Kick questionnaire    Fear of Current or Ex-Partner: No    Emotionally Abused: No    Physically Abused: No    Sexually Abused: No    Review of Systems: ROS negative except for what is noted on the assessment and plan.  Objective:  There were no vitals filed for this visit.  Physical Exam: Constitutional: well-appearing male sitting in exam chair, in no acute distress HENT: normocephalic atraumatic, mucous membranes moist Eyes: conjunctiva non-erythematous Neck: supple Cardiovascular: regular rate and rhythm, no m/r/g Pulmonary/Chest: normal work of breathing on room air, lungs clear to auscultation bilaterally Abdominal: soft, non-tender, non-distended MSK: normal bulk and tone L elbow examination No tenderness to palpation over lateral or medial epicondyles and olecranon process. NO edema over olecranon bursa or L thumb area. Full flexion and extension at the elbow and normal supination and pronation passive and with resistance without pain. 2+ RD pulses bilaterally. 5/5 LUE strength, including grip strength. - Spurling test.  GU exam with chaperone : Non circumcised penis with normal appearance without tenderness or urethral discharge. 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 Neurological: alert & oriented x 3, 5/5 strength in bilateral upper and lower  extremities, normal gait Skin: warm and dry Psych: Pleasant mood and affect       08/14/2022   10:20 AM  Depression screen PHQ 2/9  Decreased Interest 0  Down, Depressed, Hopeless 0  PHQ - 2 Score 0  Altered sleeping 0  Tired, decreased energy 0  Change in appetite 0  Feeling bad or failure about yourself  0  Trouble concentrating 0  Moving slowly or fidgety/restless 0  Suicidal thoughts 0  PHQ-9 Score 0  Difficult doing work/chores Not difficult at all        No data to display           Assessment & Plan:   Left elbow pain Patient presents with 3 day history of L elbow pain with mild sweeling that has now resolved and a history of L thumb pain with without swelling that has since resolved.  Patient denies recent illness or systemic infections. He also denies changes to his medications or history of gout, though takes hydrochlorothiazide for HTN control.  Physical exam specifically of the L elbow reveals No tenderness to palpation over lateral or medial epincondyles and olecranon process. NO edema over olecranon bursa or L thum area. Full flexion and extension at the elbow and normal supination and pronation passive and with resistance without pain. 2+ RD pulses bilaterally. 5/5 LUE strength, including grip strength. - Spurling test.   Ddx include aseptc bursitis, acute monoartritis (gout, CPPD, vs septic bursitis/arthritis. Examination today without edema or joint fluid that could be tapped for further evaluation. Will obtain BMP today as patient's last blood test. Had an elevated Cr.  From baseline and , in the setting of hydrochlorothiazide therapy could have precipitated a gout episode. Though, it rare for it to self resolve in 2 days.   - Advised patient to return to clinic if there is worsening edema to allow for join arthrocentesis/bursa fluid removal - Conservative management if pain returns - BMP today  Urethral discharge Patient reports that ~1 week ago he noted a  white/yellow penile discharge not associated with dysuria, suprapubic tenderness, penile tenderness, or new sores in his pelvic region. He is sexually active with a woman and it is not sure if they are monogamous. He intermittently uses barrier protection.  GU exam with chaperone : Non circumcised penis with normal appearance without tenderness or urethral discharge. 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 Plan: - STI testing today - U/A test today    Return if symptoms worsen or fail to improve.  Patient discussed with Dr. Allyn Kenner, MD [facility] Internal Medicine Program - PGY-1 08/14/2022, 3:56 PM

## 2022-08-15 LAB — URINALYSIS, COMPLETE
Glucose, UA: NEGATIVE
Nitrite, UA: POSITIVE — AB
RBC, UA: NEGATIVE
Specific Gravity, UA: 1.027 (ref 1.005–1.030)
Urobilinogen, Ur: 1 mg/dL (ref 0.2–1.0)
pH, UA: 7.5 (ref 5.0–7.5)

## 2022-08-15 LAB — MICROSCOPIC EXAMINATION
Casts: NONE SEEN /lpf
RBC, Urine: NONE SEEN /hpf (ref 0–2)

## 2022-08-15 LAB — BMP8+ANION GAP
BUN/Creatinine Ratio: 13 (ref 10–24)
CO2: 23 mmol/L (ref 20–29)
eGFR: 47 mL/min/{1.73_m2} — ABNORMAL LOW (ref >59)

## 2022-08-15 LAB — RPR, QUANT+TP ABS (REFLEX): Rapid Plasma Reagin, Quant: 1:2 {titer} — ABNORMAL HIGH

## 2022-08-15 LAB — RPR: RPR Ser Ql: REACTIVE — AB

## 2022-08-16 LAB — HIV ANTIBODY (ROUTINE TESTING W REFLEX): HIV Screen 4th Generation wRfx: NONREACTIVE

## 2022-08-16 LAB — BMP8+ANION GAP
Anion Gap: 14 mmol/L (ref 10.0–18.0)
BUN: 20 mg/dL (ref 8–27)
Calcium: 9.4 mg/dL (ref 8.6–10.2)
Chloride: 101 mmol/L (ref 96–106)
Creatinine, Ser: 1.53 mg/dL — ABNORMAL HIGH (ref 0.76–1.27)
Glucose: 129 mg/dL — ABNORMAL HIGH (ref 70–99)
Potassium: 4.5 mmol/L (ref 3.5–5.2)
Sodium: 138 mmol/L (ref 134–144)

## 2022-08-16 LAB — RPR, QUANT+TP ABS (REFLEX): T Pallidum Abs: REACTIVE — AB

## 2022-08-17 ENCOUNTER — Other Ambulatory Visit: Payer: Self-pay

## 2022-08-17 ENCOUNTER — Telehealth: Payer: Self-pay

## 2022-08-17 DIAGNOSIS — M4800 Spinal stenosis, site unspecified: Secondary | ICD-10-CM

## 2022-08-17 MED ORDER — CIPROFLOXACIN HCL 250 MG PO TABS
250.0000 mg | ORAL_TABLET | Freq: Two times a day (BID) | ORAL | 0 refills | Status: AC
Start: 2022-08-17 — End: 2022-08-22

## 2022-08-17 NOTE — Addendum Note (Signed)
Addended by: Daiva Eves, Byrd Hesselbach on: 08/17/2022 05:01 PM   Modules accepted: Orders

## 2022-08-17 NOTE — Progress Notes (Signed)
Internal Medicine Clinic Attending  Case discussed with Dr. Gomez-Caraballo  At the time of the visit.  We reviewed the resident's history and exam and pertinent patient test results.  I agree with the assessment, diagnosis, and plan of care documented in the resident's note.  

## 2022-08-17 NOTE — Telephone Encounter (Signed)
Patient called he is requesting a call back regarding his lab results you can reach patient [phone]

## 2022-08-17 NOTE — Progress Notes (Addendum)
HIV negative.   Regarding U/A + for UTI and  triple phosphate crystals likely in the setting of ammonia producing organisms. No hematuria or casts seen.  No culture was sent. Cr 1.54 and CrCl 59. Will treat for 5 days with Cipro 250 mg BID. Counseled patient on side effects such as tendon rupture, peripheral neuropathy, and c diff. Knows to call the clinic or present to UC if worsening symptoms or systemic signs of infection.  - Patient will pick up medication from [facility] today.  Discussed results with patient. RPR serum positive with low titer. However, T pallidum antibody postive. Patient has not tested positive or been treated for syphilis in the past. Because of this and =T pallidub AB reactivity, syphilis infection is likely.  Patient will need a one time dose of Penicillin G 2.4 million units. Attempted to get this through [facility] but unable to get. Will need clinic administration. - Patient will call clinic for Penicillin G treatment   G/C test was ordered but the sample was unfortunately not collected.   I will place a future order for both urinary G/C to be collected at [facility] clinic. Patient knows to come to clinic for lab only visit. Patient has a follow up appointment with his PCP on 6/28; please confirm testing is done then.  Morene Crocker, MD 5:33 PM 08/17/22

## 2022-08-17 NOTE — Addendum Note (Signed)
Addended by: Morene Crocker on: 08/17/2022 05:33 PM   Modules accepted: Orders

## 2022-08-18 ENCOUNTER — Encounter: Payer: Medicare PPO | Admitting: Internal Medicine

## 2022-08-18 MED ORDER — HYDROCODONE-ACETAMINOPHEN 10-325 MG PO TABS
1.0000 | ORAL_TABLET | Freq: Three times a day (TID) | ORAL | 0 refills | Status: DC | PRN
Start: 2022-08-18 — End: 2022-09-14

## 2022-08-21 ENCOUNTER — Encounter: Payer: Medicare PPO | Admitting: Internal Medicine

## 2022-08-24 NOTE — Progress Notes (Signed)
Patient called by Dr. Daiva Eves. Please see addendum to Dr. Lorenza Chick 08/14/2022 clinic note.

## 2022-08-28 ENCOUNTER — Other Ambulatory Visit (HOSPITAL_COMMUNITY)
Admission: RE | Admit: 2022-08-28 | Discharge: 2022-08-28 | Disposition: A | Discharge status: Home / Self Care | Payer: Medicare PPO | Source: Ambulatory Visit | Attending: Internal Medicine | Admitting: Internal Medicine

## 2022-08-28 ENCOUNTER — Other Ambulatory Visit: Payer: Self-pay

## 2022-08-28 ENCOUNTER — Ambulatory Visit (INDEPENDENT_AMBULATORY_CARE_PROVIDER_SITE_OTHER): Payer: Medicare PPO | Admitting: Internal Medicine

## 2022-08-28 VITALS — BP 108/78 | HR 74 | Temp 98.1°F | Ht 73.0 in | Wt 212.4 lb

## 2022-08-28 DIAGNOSIS — M25522 Pain in left elbow: Secondary | ICD-10-CM

## 2022-08-28 DIAGNOSIS — I10 Essential (primary) hypertension: Secondary | ICD-10-CM

## 2022-08-28 DIAGNOSIS — E7800 Pure hypercholesterolemia, unspecified: Secondary | ICD-10-CM

## 2022-08-28 DIAGNOSIS — L409 Psoriasis, unspecified: Secondary | ICD-10-CM | POA: Diagnosis not present

## 2022-08-28 DIAGNOSIS — A539 Syphilis, unspecified: Secondary | ICD-10-CM | POA: Diagnosis not present

## 2022-08-28 DIAGNOSIS — Z0000 Encounter for general adult medical examination without abnormal findings: Secondary | ICD-10-CM

## 2022-08-28 MED ORDER — ZOSTER VAC RECOMB ADJUVANTED 50 MCG/0.5ML IM SUSR: 0.5000 mL | Freq: Once | INTRAMUSCULAR | 0 refills | Status: AC

## 2022-08-28 MED ORDER — PENICILLIN G BENZATHINE 2400000 UNIT/4ML IM SUSY
2.4000 10*6.[IU] | PREFILLED_SYRINGE | Freq: Once | INTRAMUSCULAR | Status: AC
Start: 2022-08-28 — End: 2022-08-28
  Administered 2022-08-28: 2400000 [IU] via INTRAMUSCULAR

## 2022-08-28 NOTE — Assessment & Plan Note (Signed)
Does not remember names of medications, but recalls he is taking 4 blood pressure medications (spironolactone, lisinopril, amlodipine, hydrochlorothiazide). BP today is 108/78 and patient relays satisfaction with BP control with provider help.   Will continue to monitor.

## 2022-08-28 NOTE — Assessment & Plan Note (Signed)
He continues to have elbow pain, but associates this with his syphilis diagnosis.   Will continue to monitor after syphilis treatment.

## 2022-08-28 NOTE — Assessment & Plan Note (Addendum)
Shingles: Shingrix injection given today Colon cancer: FOBT cards today Last serum creatinine abnormal 2 weeks ago 1.53, will recheck today with CMP

## 2022-08-28 NOTE — Assessment & Plan Note (Signed)
At visit 08/14/22, RPR serum positive with low titer. At this visit he reported he is sexually active with a woman and it is not sure if they are monogamous. He intermittently uses barrier protection. He is receiving family support during this time after receiving this diagnosis. His daughter who lives in [place] came to visit him after receiving this news and to aid in his healthcare. He reports possible loneliness, and his daughter has arranged from him to get a cat.   Penicillin G Benzathine injection 2.4 million units given today and urine G/C collected.

## 2022-08-28 NOTE — Patient Instructions (Addendum)
Hi Jeremy Hill,   It was wonderful speaking with you today. We have sent your urine sample to the lab and will call you with the results. We will also be giving you a stool card today to complete at home and send in the mail. Feel free to call us if you need anything, and thank you for entrusting [place] in your care.   Thank you!

## 2022-08-28 NOTE — Progress Notes (Unsigned)
This is a Psychologist, occupational Note.  The care of the patient was discussed with Dr. Criselda Peaches and the assessment and plan was formulated with their assistance.  Please see their note for official documentation of the patient encounter.   Subjective:   Patient ID: Jeremy Hill male   DOB: Sep 12, 1945 77 y.o.   MRN: [id number]  HPI: SABASTIAN Hill is a 77 y.o. with a PMH of BPH, malignant neoplasm of prostate, psoriasis, HTN, CRVO, HLD, and syphilis diagnosis who presents today for a follow up. At his last visit RPR serum positive with low titer and he is here today for a one time dose of penicillin G 2.4 million units. G/C also collected through urine today.   Patient relays that he has been receiving family support from his children during this time, and that his daughter flew from [place] to come see him after receiving this news.   For the details of today's visit, please refer to the assessment and plan.   Current Outpatient Medications  Medication Sig Dispense Refill   Zoster Vaccine Adjuvanted [facility]) injection Inject 0.5 mLs into the muscle once for 1 dose. 0.5 mL 0   amLODipine (NORVASC) 10 MG tablet Take 1 tablet (10 mg total) by mouth daily. 90 tablet 3   clobetasol ointment (TEMOVATE) 0.05 %      hydrochlorothiazide (HYDRODIURIL) 25 MG tablet Take 1 tablet (25 mg total) by mouth daily. 90 tablet 3   HYDROcodone-acetaminophen (NORCO) 10-325 MG tablet Take 1 tablet by mouth every 8 (eight) hours as needed for severe pain. 90 tablet 0   lisinopril (ZESTRIL) 40 MG tablet Take 1 tablet (40 mg total) by mouth daily. 90 tablet 3   rosuvastatin (CRESTOR) 20 MG tablet Take 1 tablet (20 mg total) by mouth daily. 90 tablet 3   spironolactone (ALDACTONE) 25 MG tablet TAKE 1 TABLET(25 MG) BY MOUTH DAILY 90 tablet 3   Current Facility-Administered Medications  Medication Dose Route Frequency Provider Last Rate Last Admin   Penicillin G Benzathine SUSY 2,400,000 Units  2.4 Million Units  Intramuscular Once Inez Catalina, MD        Review of Systems: Pertinent items are noted in HPI. Objective:  Physical Exam: Vitals:   08/28/22 0830  BP: 108/78  Pulse: 74  Temp: 98.1 F (36.7 C)  TempSrc: Oral  SpO2: 100%  Weight: 212 lb 6.4 oz (96.3 kg)  Height: 6\' 1"  (1.854 m)   Constitutional: NAD, appears comfortable. Well dressed.  Cardiovascular: RRR, I did not auscultate murmurs over the aortic or pulmonic posts.  Pulmonary/Chest: CTAB, I heard normal breath sounds.   Extremities: Dark spots observed on both hands.  Psychiatric: Normal mood and affect.  Assessment & Plan:   Essential hypertension Does not remember names of medications, but recalls he is taking 4 blood pressure medications (spironolactone, lisinopril, amlodipine, hydrochlorothiazide). BP today is 108/78 and patient relays satisfaction with BP control with provider help.   Will continue to monitor.   Routine health maintenance Shingles: Shingrix injection given today Colon cancer: FOBT cards today Last serum creatinine abnormal 2 weeks ago 1.53, will recheck today with CMP  HLD (hyperlipidemia) Does not remember name, but suspects is taking rosuvastatin.   Will recheck lipids today.   Left elbow pain He continues to have elbow pain, but associates this with his syphilis diagnosis.   Will continue to monitor after syphilis treatment.   Psoriasis Has noticed rash on arms and hands, associated with syphilis diagnosis.  Will continue to monitor after syphilis treatment.   Syphilis in male At visit 08/14/22, RPR serum positive with low titer. At this visit he reported he is sexually active with a woman and it is not sure if they are monogamous. He intermittently uses barrier protection. He is receiving family support during this time after receiving this diagnosis. His daughter who lives in [place] came to visit him after receiving this news and to aid in his healthcare. He reports possible  loneliness, and his daughter has arranged from him to get a cat.   Penicillin G Benzathine injection 2.4 million units given today and urine G/C collected.

## 2022-08-28 NOTE — Assessment & Plan Note (Signed)
Has noticed rash on arms and hands, associated with syphilis diagnosis.   Will continue to monitor after syphilis treatment.

## 2022-08-28 NOTE — Assessment & Plan Note (Signed)
Does not remember name, but suspects is taking rosuvastatin.   Will recheck lipids today.

## 2022-08-30 LAB — CMP14 + ANION GAP
ALT: 24 IU/L (ref 0–44)
AST: 27 IU/L (ref 0–40)
Albumin: 4.2 g/dL (ref 3.8–4.8)
Alkaline Phosphatase: 88 IU/L (ref 44–121)
Anion Gap: 12 mmol/L (ref 10.0–18.0)
BUN/Creatinine Ratio: 18 (ref 10–24)
BUN: 24 mg/dL (ref 8–27)
Bilirubin Total: 0.4 mg/dL (ref 0.0–1.2)
CO2: 25 mmol/L (ref 20–29)
Calcium: 9.6 mg/dL (ref 8.6–10.2)
Chloride: 97 mmol/L (ref 96–106)
Creatinine, Ser: 1.35 mg/dL — ABNORMAL HIGH (ref 0.76–1.27)
Globulin, Total: 3.6 g/dL (ref 1.5–4.5)
Glucose: 122 mg/dL — ABNORMAL HIGH (ref 70–99)
Potassium: 4.2 mmol/L (ref 3.5–5.2)
Sodium: 134 mmol/L (ref 134–144)
Total Protein: 7.8 g/dL (ref 6.0–8.5)
eGFR: 55 mL/min/{1.73_m2} — ABNORMAL LOW (ref >59)

## 2022-08-30 LAB — LIPID PANEL
Chol/HDL Ratio: 2.7 ratio (ref 0.0–5.0)
Cholesterol, Total: 96 mg/dL — ABNORMAL LOW (ref 100–199)
HDL: 35 mg/dL — ABNORMAL LOW (ref >39)
LDL Chol Calc (NIH): 47 mg/dL (ref 0–99)
Triglycerides: 60 mg/dL (ref 0–149)
VLDL Cholesterol Cal: 14 mg/dL (ref 5–40)

## 2022-08-31 LAB — URINE CYTOLOGY ANCILLARY ONLY
Chlamydia: NEGATIVE
Comment: NEGATIVE
Comment: NORMAL
Neisseria Gonorrhea: NEGATIVE

## 2022-09-01 NOTE — Progress Notes (Signed)
Attestation for Student Documentation:  I personally was present and re-performed the history, physical exam and medical decision-making activities of this service and have verified that the service and findings are accurately documented in the student's note.  Please note patient was given an Rx for Shingrix today as we do not keep these in clinic.   For his syphilis diagnosis, he was treated with penicillin G today X 1 dose.  He should be checked for treponemal Ab at 6 months and 12 months for down trending titer to assess for eradication.    Inez Catalina, MD 09/01/2022, 3:46 PM

## 2022-09-14 ENCOUNTER — Other Ambulatory Visit: Payer: Self-pay | Admitting: Internal Medicine

## 2022-09-14 ENCOUNTER — Other Ambulatory Visit: Payer: Medicare HMO

## 2022-09-14 DIAGNOSIS — M4800 Spinal stenosis, site unspecified: Secondary | ICD-10-CM

## 2022-09-14 DIAGNOSIS — Z0000 Encounter for general adult medical examination without abnormal findings: Secondary | ICD-10-CM

## 2022-09-14 NOTE — Telephone Encounter (Signed)
MED REFILL REQUEST  HYDROcodone-acetaminophen (NORCO) 10-325 MG tablet  [phone] - [place], [place] AT [place] Phone:  [phone]  Fax:  [phone]

## 2022-09-14 NOTE — Telephone Encounter (Signed)
Last rx written - 08/18/22. Last OV - 08/28/22. Next OV - has not been scheduled. TOX - 06/04/20.

## 2022-09-15 MED ORDER — HYDROCODONE-ACETAMINOPHEN 10-325 MG PO TABS
1.0000 | ORAL_TABLET | Freq: Three times a day (TID) | ORAL | 0 refills | Status: DC | PRN
Start: 2022-09-15 — End: 2022-10-14

## 2022-09-16 ENCOUNTER — Other Ambulatory Visit: Payer: Self-pay

## 2022-09-16 DIAGNOSIS — M4800 Spinal stenosis, site unspecified: Secondary | ICD-10-CM

## 2022-09-16 LAB — FECAL OCCULT BLOOD, IMMUNOCHEMICAL: Fecal Occult Bld: NEGATIVE

## 2022-09-16 NOTE — Telephone Encounter (Signed)
Hydrocodone was refilled yesterday. Triage is unable to delete/refuse request.

## 2022-09-21 ENCOUNTER — Encounter: Payer: Self-pay | Admitting: Internal Medicine

## 2022-10-14 ENCOUNTER — Other Ambulatory Visit: Payer: Self-pay

## 2022-10-14 DIAGNOSIS — M4800 Spinal stenosis, site unspecified: Secondary | ICD-10-CM

## 2022-10-15 MED ORDER — HYDROCODONE-ACETAMINOPHEN 10-325 MG PO TABS
1.0000 | ORAL_TABLET | Freq: Three times a day (TID) | ORAL | 0 refills | Status: DC | PRN
Start: 2022-10-15 — End: 2022-11-18

## 2022-11-12 ENCOUNTER — Other Ambulatory Visit: Payer: Self-pay | Admitting: Internal Medicine

## 2022-11-12 DIAGNOSIS — I10 Essential (primary) hypertension: Secondary | ICD-10-CM

## 2022-11-13 NOTE — Telephone Encounter (Signed)
Next appt scheduled 9/18 with PCP.

## 2022-11-18 ENCOUNTER — Other Ambulatory Visit: Payer: Self-pay | Admitting: *Deleted

## 2022-11-18 ENCOUNTER — Encounter: Payer: Medicare HMO | Admitting: Internal Medicine

## 2022-11-18 DIAGNOSIS — M4800 Spinal stenosis, site unspecified: Secondary | ICD-10-CM

## 2022-11-18 MED ORDER — HYDROCODONE-ACETAMINOPHEN 10-325 MG PO TABS: 1.0000 | ORAL_TABLET | Freq: Three times a day (TID) | ORAL | 0 refills | Status: DC | PRN

## 2022-11-18 NOTE — Telephone Encounter (Signed)
Last rx written -10/15/22. Last OV -08/28/22. Next OV -01/15/23. TOX - 06/04/20.

## 2022-11-26 ENCOUNTER — Encounter: Payer: Self-pay | Admitting: Internal Medicine

## 2022-12-14 ENCOUNTER — Other Ambulatory Visit: Payer: Self-pay | Admitting: Internal Medicine

## 2022-12-14 DIAGNOSIS — M4800 Spinal stenosis, site unspecified: Secondary | ICD-10-CM

## 2022-12-14 NOTE — Telephone Encounter (Signed)
Last rx written - 11/18/22. Last OV - 08/28/22. Next OV - 01/15/23 with PCP. TOX - 06/04/20.

## 2022-12-14 NOTE — Telephone Encounter (Signed)
HYDROcodone-acetaminophen (NORCO) 10-325 MG tablet   [id number] - [place], [place] - [address]

## 2022-12-15 MED ORDER — HYDROCODONE-ACETAMINOPHEN 10-325 MG PO TABS: 1.0000 | ORAL_TABLET | Freq: Three times a day (TID) | ORAL | 0 refills | Status: DC | PRN

## 2022-12-30 ENCOUNTER — Encounter: Payer: Self-pay | Admitting: *Deleted

## 2023-01-13 ENCOUNTER — Encounter: Payer: Self-pay | Admitting: Internal Medicine

## 2023-01-13 ENCOUNTER — Ambulatory Visit: Payer: Medicare HMO | Admitting: Internal Medicine

## 2023-01-13 ENCOUNTER — Other Ambulatory Visit: Payer: Self-pay

## 2023-01-13 VITALS — BP 117/75 | HR 73 | Temp 98.2°F | Ht 73.0 in | Wt 217.0 lb

## 2023-01-13 DIAGNOSIS — I10 Essential (primary) hypertension: Secondary | ICD-10-CM | POA: Diagnosis not present

## 2023-01-13 DIAGNOSIS — A539 Syphilis, unspecified: Secondary | ICD-10-CM

## 2023-01-13 DIAGNOSIS — L409 Psoriasis, unspecified: Secondary | ICD-10-CM

## 2023-01-13 DIAGNOSIS — N400 Enlarged prostate without lower urinary tract symptoms: Secondary | ICD-10-CM

## 2023-01-13 DIAGNOSIS — C61 Malignant neoplasm of prostate: Secondary | ICD-10-CM

## 2023-01-13 DIAGNOSIS — E785 Hyperlipidemia, unspecified: Secondary | ICD-10-CM

## 2023-01-13 DIAGNOSIS — M4800 Spinal stenosis, site unspecified: Secondary | ICD-10-CM

## 2023-01-13 DIAGNOSIS — E7800 Pure hypercholesterolemia, unspecified: Secondary | ICD-10-CM

## 2023-01-13 MED ORDER — LISINOPRIL 40 MG PO TABS
40.0000 mg | ORAL_TABLET | Freq: Every day | ORAL | 3 refills | Status: DC
Start: 2023-01-13 — End: 2023-07-02

## 2023-01-13 MED ORDER — HYDROCODONE-ACETAMINOPHEN 10-325 MG PO TABS: 1.0000 | ORAL_TABLET | Freq: Three times a day (TID) | ORAL | 0 refills | Status: DC | PRN

## 2023-01-13 NOTE — Assessment & Plan Note (Signed)
Following with [facility] yearly.  He is under watchful waiting for prostate malignancy.  No change in symptoms, nocturia is about the same.  No hematuria.  No weight loss.  Continue follow up with [facility].

## 2023-01-13 NOTE — Assessment & Plan Note (Signed)
He is on quadruple therapy.  Doing well, no complaints of headache, chest pain, dizziness.  BP today is 117/75.  He is on amlodipine (no issues with his gums or leg swelling), hydrochlorothiazide, lisinopril (last refill was in February, will need to confirm he is still taking, asked him to review his med bottles) and spironolactone.   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  Will check a BMET today.   Plan Continue 4 drug regimen BMET today.

## 2023-01-13 NOTE — Assessment & Plan Note (Signed)
LDL at last check was 47.  He is on rosuvastatin without issue.    Plan Continue rosuvastatin

## 2023-01-13 NOTE — Assessment & Plan Note (Signed)
Treated.  No change in symptoms.  Hand rash improved, however, now with rash on arms.  Will follow.  Recheck RPR if rash not improving after seeing dermatology.

## 2023-01-13 NOTE — Patient Instructions (Signed)
[name] - -  Thank you for coming in to see me today!    For your medications, please continue what you are taking.  Make sure you have lisinopril at home, and let me know if you don't.   Thank you!  Come back to see the clinic in 3 months.

## 2023-01-13 NOTE — Assessment & Plan Note (Signed)
Following with [facility] yearly.  He is under watchful waiting.  No change in symptoms, nocturia is about the same.  No hematuria.  No weight loss.  Continue follow up with [facility].

## 2023-01-13 NOTE — Assessment & Plan Note (Signed)
Stable long term problem.  He is on chronic Norco and this helps him remain active.  He has been helping his daughter move furniture into his new house, which has led to a slight worsening of his back pain.  Overall, he is active and exercises.  The pain medication is helping him to remain active.  He has no changes to his pain, no shooting pains, no bowel or bladder incontinence.  Stable.  We signed a new pain management contract today.   Plan Continue Norco at current dose.  Refill supplied.

## 2023-01-13 NOTE — Progress Notes (Signed)
Established Patient Office Visit  Subjective   Patient ID: Jeremy Hill, male    DOB: 07/18/1945  Age: 77 y.o. MRN: [id number]  Chief Complaint  Patient presents with   Follow-up    3 months    Hypertension    Jeremy Hill is a 77 year old man with PMH of well controlled HTN, spinal stenosis, psoriasis, h/o CRVO, prostate cancer undergoing watchful waiting, HLD, recently treated syphilis who presents for follow up.   Today, his blood pressure is very well controlled.  He has no pain, chest pain, headaches or dizziness.    On last check, his renal function has been higher, possibly mild CKD, we will get a BMET today.  His psoriasis is stable, but he has a new rash on his arms which is bumpy and on the posterior forearms.  He notes no known instigator.  He has no new deodorants or detergents.  They are not itchy and do not break down or have erythema.  He is due to see his dermatologist and he will discuss with that group.   He notes continued yearly follow up with [facility] and getting a PSA there.  He has no change in urinary symptoms at this time, not hematuria or other concerns.   [place] and he has been helping her move furniture, leading to somewhat worsening of his back pain.       Review of Systems  Constitutional:  Negative for chills, fever, malaise/fatigue and weight loss.  Eyes:  Positive for blurred vision (due to CRVO). Negative for pain.  Respiratory:  Negative for cough, sputum production and shortness of breath.   Cardiovascular:  Negative for chest pain, palpitations, claudication and leg swelling.  Gastrointestinal:  Negative for abdominal pain, blood in stool, constipation, nausea and vomiting.  Genitourinary:  Negative for dysuria, frequency, hematuria and urgency.  Musculoskeletal:  Positive for back pain (chronic, low back).  Skin:  Positive for rash (bumpy, on arms).  Neurological:  Negative for dizziness, sensory  change, focal weakness, weakness and headaches.      Objective:     BP 117/75 (BP Location: Left Arm, Patient Position: Sitting, Cuff Size: Large)   Pulse 73   Temp 98.2 F (36.8 C) (Oral)   Ht 6\' 1"  (1.854 m)   Wt 217 lb (98.4 kg)   SpO2 100%   BMI 28.63 kg/m  BP Readings from Last 3 Encounters:  01/13/23 117/75  08/28/22 108/78  04/17/22 126/70   Wt Readings from Last 3 Encounters:  01/13/23 217 lb (98.4 kg)  08/28/22 212 lb 6.4 oz (96.3 kg)  04/17/22 221 lb 8 oz (100.5 kg)      Physical Exam Vitals and nursing note reviewed.  Constitutional:      General: He is not in acute distress.    Appearance: Normal appearance. He is not ill-appearing or toxic-appearing.  HENT:     Head: Normocephalic and atraumatic.  Eyes:     General: No scleral icterus.    Comments: He has some whitening of the pupil on the right.  Otherwise no conjunctival changes.   Cardiovascular:     Rate and Rhythm: Normal rate and regular rhythm.     Pulses: Normal pulses.     Heart sounds: No murmur heard.    No gallop.  Pulmonary:     Effort: Pulmonary effort is normal. No respiratory distress.  Abdominal:     General: Abdomen is flat. Bowel  sounds are normal. There is no distension.     Tenderness: There is no abdominal tenderness.  Musculoskeletal:        General: No swelling or tenderness.     Right lower leg: No edema.     Left lower leg: No edema.  Skin:    General: Skin is warm and dry.     Coloration: Skin is not jaundiced.     Findings: No bruising.     Comments: He has a bumpy skin toned rash on the forearms which is skin colored, non confluent, no flaking or erythema.  Non warm.  Appears to be possible a drug reaction or eczema.    Neurological:     Mental Status: He is alert and oriented to person, place, and time. Mental status is at baseline.     Cranial Nerves: No cranial nerve deficit.     Motor: No weakness.  Psychiatric:        Mood and Affect: Mood normal.         Behavior: Behavior normal.      No results found for any visits on 01/13/23.    The ASCVD Risk score (Arnett DK, et al., 2019) failed to calculate for the following reasons:   The valid total cholesterol range is 130 to 320 mg/dL    Assessment & Plan:   Problem List Items Addressed This Visit       Medium    Essential hypertension - Primary (Chronic)    He is on quadruple therapy.  Doing well, no complaints of headache, chest pain, dizziness.  BP today is 117/75.  He is on amlodipine (no issues with his gums or leg swelling), hydrochlorothiazide, lisinopril (last refill was in February, will need to confirm he is still taking, asked him to review his med bottles) and spironolactone.   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  Will check a BMET today.   Plan Continue 4 drug regimen BMET today.       Relevant Medications   lisinopril (ZESTRIL) 40 MG tablet   Other Relevant Orders   BMP8+Anion Gap   Spinal stenosis    Stable long term problem.  He is on chronic Norco and this helps him remain active.  He has been helping his daughter move furniture into his new house, which has led to a slight worsening of his back pain.  Overall, he is active and exercises.  The pain medication is helping him to remain active.  He has no changes to his pain, no shooting pains, no bowel or bladder incontinence.  Stable.  We signed a new pain management contract today.   Plan Continue Norco at current dose.  Refill supplied.       Relevant Medications   HYDROcodone-acetaminophen (NORCO) 10-325 MG tablet   Psoriasis    Also with new rash today.  He has a steroid cream he uses intermittently.  Unclear what rash is, but may need biopsy.  He has a dermatologist and will set up an appointment.         Low   BPH (benign prostatic hyperplasia)    Following with [facility] yearly.  He is under watchful waiting for prostate malignancy.  No change in symptoms,  nocturia is about the same.  No hematuria.  No weight loss.  Continue follow up with [facility].         Unprioritized   HLD (hyperlipidemia)    LDL at last  check was 47.  He is on rosuvastatin without issue.    Plan Continue rosuvastatin      Relevant Medications   lisinopril (ZESTRIL) 40 MG tablet   Malignant neoplasm of prostate [facility])    Following with [facility] yearly.  He is under watchful waiting.  No change in symptoms, nocturia is about the same.  No hematuria.  No weight loss.  Continue follow up with [facility].       Syphilis in male    Treated.  No change in symptoms.  Hand rash improved, however, now with rash on arms.  Will follow.  Recheck RPR if rash not improving after seeing dermatology.        Return in about 3 months (around 04/15/2023).    Debe Coder, MD

## 2023-01-13 NOTE — Assessment & Plan Note (Signed)
Also with new rash today.  He has a steroid cream he uses intermittently.  Unclear what rash is, but may need biopsy.  He has a dermatologist and will set up an appointment.

## 2023-01-14 ENCOUNTER — Encounter: Payer: Self-pay | Admitting: Internal Medicine

## 2023-01-14 LAB — BMP8+ANION GAP
Anion Gap: 16 mmol/L (ref 10.0–18.0)
BUN/Creatinine Ratio: 13 (ref 10–24)
BUN: 20 mg/dL (ref 8–27)
CO2: 21 mmol/L (ref 20–29)
Calcium: 9.9 mg/dL (ref 8.6–10.2)
Chloride: 99 mmol/L (ref 96–106)
Creatinine, Ser: 1.51 mg/dL — ABNORMAL HIGH (ref 0.76–1.27)
Glucose: 122 mg/dL — ABNORMAL HIGH (ref 70–99)
Potassium: 4 mmol/L (ref 3.5–5.2)
Sodium: 136 mmol/L (ref 134–144)
eGFR: 48 mL/min/{1.73_m2} — ABNORMAL LOW (ref >59)

## 2023-01-15 ENCOUNTER — Encounter: Payer: Medicare HMO | Admitting: Internal Medicine

## 2023-01-18 ENCOUNTER — Encounter: Payer: Self-pay | Admitting: *Deleted

## 2023-02-10 ENCOUNTER — Other Ambulatory Visit: Payer: Self-pay

## 2023-02-10 DIAGNOSIS — M4800 Spinal stenosis, site unspecified: Secondary | ICD-10-CM

## 2023-02-10 MED ORDER — HYDROCODONE-ACETAMINOPHEN 10-325 MG PO TABS: 1.0000 | ORAL_TABLET | Freq: Three times a day (TID) | ORAL | 0 refills | Status: DC | PRN

## 2023-03-17 ENCOUNTER — Other Ambulatory Visit: Payer: Self-pay | Admitting: Internal Medicine

## 2023-03-17 DIAGNOSIS — M4800 Spinal stenosis, site unspecified: Secondary | ICD-10-CM

## 2023-03-17 MED ORDER — HYDROCODONE-ACETAMINOPHEN 10-325 MG PO TABS: 1.0000 | ORAL_TABLET | Freq: Three times a day (TID) | ORAL | 0 refills | Status: DC | PRN

## 2023-03-17 NOTE — Telephone Encounter (Signed)
 Prescription Request  03/17/2023  LOV: Visit date not found  What is the name of the medication or equipment? HYDROcodone -acetaminophen  (NORCO) 10-325 MG tablet    Have you contacted your pharmacy to request a refill? No   Which pharmacy would you like this sent to?  [facility] [phone] - [place], [place] - [address] AT [facility] [address] [place] [address] Phone: [phone] Fax: [phone]    Patient notified that their request is being sent to the clinical staff for review and that they should receive a response within 2 business days.   Please advise at Mobile [phone] (mobile)

## 2023-03-17 NOTE — Telephone Encounter (Signed)
 Last rx written - 02/10/23. Last OV - 01/13/23. Next OV - 3/26 with PCP. TOX - 06/04/20.

## 2023-04-20 ENCOUNTER — Other Ambulatory Visit: Payer: Self-pay

## 2023-04-20 DIAGNOSIS — M4800 Spinal stenosis, site unspecified: Secondary | ICD-10-CM

## 2023-04-20 MED ORDER — HYDROCODONE-ACETAMINOPHEN 10-325 MG PO TABS: 1.0000 | ORAL_TABLET | Freq: Three times a day (TID) | ORAL | 0 refills | Status: DC | PRN

## 2023-05-13 ENCOUNTER — Other Ambulatory Visit: Payer: Self-pay | Admitting: Internal Medicine

## 2023-05-13 DIAGNOSIS — M4800 Spinal stenosis, site unspecified: Secondary | ICD-10-CM

## 2023-05-13 MED ORDER — HYDROCODONE-ACETAMINOPHEN 10-325 MG PO TABS: 1.0000 | ORAL_TABLET | Freq: Three times a day (TID) | ORAL | 0 refills | Status: DC | PRN

## 2023-05-13 NOTE — Telephone Encounter (Signed)
 Copied from CRM [id number]. Topic: Clinical - Medication Refill >> May 13, 2023  9:44 AM Maxwell Marion wrote: Most Recent Primary Care Visit:  Provider: Debe Coder B  Department: [facility]  Visit Type: OPEN ESTABLISHED  Date: 01/13/2023  Medication: HYDROcodone-acetaminophen (NORCO) 10-325 MG tablet  Has the patient contacted their pharmacy? No (Agent: If no, request that the patient contact the pharmacy for the refill. If patient does not wish to contact the pharmacy document the reason why and proceed with request.) (Agent: If yes, when and what did the pharmacy advise?)  Is this the correct pharmacy for this prescription? Yes If no, delete pharmacy and type the correct one.  This is the patient's preferred pharmacy:  [facility] [phone] - [place], [place] - [address] AT [facility] OF [address] [place] [address] Phone: [phone] Fax: [phone]   Has the prescription been filled recently? No  Is the patient out of the medication? Yes  Has the patient been seen for an appointment in the last year OR does the patient have an upcoming appointment? Yes  Can we respond through MyChart? No  Agent: Please be advised that Rx refills may take up to 3 business days. We ask that you follow-up with your pharmacy.

## 2023-05-24 ENCOUNTER — Telehealth: Payer: Self-pay

## 2023-05-24 NOTE — Telephone Encounter (Signed)
 Called pt back-no answer, LVM for pt to call the clinic back.

## 2023-05-24 NOTE — Telephone Encounter (Signed)
 Copied from CRM [id number]. Topic: Appointments - Scheduling Inquiry for Clinic >> May 21, 2023  9:34 AM Alfred Levins wrote: Reason for CRM: Patient Haug had to reschedule because he was double booked  and could not reschedule his [facility] appt. He would like to know if you could work him in that afternoon on March 26 or if you had any appointments sooner that April. Please advise.

## 2023-05-24 NOTE — Telephone Encounter (Signed)
 Pt called back regarding an appt. Pt states he will keep his appt on 06/23/2023 with Dr. Lafonda Mosses.

## 2023-05-26 ENCOUNTER — Telehealth: Payer: Self-pay

## 2023-05-26 ENCOUNTER — Encounter: Payer: No Typology Code available for payment source | Admitting: Internal Medicine

## 2023-05-26 NOTE — Telephone Encounter (Signed)
 I spoke with patient, states he will keep his appt with Dr. Sol Blazing on April.

## 2023-05-26 NOTE — Telephone Encounter (Signed)
 Copied from CRM [id number]. Topic: Appointments - Scheduling Inquiry for Clinic >> May 24, 2023  9:54 AM Thomes Dinning wrote: Reason for CRM: Patient returning call from May.

## 2023-06-07 ENCOUNTER — Telehealth: Payer: Self-pay

## 2023-06-07 NOTE — Telephone Encounter (Signed)
 I called pt back, LVM to call clinic back.

## 2023-06-07 NOTE — Telephone Encounter (Signed)
 Copied from CRM [id number]. Topic: Appointments - Scheduling Inquiry for Clinic >> Jun 04, 2023  9:11 AM Philippa Chester F wrote: Reason for CRM:   Patient called in stating that he would like to see his PCP on the 06/23/23 but unfortunately has his cancer treatment that same morning and knows it will not end until 11 am. Patient wants to still see her and would like to know if there was any possibility for the provider to accommodate him possibly coming in between 11:30am to 12pm.   Callback Number: [phone]   Please leave a voicemail if the patient doesn't answer.

## 2023-06-10 ENCOUNTER — Ambulatory Visit (HOSPITAL_COMMUNITY)
Admission: EM | Admit: 2023-06-10 | Discharge: 2023-06-10 | Disposition: A | Discharge status: Home / Self Care | Attending: Family Medicine | Admitting: Family Medicine

## 2023-06-10 ENCOUNTER — Other Ambulatory Visit: Payer: Self-pay

## 2023-06-10 ENCOUNTER — Inpatient Hospital Stay (HOSPITAL_COMMUNITY)
Admission: EM | Admit: 2023-06-10 | Discharge: 2023-06-13 | DRG: 281 | Disposition: A | Discharge status: Home / Self Care | Attending: Cardiovascular Disease | Admitting: Cardiovascular Disease

## 2023-06-10 ENCOUNTER — Encounter (HOSPITAL_COMMUNITY): Payer: Self-pay

## 2023-06-10 DIAGNOSIS — I213 ST elevation (STEMI) myocardial infarction of unspecified site: Principal | ICD-10-CM | POA: Diagnosis present

## 2023-06-10 DIAGNOSIS — C61 Malignant neoplasm of prostate: Secondary | ICD-10-CM | POA: Diagnosis present

## 2023-06-10 DIAGNOSIS — M4624 Osteomyelitis of vertebra, thoracic region: Secondary | ICD-10-CM | POA: Diagnosis present

## 2023-06-10 DIAGNOSIS — I10 Essential (primary) hypertension: Secondary | ICD-10-CM | POA: Diagnosis present

## 2023-06-10 DIAGNOSIS — M4800 Spinal stenosis, site unspecified: Secondary | ICD-10-CM | POA: Diagnosis present

## 2023-06-10 DIAGNOSIS — A530 Latent syphilis, unspecified as early or late: Secondary | ICD-10-CM | POA: Diagnosis present

## 2023-06-10 DIAGNOSIS — Z8249 Family history of ischemic heart disease and other diseases of the circulatory system: Secondary | ICD-10-CM

## 2023-06-10 DIAGNOSIS — Z5982 Transportation insecurity: Secondary | ICD-10-CM

## 2023-06-10 DIAGNOSIS — F32A Depression, unspecified: Secondary | ICD-10-CM | POA: Diagnosis present

## 2023-06-10 DIAGNOSIS — I2129 ST elevation (STEMI) myocardial infarction involving other sites: Secondary | ICD-10-CM | POA: Diagnosis not present

## 2023-06-10 DIAGNOSIS — E785 Hyperlipidemia, unspecified: Secondary | ICD-10-CM | POA: Diagnosis present

## 2023-06-10 DIAGNOSIS — K4090 Unilateral inguinal hernia, without obstruction or gangrene, not specified as recurrent: Secondary | ICD-10-CM | POA: Diagnosis present

## 2023-06-10 DIAGNOSIS — Z8572 Personal history of non-Hodgkin lymphomas: Secondary | ICD-10-CM

## 2023-06-10 DIAGNOSIS — Z8546 Personal history of malignant neoplasm of prostate: Secondary | ICD-10-CM

## 2023-06-10 DIAGNOSIS — R1011 Right upper quadrant pain: Secondary | ICD-10-CM | POA: Diagnosis not present

## 2023-06-10 DIAGNOSIS — R109 Unspecified abdominal pain: Secondary | ICD-10-CM | POA: Insufficient documentation

## 2023-06-10 DIAGNOSIS — R1010 Upper abdominal pain, unspecified: Secondary | ICD-10-CM

## 2023-06-10 DIAGNOSIS — Z885 Allergy status to narcotic agent status: Secondary | ICD-10-CM

## 2023-06-10 DIAGNOSIS — Z79899 Other long term (current) drug therapy: Secondary | ICD-10-CM

## 2023-06-10 DIAGNOSIS — Z5941 Food insecurity: Secondary | ICD-10-CM

## 2023-06-10 LAB — CBC
HCT: 38 % — ABNORMAL LOW (ref 39.0–52.0)
Hemoglobin: 12.1 g/dL — ABNORMAL LOW (ref 13.0–17.0)
MCH: 28.1 pg (ref 26.0–34.0)
MCHC: 31.8 g/dL (ref 30.0–36.0)
MCV: 88.4 fL (ref 80.0–100.0)
Platelets: 238 10*3/uL (ref 150–400)
RBC: 4.3 MIL/uL (ref 4.22–5.81)
RDW: 13.6 % (ref 11.5–15.5)
WBC: 9.6 10*3/uL (ref 4.0–10.5)
nRBC: 0 % (ref 0.0–0.2)

## 2023-06-10 LAB — COMPREHENSIVE METABOLIC PANEL WITH GFR
ALT: 17 U/L (ref 0–44)
AST: 21 U/L (ref 15–41)
Albumin: 3.9 g/dL (ref 3.5–5.0)
Alkaline Phosphatase: 53 U/L (ref 38–126)
Anion gap: 10 (ref 5–15)
BUN: 17 mg/dL (ref 8–23)
CO2: 27 mmol/L (ref 22–32)
Calcium: 9.6 mg/dL (ref 8.9–10.3)
Chloride: 99 mmol/L (ref 98–111)
Creatinine, Ser: 1.18 mg/dL (ref 0.61–1.24)
GFR, Estimated: >60 mL/min (ref >60)
Glucose, Bld: 111 mg/dL — ABNORMAL HIGH (ref 70–99)
Potassium: 3.8 mmol/L (ref 3.5–5.1)
Sodium: 136 mmol/L (ref 135–145)
Total Bilirubin: 1.3 mg/dL — ABNORMAL HIGH (ref 0.0–1.2)
Total Protein: 7.7 g/dL (ref 6.5–8.1)

## 2023-06-10 LAB — POCT URINALYSIS DIP (MANUAL ENTRY)
Bilirubin, UA: NEGATIVE
Blood, UA: NEGATIVE
Glucose, UA: NEGATIVE mg/dL
Ketones, POC UA: NEGATIVE mg/dL
Leukocytes, UA: NEGATIVE
Nitrite, UA: NEGATIVE
Protein Ur, POC: NEGATIVE mg/dL
Spec Grav, UA: 1.02 (ref 1.010–1.025)
Urobilinogen, UA: 1 U/dL
pH, UA: 7.5 (ref 5.0–8.0)

## 2023-06-10 LAB — URINALYSIS, ROUTINE W REFLEX MICROSCOPIC
Bilirubin Urine: NEGATIVE
Glucose, UA: NEGATIVE mg/dL
Hgb urine dipstick: NEGATIVE
Ketones, ur: NEGATIVE mg/dL
Leukocytes,Ua: NEGATIVE
Nitrite: NEGATIVE
Protein, ur: NEGATIVE mg/dL
Specific Gravity, Urine: 1.023 (ref 1.005–1.030)
pH: 6 (ref 5.0–8.0)

## 2023-06-10 LAB — POCT FASTING CBG KUC MANUAL ENTRY: POCT Glucose (KUC): 108 mg/dL — AB (ref 70–99)

## 2023-06-10 LAB — LIPASE, BLOOD: Lipase: 24 U/L (ref 11–51)

## 2023-06-10 MED ORDER — KETOROLAC TROMETHAMINE 30 MG/ML IJ SOLN
15.0000 mg | Freq: Once | INTRAMUSCULAR | Status: AC
  Administered 2023-06-10: 15 mg via INTRAMUSCULAR

## 2023-06-10 MED ORDER — KETOROLAC TROMETHAMINE 30 MG/ML IJ SOLN: 30.0000 mg | Freq: Once | INTRAMUSCULAR | Status: DC

## 2023-06-10 MED ORDER — KETOROLAC TROMETHAMINE 30 MG/ML IJ SOLN
INTRAMUSCULAR | Status: AC
  Filled 2023-06-10: qty 1

## 2023-06-10 NOTE — ED Triage Notes (Signed)
 Pt c/o abdominal pain with tightness and distension since this am. States unable to get comfortable today. Last NBM was this am. States only had ginger ale today. Denies n/v/d. States has been urinating a lot today.

## 2023-06-10 NOTE — ED Notes (Signed)
 Patient is being discharged from the Urgent Care and sent to the Emergency Department via POC . Per Dr. Tracie Harrier, patient is in need of higher level of care due to abdominal pain. Patient is aware and verbalizes understanding of plan of care.  Vitals:   06/10/23 1853  BP: (!) 166/101  Pulse: 88  Resp: 20  Temp: 100 F (37.8 C)  SpO2: 93%

## 2023-06-10 NOTE — ED Triage Notes (Signed)
 Patient reports pain across abdomen onset yesterday morning , denies emesis or diarrhea , no fever or chills.

## 2023-06-11 ENCOUNTER — Encounter (HOSPITAL_COMMUNITY): Payer: Self-pay | Admitting: Internal Medicine

## 2023-06-11 ENCOUNTER — Emergency Department (HOSPITAL_COMMUNITY)

## 2023-06-11 ENCOUNTER — Inpatient Hospital Stay (HOSPITAL_COMMUNITY)

## 2023-06-11 ENCOUNTER — Encounter (HOSPITAL_COMMUNITY)
Admission: EM | Disposition: A | Discharge status: Home / Self Care | Payer: Self-pay | Source: Home / Self Care | Attending: Internal Medicine

## 2023-06-11 DIAGNOSIS — Z79899 Other long term (current) drug therapy: Secondary | ICD-10-CM | POA: Diagnosis not present

## 2023-06-11 DIAGNOSIS — Z8546 Personal history of malignant neoplasm of prostate: Secondary | ICD-10-CM | POA: Diagnosis not present

## 2023-06-11 DIAGNOSIS — J9811 Atelectasis: Secondary | ICD-10-CM | POA: Diagnosis not present

## 2023-06-11 DIAGNOSIS — I2121 ST elevation (STEMI) myocardial infarction involving left circumflex coronary artery: Secondary | ICD-10-CM

## 2023-06-11 DIAGNOSIS — R079 Chest pain, unspecified: Secondary | ICD-10-CM

## 2023-06-11 DIAGNOSIS — C61 Malignant neoplasm of prostate: Secondary | ICD-10-CM | POA: Diagnosis not present

## 2023-06-11 DIAGNOSIS — I213 ST elevation (STEMI) myocardial infarction of unspecified site: Secondary | ICD-10-CM | POA: Diagnosis not present

## 2023-06-11 DIAGNOSIS — Z5941 Food insecurity: Secondary | ICD-10-CM | POA: Diagnosis not present

## 2023-06-11 DIAGNOSIS — I491 Atrial premature depolarization: Secondary | ICD-10-CM

## 2023-06-11 DIAGNOSIS — Q2733 Arteriovenous malformation of digestive system vessel: Secondary | ICD-10-CM | POA: Diagnosis not present

## 2023-06-11 DIAGNOSIS — E785 Hyperlipidemia, unspecified: Secondary | ICD-10-CM | POA: Diagnosis not present

## 2023-06-11 DIAGNOSIS — Z8572 Personal history of non-Hodgkin lymphomas: Secondary | ICD-10-CM | POA: Diagnosis not present

## 2023-06-11 DIAGNOSIS — M4624 Osteomyelitis of vertebra, thoracic region: Secondary | ICD-10-CM | POA: Diagnosis not present

## 2023-06-11 DIAGNOSIS — Z885 Allergy status to narcotic agent status: Secondary | ICD-10-CM | POA: Diagnosis not present

## 2023-06-11 DIAGNOSIS — I2510 Atherosclerotic heart disease of native coronary artery without angina pectoris: Secondary | ICD-10-CM | POA: Diagnosis not present

## 2023-06-11 DIAGNOSIS — I708 Atherosclerosis of other arteries: Secondary | ICD-10-CM | POA: Diagnosis not present

## 2023-06-11 DIAGNOSIS — Z5982 Transportation insecurity: Secondary | ICD-10-CM | POA: Diagnosis not present

## 2023-06-11 DIAGNOSIS — I2129 ST elevation (STEMI) myocardial infarction involving other sites: Secondary | ICD-10-CM | POA: Diagnosis not present

## 2023-06-11 DIAGNOSIS — I10 Essential (primary) hypertension: Secondary | ICD-10-CM

## 2023-06-11 DIAGNOSIS — K4090 Unilateral inguinal hernia, without obstruction or gangrene, not specified as recurrent: Secondary | ICD-10-CM | POA: Diagnosis not present

## 2023-06-11 DIAGNOSIS — A530 Latent syphilis, unspecified as early or late: Secondary | ICD-10-CM | POA: Diagnosis not present

## 2023-06-11 DIAGNOSIS — R1011 Right upper quadrant pain: Secondary | ICD-10-CM | POA: Diagnosis not present

## 2023-06-11 DIAGNOSIS — F32A Depression, unspecified: Secondary | ICD-10-CM | POA: Diagnosis not present

## 2023-06-11 DIAGNOSIS — R1013 Epigastric pain: Secondary | ICD-10-CM | POA: Diagnosis not present

## 2023-06-11 DIAGNOSIS — I219 Acute myocardial infarction, unspecified: Secondary | ICD-10-CM | POA: Diagnosis not present

## 2023-06-11 DIAGNOSIS — M4800 Spinal stenosis, site unspecified: Secondary | ICD-10-CM | POA: Diagnosis not present

## 2023-06-11 DIAGNOSIS — Z8249 Family history of ischemic heart disease and other diseases of the circulatory system: Secondary | ICD-10-CM | POA: Diagnosis not present

## 2023-06-11 HISTORY — DX: ST elevation (STEMI) myocardial infarction of unspecified site: I21.3

## 2023-06-11 HISTORY — PX: LEFT HEART CATH AND CORONARY ANGIOGRAPHY: CATH118249

## 2023-06-11 LAB — TROPONIN I (HIGH SENSITIVITY)
Troponin I (High Sensitivity): 69 ng/L — ABNORMAL HIGH (ref <18)
Troponin I (High Sensitivity): 66 ng/L — ABNORMAL HIGH (ref <18)
Troponin I (High Sensitivity): 61 ng/L — ABNORMAL HIGH (ref <18)

## 2023-06-11 LAB — LIPID PANEL
Cholesterol: 167 mg/dL (ref 0–200)
HDL: 56 mg/dL (ref >40)
LDL Cholesterol: 105 mg/dL — ABNORMAL HIGH (ref 0–99)
Total CHOL/HDL Ratio: 3 ratio
Triglycerides: 31 mg/dL (ref <150)
VLDL: 6 mg/dL (ref 0–40)

## 2023-06-11 LAB — HEMOGLOBIN A1C
Hgb A1c MFr Bld: 5.2 % (ref 4.8–5.6)
Mean Plasma Glucose: 102.54 mg/dL

## 2023-06-11 LAB — ECHOCARDIOGRAM COMPLETE
AR max vel: 3.4 cm2
AV Area VTI: 3.49 cm2
AV Area mean vel: 2.88 cm2
AV Mean grad: 4 mmHg
AV Peak grad: 7.5 mmHg
Ao pk vel: 1.37 m/s
Area-P 1/2: 2.99 cm2
Calc EF: 68.9 %
S' Lateral: 3.7 cm
Single Plane A2C EF: 71.3 %
Single Plane A4C EF: 66.8 %
Weight: 3527.36 [oz_av]

## 2023-06-11 LAB — CBC WITH DIFFERENTIAL/PLATELET
Abs Immature Granulocytes: 0.02 10*3/uL (ref 0.00–0.07)
Basophils Absolute: 0 10*3/uL (ref 0.0–0.1)
Basophils Relative: 0 %
Eosinophils Absolute: 0.1 10*3/uL (ref 0.0–0.5)
Eosinophils Relative: 1 %
HCT: 36 % — ABNORMAL LOW (ref 39.0–52.0)
Hemoglobin: 11.7 g/dL — ABNORMAL LOW (ref 13.0–17.0)
Immature Granulocytes: 0 %
Lymphocytes Relative: 23 %
Lymphs Abs: 1.7 10*3/uL (ref 0.7–4.0)
MCH: 28.2 pg (ref 26.0–34.0)
MCHC: 32.5 g/dL (ref 30.0–36.0)
MCV: 86.7 fL (ref 80.0–100.0)
Monocytes Absolute: 0.7 10*3/uL (ref 0.1–1.0)
Monocytes Relative: 9 %
Neutro Abs: 4.9 10*3/uL (ref 1.7–7.7)
Neutrophils Relative %: 67 %
Platelets: 220 10*3/uL (ref 150–400)
RBC: 4.15 MIL/uL — ABNORMAL LOW (ref 4.22–5.81)
RDW: 13.6 % (ref 11.5–15.5)
WBC: 7.3 10*3/uL (ref 4.0–10.5)
nRBC: 0 % (ref 0.0–0.2)

## 2023-06-11 LAB — BASIC METABOLIC PANEL WITH GFR
Anion gap: 9 (ref 5–15)
BUN: 16 mg/dL (ref 8–23)
CO2: 25 mmol/L (ref 22–32)
Calcium: 8.9 mg/dL (ref 8.9–10.3)
Chloride: 100 mmol/L (ref 98–111)
Creatinine, Ser: 1.13 mg/dL (ref 0.61–1.24)
GFR, Estimated: >60 mL/min (ref >60)
Glucose, Bld: 106 mg/dL — ABNORMAL HIGH (ref 70–99)
Potassium: 3.5 mmol/L (ref 3.5–5.1)
Sodium: 134 mmol/L — ABNORMAL LOW (ref 135–145)

## 2023-06-11 LAB — PROTIME-INR
INR: 1.1 (ref 0.8–1.2)
Prothrombin Time: 14.4 s (ref 11.4–15.2)

## 2023-06-11 LAB — POCT ACTIVATED CLOTTING TIME: Activated Clotting Time: 216 s

## 2023-06-11 LAB — MRSA NEXT GEN BY PCR, NASAL: MRSA by PCR Next Gen: NOT DETECTED

## 2023-06-11 LAB — GLUCOSE, CAPILLARY: Glucose-Capillary: 100 mg/dL — ABNORMAL HIGH (ref 70–99)

## 2023-06-11 LAB — I-STAT CG4 LACTIC ACID, ED: Lactic Acid, Venous: 0.7 mmol/L (ref 0.5–1.9)

## 2023-06-11 SURGERY — LEFT HEART CATH AND CORONARY ANGIOGRAPHY
Anesthesia: LOCAL

## 2023-06-11 MED ORDER — ASPIRIN 81 MG PO TBEC
81.0000 mg | DELAYED_RELEASE_TABLET | Freq: Every day | ORAL | Status: DC
  Administered 2023-06-12 – 2023-06-13 (×2): 81 mg via ORAL
  Filled 2023-06-11 (×2): qty 1

## 2023-06-11 MED ORDER — MIDAZOLAM HCL 2 MG/2ML IJ SOLN
INTRAMUSCULAR | Status: AC
  Filled 2023-06-11: qty 2

## 2023-06-11 MED ORDER — ATORVASTATIN CALCIUM 40 MG PO TABS
80.0000 mg | ORAL_TABLET | Freq: Every day | ORAL | Status: DC
  Filled 2023-06-11: qty 2

## 2023-06-11 MED ORDER — ACETAMINOPHEN 325 MG PO TABS: 650.0000 mg | ORAL_TABLET | ORAL | Status: DC | PRN

## 2023-06-11 MED ORDER — SODIUM CHLORIDE 0.9 % IV SOLN: 250.0000 mL | INTRAVENOUS | Status: AC | PRN

## 2023-06-11 MED ORDER — HEPARIN (PORCINE) IN NACL 1000-0.9 UT/500ML-% IV SOLN
INTRAVENOUS | Status: DC | PRN
  Administered 2023-06-11 (×2): 500 mL

## 2023-06-11 MED ORDER — HYDROCHLOROTHIAZIDE 25 MG PO TABS: 25.0000 mg | ORAL_TABLET | Freq: Every day | ORAL | Status: DC

## 2023-06-11 MED ORDER — HYDRALAZINE HCL 20 MG/ML IJ SOLN: 10.0000 mg | INTRAMUSCULAR | Status: AC | PRN

## 2023-06-11 MED ORDER — SODIUM CHLORIDE 0.9 % IV SOLN
INTRAVENOUS | Status: AC | PRN
  Administered 2023-06-11: 10 mL/h via INTRAVENOUS

## 2023-06-11 MED ORDER — ORAL CARE MOUTH RINSE: 15.0000 mL | OROMUCOSAL | Status: DC | PRN

## 2023-06-11 MED ORDER — ALBUTEROL SULFATE HFA 108 (90 BASE) MCG/ACT IN AERS
INHALATION_SPRAY | RESPIRATORY_TRACT | Status: AC
  Filled 2023-06-11: qty 6.7

## 2023-06-11 MED ORDER — VERAPAMIL HCL 2.5 MG/ML IV SOLN
INTRAVENOUS | Status: AC
  Filled 2023-06-11: qty 2

## 2023-06-11 MED ORDER — SENNA 8.6 MG PO TABS
1.0000 | ORAL_TABLET | Freq: Every day | ORAL | Status: DC
  Administered 2023-06-11 – 2023-06-13 (×3): 8.6 mg via ORAL
  Filled 2023-06-11 (×3): qty 1

## 2023-06-11 MED ORDER — FENTANYL CITRATE PF 50 MCG/ML IJ SOSY
50.0000 ug | PREFILLED_SYRINGE | Freq: Once | INTRAMUSCULAR | Status: AC
  Administered 2023-06-11: 50 ug via INTRAVENOUS
  Filled 2023-06-11: qty 1

## 2023-06-11 MED ORDER — ASPIRIN 325 MG PO TABS: 325.0000 mg | ORAL_TABLET | Freq: Every day | ORAL | Status: DC

## 2023-06-11 MED ORDER — FENTANYL CITRATE (PF) 100 MCG/2ML IJ SOLN
INTRAMUSCULAR | Status: DC | PRN
  Administered 2023-06-11: 25 ug via INTRAVENOUS

## 2023-06-11 MED ORDER — HEPARIN SODIUM (PORCINE) 1000 UNIT/ML IJ SOLN
INTRAMUSCULAR | Status: DC | PRN
  Administered 2023-06-11: 5000 [IU] via INTRAVENOUS

## 2023-06-11 MED ORDER — SODIUM CHLORIDE 0.9% FLUSH: 3.0000 mL | INTRAVENOUS | Status: DC | PRN

## 2023-06-11 MED ORDER — LIDOCAINE HCL (PF) 1 % IJ SOLN
INTRAMUSCULAR | Status: DC | PRN
  Administered 2023-06-11: 2 mL

## 2023-06-11 MED ORDER — IOHEXOL 350 MG/ML SOLN
INTRAVENOUS | Status: DC | PRN
  Administered 2023-06-11: 75 mL via INTRA_ARTERIAL

## 2023-06-11 MED ORDER — ROSUVASTATIN CALCIUM 20 MG PO TABS
20.0000 mg | ORAL_TABLET | Freq: Every day | ORAL | Status: DC
  Administered 2023-06-11 – 2023-06-12 (×2): 20 mg via ORAL
  Filled 2023-06-11 (×2): qty 1

## 2023-06-11 MED ORDER — LIDOCAINE HCL (PF) 1 % IJ SOLN
INTRAMUSCULAR | Status: AC
  Filled 2023-06-11: qty 30

## 2023-06-11 MED ORDER — MIDAZOLAM HCL 2 MG/2ML IJ SOLN
INTRAMUSCULAR | Status: DC | PRN
  Administered 2023-06-11: 1 mg via INTRAVENOUS

## 2023-06-11 MED ORDER — HYDROCHLOROTHIAZIDE 25 MG PO TABS
25.0000 mg | ORAL_TABLET | Freq: Every day | ORAL | Status: DC
Start: 2023-06-11 — End: 2023-06-13
  Administered 2023-06-11 – 2023-06-13 (×3): 25 mg via ORAL
  Filled 2023-06-11 (×3): qty 1

## 2023-06-11 MED ORDER — ACETAMINOPHEN 325 MG PO TABS
650.0000 mg | ORAL_TABLET | ORAL | Status: DC | PRN
  Administered 2023-06-11: 650 mg via ORAL
  Filled 2023-06-11: qty 2

## 2023-06-11 MED ORDER — METOPROLOL TARTRATE 25 MG PO TABS
25.0000 mg | ORAL_TABLET | Freq: Two times a day (BID) | ORAL | Status: DC
  Administered 2023-06-11 – 2023-06-13 (×5): 25 mg via ORAL
  Filled 2023-06-11 (×5): qty 1

## 2023-06-11 MED ORDER — SPIRONOLACTONE 25 MG PO TABS
25.0000 mg | ORAL_TABLET | Freq: Every day | ORAL | Status: DC
  Administered 2023-06-11 – 2023-06-13 (×3): 25 mg via ORAL
  Filled 2023-06-11 (×3): qty 1

## 2023-06-11 MED ORDER — HEPARIN SODIUM (PORCINE) 5000 UNIT/ML IJ SOLN
4000.0000 [IU] | Freq: Once | INTRAMUSCULAR | Status: AC
  Administered 2023-06-11: 4000 [IU] via INTRAVENOUS
  Filled 2023-06-11: qty 1

## 2023-06-11 MED ORDER — CLOPIDOGREL BISULFATE 75 MG PO TABS
75.0000 mg | ORAL_TABLET | Freq: Every day | ORAL | Status: DC
  Administered 2023-06-12 – 2023-06-13 (×2): 75 mg via ORAL
  Filled 2023-06-11 (×2): qty 1

## 2023-06-11 MED ORDER — LISINOPRIL 20 MG PO TABS: 40.0000 mg | ORAL_TABLET | Freq: Every day | ORAL | Status: DC

## 2023-06-11 MED ORDER — AMLODIPINE BESYLATE 10 MG PO TABS
10.0000 mg | ORAL_TABLET | Freq: Every day | ORAL | Status: DC
  Administered 2023-06-11 – 2023-06-13 (×3): 10 mg via ORAL
  Filled 2023-06-11: qty 1
  Filled 2023-06-11 (×3): qty 2

## 2023-06-11 MED ORDER — HEPARIN SODIUM (PORCINE) 1000 UNIT/ML IJ SOLN
INTRAMUSCULAR | Status: AC
  Filled 2023-06-11: qty 10

## 2023-06-11 MED ORDER — ONDANSETRON HCL 4 MG/2ML IJ SOLN: 4.0000 mg | Freq: Four times a day (QID) | INTRAMUSCULAR | Status: DC | PRN

## 2023-06-11 MED ORDER — ATORVASTATIN CALCIUM 40 MG PO TABS
80.0000 mg | ORAL_TABLET | Freq: Once | ORAL | Status: AC
  Administered 2023-06-11: 80 mg via ORAL
  Filled 2023-06-11: qty 2

## 2023-06-11 MED ORDER — ASPIRIN 325 MG PO TABS
325.0000 mg | ORAL_TABLET | Freq: Once | ORAL | Status: AC
  Administered 2023-06-11: 325 mg via ORAL
  Filled 2023-06-11: qty 1

## 2023-06-11 MED ORDER — LABETALOL HCL 5 MG/ML IV SOLN
10.0000 mg | INTRAVENOUS | Status: AC | PRN
  Administered 2023-06-11: 10 mg via INTRAVENOUS
  Filled 2023-06-11: qty 4

## 2023-06-11 MED ORDER — SODIUM CHLORIDE 0.9% FLUSH
3.0000 mL | Freq: Two times a day (BID) | INTRAVENOUS | Status: DC
  Administered 2023-06-11 – 2023-06-13 (×5): 3 mL via INTRAVENOUS

## 2023-06-11 MED ORDER — CLOPIDOGREL BISULFATE 300 MG PO TABS
300.0000 mg | ORAL_TABLET | Freq: Once | ORAL | Status: AC
  Administered 2023-06-11: 300 mg via ORAL
  Filled 2023-06-11: qty 1

## 2023-06-11 MED ORDER — PERFLUTREN LIPID MICROSPHERE
1.0000 mL | INTRAVENOUS | Status: AC | PRN
  Administered 2023-06-11: 2 mL via INTRAVENOUS

## 2023-06-11 MED ORDER — IOHEXOL 350 MG/ML SOLN
100.0000 mL | Freq: Once | INTRAVENOUS | Status: AC | PRN
  Administered 2023-06-11: 100 mL via INTRAVENOUS

## 2023-06-11 MED ORDER — VERAPAMIL HCL 2.5 MG/ML IV SOLN
INTRAVENOUS | Status: DC | PRN
  Administered 2023-06-11: 10 mL via INTRA_ARTERIAL

## 2023-06-11 MED ORDER — LISINOPRIL 20 MG PO TABS
40.0000 mg | ORAL_TABLET | Freq: Every day | ORAL | Status: DC
Start: 2023-06-11 — End: 2023-06-13
  Administered 2023-06-11 – 2023-06-13 (×3): 40 mg via ORAL
  Filled 2023-06-11 (×3): qty 2

## 2023-06-11 MED ORDER — NITROGLYCERIN 2 % TD OINT
0.5000 [in_us] | TOPICAL_OINTMENT | Freq: Once | TRANSDERMAL | Status: AC
  Administered 2023-06-11: 0.5 [in_us] via TOPICAL
  Filled 2023-06-11: qty 1

## 2023-06-11 MED ORDER — FENTANYL CITRATE (PF) 100 MCG/2ML IJ SOLN
INTRAMUSCULAR | Status: AC
  Filled 2023-06-11: qty 2

## 2023-06-11 SURGICAL SUPPLY — 15 items
CATH DIAG 6FR JR4 (CATHETERS) IMPLANT
CATH INFINITI 5FR ANG PIGTAIL (CATHETERS) IMPLANT
CATH LAUNCHER 6FR EBU3.5 (CATHETERS) IMPLANT
COVER PRB 48X5XTLSCP FOLD TPE (BAG) IMPLANT
DEVICE RAD COMP TR BAND LRG (VASCULAR PRODUCTS) IMPLANT
GLIDESHEATH SLEND SS 6F .021 (SHEATH) IMPLANT
GUIDEWIRE INQWIRE 1.5J.035X260 (WIRE) IMPLANT
GUIDEWIRE VAS SION BLUE 190 (WIRE) IMPLANT
INQWIRE 1.5J .035X260CM (WIRE) ×1 IMPLANT
KIT ENCORE 26 ADVANTAGE (KITS) IMPLANT
KIT SYRINGE INJ CVI SPIKEX1 (MISCELLANEOUS) IMPLANT
PACK CARDIAC CATHETERIZATION (CUSTOM PROCEDURE TRAY) ×2 IMPLANT
SET ATX-X65L (MISCELLANEOUS) IMPLANT
SHEATH 6FR 75 DEST SLENDER (SHEATH) IMPLANT
WIRE HI TORQ VERSACORE-J 145CM (WIRE) IMPLANT

## 2023-06-11 NOTE — Plan of Care (Signed)
  Problem: Education: Goal: Knowledge of General Education information will improve Description: Including pain rating scale, medication(s)/side effects and non-pharmacologic comfort measures Outcome: Progressing   Problem: Health Behavior/Discharge Planning: Goal: Ability to manage health-related needs will improve Outcome: Progressing   Problem: Clinical Measurements: Goal: Ability to maintain clinical measurements within normal limits will improve Outcome: Progressing Goal: Will remain free from infection Outcome: Progressing Goal: Diagnostic test results will improve Outcome: Progressing Goal: Respiratory complications will improve Outcome: Progressing Goal: Cardiovascular complication will be avoided Outcome: Progressing   Problem: Activity: Goal: Risk for activity intolerance will decrease Outcome: Progressing   Problem: Nutrition: Goal: Adequate nutrition will be maintained Outcome: Progressing   Problem: Coping: Goal: Level of anxiety will decrease Outcome: Progressing   Problem: Elimination: Goal: Will not experience complications related to bowel motility Outcome: Progressing Goal: Will not experience complications related to urinary retention Outcome: Progressing   Problem: Pain Managment: Goal: General experience of comfort will improve and/or be controlled Outcome: Progressing   Problem: Safety: Goal: Ability to remain free from injury will improve Outcome: Progressing   Problem: Skin Integrity: Goal: Risk for impaired skin integrity will decrease Outcome: Progressing   Problem: Education: Goal: Understanding of CV disease, CV risk reduction, and recovery process will improve Outcome: Progressing Goal: Individualized Educational Video(s) Outcome: Progressing   Problem: Activity: Goal: Ability to return to baseline activity level will improve Outcome: Progressing   Problem: Cardiovascular: Goal: Ability to achieve and maintain adequate  cardiovascular perfusion will improve Outcome: Progressing Goal: Vascular access site(s) Level 0-1 will be maintained Outcome: Progressing   Problem: Health Behavior/Discharge Planning: Goal: Ability to safely manage health-related needs after discharge will improve Outcome: Progressing  Roosevelt Locks, RN

## 2023-06-11 NOTE — ED Notes (Signed)
 Called main lab; added troponin.

## 2023-06-11 NOTE — ED Notes (Signed)
 Cardiology at bedside.

## 2023-06-11 NOTE — ED Notes (Signed)
 1st lac 0.71 in normal range, 2nd not needed

## 2023-06-11 NOTE — ED Provider Notes (Addendum)
 [place] EMERGENCY DEPARTMENT AT [facility] Provider Note   CSN: [id number] Arrival date & time: 06/10/23  2207   History  Chief Complaint  Patient presents with   Abdominal Pain   Jeremy Hill is a 78 y.o. male who presented the emergency department with concern for nearly 24 hours of generalized bilateral abdominal pain that he woke with on 06/10/2023.  Nothing similar in the past.  Pain is like pressure and he feels his belly is distended.  No chest pain shortness of breath palpitations, nausea vomiting diarrhea fevers chills, urinary symptoms.  Patient has a history of prostate cancer with brachytherapy, history of hypertension, hyperlipidemia.  No recent medication such as Cialis, Viagra.  Family history positive for both parents with CABG in the past.  No history of cardiac anomaly per patient.  Notably hypertensive on intake, outpatient blood pressures have been reassuring in the past few months. HPI     Home Medications Prior to Admission medications   Medication Sig Start Date End Date Taking? Authorizing Provider  amLODipine (NORVASC) 10 MG tablet Take 1 tablet (10 mg total) by mouth daily. 08/19/21   Briscoe Burns, MD  clobetasol ointment (TEMOVATE) 0.05 %  10/28/20   [provider]  hydrochlorothiazide (HYDRODIURIL) 25 MG tablet TAKE 1 TABLET(25 MG) BY MOUTH DAILY 11/13/22   Tyson Alias, MD  HYDROcodone-acetaminophen [facility]) 10-325 MG tablet Take 1 tablet by mouth every 8 (eight) hours as needed for severe pain (pain score 7-10). 05/13/23 05/11/24  Mercie Eon, MD  lisinopril (ZESTRIL) 40 MG tablet Take 1 tablet (40 mg total) by mouth daily. 01/13/23   Inez Catalina, MD  rosuvastatin (CRESTOR) 20 MG tablet Take 1 tablet (20 mg total) by mouth daily. 04/28/22 04/28/23  Inez Catalina, MD  spironolactone (ALDACTONE) 25 MG tablet TAKE 1 TABLET(25 MG) BY MOUTH DAILY 10/20/21   Inez Catalina, MD      Allergies    Oxycodone    Review of  Systems   Review of Systems  Constitutional: Negative.   Respiratory: Negative.    Cardiovascular: Negative.   Gastrointestinal:  Positive for abdominal pain. Negative for constipation, diarrhea, nausea, rectal pain and vomiting.  Genitourinary: Negative.   Neurological: Negative.     Physical Exam Updated Vital Signs BP (!) 148/92   Pulse (!) 55   Temp 98.7 F (37.1 C)   Resp 13   SpO2 95%  Physical Exam Vitals and nursing note reviewed.  Constitutional:      Appearance: He is not ill-appearing or toxic-appearing.  HENT:     Head: Normocephalic and atraumatic.     Mouth/Throat:     Mouth: Mucous membranes are moist.     Pharynx: No oropharyngeal exudate or posterior oropharyngeal erythema.  Eyes:     General:        Right eye: No discharge.        Left eye: No discharge.     Conjunctiva/sclera: Conjunctivae normal.  Cardiovascular:     Rate and Rhythm: Normal rate and regular rhythm.     Pulses: Normal pulses.     Heart sounds: Normal heart sounds.  Pulmonary:     Effort: Pulmonary effort is normal. No respiratory distress.     Breath sounds: Normal breath sounds. No wheezing or rales.  Abdominal:     General: Bowel sounds are normal. There is distension.     Palpations: Abdomen is soft.     Tenderness: There is no abdominal  tenderness. There is no right CVA tenderness, left CVA tenderness, guarding or rebound.  Musculoskeletal:        General: No deformity.     Cervical back: Neck supple.  Skin:    General: Skin is warm and dry.     Capillary Refill: Capillary refill takes less than 2 seconds.  Neurological:     General: No focal deficit present.     Mental Status: He is alert and oriented to person, place, and time. Mental status is at baseline.  Psychiatric:        Mood and Affect: Mood normal.     ED Results / Procedures / Treatments   Labs (all labs ordered are listed, but only abnormal results are displayed) Labs Reviewed  COMPREHENSIVE METABOLIC  PANEL WITH GFR - Abnormal; Notable for the following components:      Result Value   Glucose, Bld 111 (*)    Total Bilirubin 1.3 (*)    All other components within normal limits  CBC - Abnormal; Notable for the following components:   Hemoglobin 12.1 (*)    HCT 38.0 (*)    All other components within normal limits  LIPID PANEL - Abnormal; Notable for the following components:   LDL Cholesterol 105 (*)    All other components within normal limits  BASIC METABOLIC PANEL WITH GFR - Abnormal; Notable for the following components:   Sodium 134 (*)    Glucose, Bld 106 (*)    All other components within normal limits  CBC WITH DIFFERENTIAL/PLATELET - Abnormal; Notable for the following components:   RBC 4.15 (*)    Hemoglobin 11.7 (*)    HCT 36.0 (*)    All other components within normal limits  GLUCOSE, CAPILLARY - Abnormal; Notable for the following components:   Glucose-Capillary 100 (*)    All other components within normal limits  TROPONIN I (HIGH SENSITIVITY) - Abnormal; Notable for the following components:   Troponin I (High Sensitivity) 69 (*)    All other components within normal limits  TROPONIN I (HIGH SENSITIVITY) - Abnormal; Notable for the following components:   Troponin I (High Sensitivity) 66 (*)    All other components within normal limits  TROPONIN I (HIGH SENSITIVITY) - Abnormal; Notable for the following components:   Troponin I (High Sensitivity) 61 (*)    All other components within normal limits  MRSA NEXT GEN BY PCR, NASAL  LIPASE, BLOOD  URINALYSIS, ROUTINE W REFLEX MICROSCOPIC  PROTIME-INR  HEMOGLOBIN A1C  LIPOPROTEIN A (LPA)  I-STAT CG4 LACTIC ACID, ED  POCT ACTIVATED CLOTTING TIME  I-STAT CG4 LACTIC ACID, ED    EKG  EKG Interpretation Date/Time:  Friday June 11 2023 01:38:55 EDT Ventricular Rate:  68 PR Interval:  190 QRS Duration:  96 QT Interval:  416 QTC Calculation: 443 R Axis:   -15  Text Interpretation: Sinus rhythm Borderline left  axis deviation Low voltage, precordial leads Abnormal T, consider ischemia, diffuse leads ST elevation, consider lateral injury Confirmed by Nicanor Alcon, April ([id number]) on 06/11/2023 1:42:24 AM  Radiology CARDIAC CATHETERIZATION Result Date: 06/11/2023   Lat 2nd Mrg lesion is 100% stenosed. 1.  Torturous right upper extremity and radial loop requiring destination sheath for coronary visualization. 2.  Tortuous but patent coronary arteries; there may be a distal vessel cutoff of a very small subbranch of the obtuse marginal.  Trend troponins and if suggestive consider dual antiplatelet therapy with Plavix for 6 months. 3.  LVEDP of 28 mmHg with ejection  fraction around 50%. Recommendation: Medical therapy with focus on aggressive blood pressure control.   CT Angio Chest/Abd/Pel for Dissection W and/or Wo Contrast Result Date: 06/11/2023 CLINICAL DATA:  Chest pain epigastric pain EXAM: CT ANGIOGRAPHY CHEST, ABDOMEN AND PELVIS TECHNIQUE: Non-contrast CT of the chest was initially obtained. Multidetector CT imaging through the chest, abdomen and pelvis was performed using the standard protocol during bolus administration of intravenous contrast. Multiplanar reconstructed images and MIPs were obtained and reviewed to evaluate the vascular anatomy. RADIATION DOSE REDUCTION: This exam was performed according to the departmental dose-optimization program which includes automated exposure control, adjustment of the mA and/or kV according to patient size and/or use of iterative reconstruction technique. CONTRAST:  OMNIPAQUE IOHEXOL 350 MG/ML SOLN COMPARISON:  Chest x-ray 06/11/2023 FINDINGS: CTA CHEST FINDINGS Cardiovascular: Non contrasted images of the chest demonstrate no acute intramural hematoma. No dissection is seen. Aorta is nonaneurysmal. Common origin of the right brachiocephalic and left common carotid arteries. Mild stenosis at the origin of the left subclavian artery. Cardiomegaly. No pericardial effusion  Mediastinum/Nodes: Patent trachea. No thyroid mass. No suspicious lymph nodes. Esophagus within normal limits Lungs/Pleura: No acute airspace disease, pleural effusion or pneumothorax. Atelectasis at the bases Musculoskeletal: Sternum appears intact. Multilevel degenerative change. No acute osseous abnormality Review of the MIP images confirms the above findings. CTA ABDOMEN AND PELVIS FINDINGS VASCULAR Aorta: Normal caliber aorta without aneurysm, dissection, vasculitis or significant stenosis. Celiac: No dissection or occlusion. Mild aneurysmal dilatation of the proximal celiac artery about a cm distal to the origin measuring up to 10 mm. SMA: Patent without evidence of aneurysm or dissection. Suspicion of wall thickening of the SMA but without focal stenosis. Mild diffuse enlargement of the distal SMA, coronal 11 image 95. Renals: Both renal arteries are patent without evidence of aneurysm, dissection, vasculitis, fibromuscular dysplasia or significant stenosis. IMA: Patent without evidence of aneurysm, dissection, vasculitis or significant stenosis. Inflow: Patent without evidence of aneurysm, dissection, vasculitis or significant stenosis. Veins: Suboptimally assessed Review of the MIP images confirms the above findings. NON-VASCULAR Hepatobiliary: No focal liver abnormality is seen. No gallstones, gallbladder wall thickening, or biliary dilatation. Pancreas: Unremarkable. No pancreatic ductal dilatation or surrounding inflammatory changes. Spleen: Normal in size without focal abnormality. Adrenals/Urinary Tract: Adrenal glands are unremarkable. Kidneys are normal, without renal calculi, focal lesion, or hydronephrosis. Bladder is slightly thick walled with mild perivesical stranding. Stomach/Bowel: Stomach is within normal limits. Appendix appears normal. No evidence of bowel wall thickening, distention, or inflammatory changes. Lymphatic: No suspicious lymph nodes Reproductive: Multiple prostate seeds Other:  Negative for pelvic effusion or free air. Moderate fat containing left inguinal hernia. Musculoskeletal: Degenerative changes. No acute osseous abnormality. Review of the MIP images confirms the above findings. IMPRESSION: 1. Negative for acute aortic dissection or aneurysm. 2. 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 3. Cardiomegaly. 4. Slight bladder wall thickening with mild perivesical stranding, correlate for cystitis. 5. Moderate fat containing left inguinal hernia. Electronically Signed   By: Jasmine Pang M.D.   On: 06/11/2023 02:10   DG Chest Portable 1 View Result Date: 06/11/2023 CLINICAL DATA:  Painetvc  Encounter for pain, epigastric pain EXAM: PORTABLE CHEST 1 VIEW COMPARISON:  None Available.  Chest x-ray 08/11/2018 FINDINGS: The heart and mediastinal contours are unchanged. Left base atelectasis. No focal consolidation. No pulmonary edema. No pleural effusion. No pneumothorax. No acute osseous abnormality. IMPRESSION: No active disease. Electronically Signed   By: Normajean Glasgow.D.  On: 06/11/2023 01:36    Procedures .Critical Care  Performed by: Paris Lore, PA-C Authorized by: Paris Lore, PA-C   Critical care provider statement:    Critical care time (minutes):  60   Critical care was necessary to treat or prevent imminent or life-threatening deterioration of the following conditions:  Cardiac failure   Critical care was time spent personally by me on the following activities:  Development of treatment plan with patient or surrogate, discussions with consultants, evaluation of patient's response to treatment, examination of patient, obtaining history from patient or surrogate, ordering and performing treatments and interventions, ordering and review of laboratory studies, ordering and review of radiographic studies, pulse oximetry and re-evaluation of patient's condition     Medications Ordered in ED Medications   labetalol (NORMODYNE) injection 10 mg (10 mg Intravenous Given 06/11/23 0337)  hydrALAZINE (APRESOLINE) injection 10 mg (has no administration in time range)  acetaminophen (TYLENOL) tablet 650 mg (has no administration in time range)  ondansetron (ZOFRAN) injection 4 mg (has no administration in time range)  sodium chloride flush (NS) 0.9 % injection 3 mL (has no administration in time range)  sodium chloride flush (NS) 0.9 % injection 3 mL (has no administration in time range)  0.9 %  sodium chloride infusion (has no administration in time range)  aspirin EC tablet 81 mg (has no administration in time range)  amLODipine (NORVASC) tablet 10 mg (has no administration in time range)  rosuvastatin (CRESTOR) tablet 20 mg (has no administration in time range)  spironolactone (ALDACTONE) tablet 25 mg (has no administration in time range)  hydrochlorothiazide (HYDRODIURIL) tablet 25 mg (25 mg Oral Given 06/11/23 0348)  lisinopril (ZESTRIL) tablet 40 mg (40 mg Oral Given 06/11/23 0348)  senna (SENOKOT) tablet 8.6 mg (has no administration in time range)  iohexol (OMNIPAQUE) 350 MG/ML injection 100 mL (100 mLs Intravenous Contrast Given 06/11/23 0135)  aspirin tablet 325 mg (325 mg Oral Given 06/11/23 0148)  atorvastatin (LIPITOR) tablet 80 mg (80 mg Oral Given 06/11/23 0148)  fentaNYL (SUBLIMAZE) injection 50 mcg (50 mcg Intravenous Given 06/11/23 0149)  heparin injection 4,000 Units (4,000 Units Intravenous Given 06/11/23 0155)  nitroGLYCERIN (NITROGLYN) 2 % ointment 0.5 inch (0.5 inches Topical Given 06/11/23 0212)  0.9 %  sodium chloride infusion (10 mL/hr Intravenous New Bag/Given 06/11/23 0236)    ED Course/ Medical Decision Making/ A&P Clinical Course as of 06/11/23 0440  Fri Jun 11, 2023  0141 Dr. Karilyn Cota, cardiology fellow, has evaluated the EKGs and the patient and recommends activating CODE STEMI. Page placed. Hold heparin until CTA resulted. [RS]  0210 Dr. Lynnette Caffey, interventionalist, is  en route to the ED now to transport patient to the cath lab.  [RS]  0217 Dr. Lynnette Caffey at the bedside, patient being transported to the cath lab.  [RS]  0222 Patient to cath lab 1. [RS]    Clinical Course User Index [RS] Janecia Palau, Eugene Gavia, PA-C                                 Medical Decision Making 78 year old male with abdominal pain.  Hypertensive on intake, vital signs otherwise normal.  Cardiopulmonary exam is unremarkable, abdominal exam with mild distention of the abdomen but otherwise nonfocal.  Normal neurovascular status in the extremities.  Clinical concern upon presentation to the bedside due to review EKG performed in triage approximately 3 hours prior to the patient being bedded  in the emergency department, and prior to my evaluation of the patient.  Unfortunately his EKG did reveal significant lateral ST elevation with reciprocal changes concerning for STEMI.  STEMI was not activated by EDP at time of triage EKG.  Repeat EKG at time of my initial evaluation approximately 3 hours following the initial EKG showed persistent changes, however patient remained completely chest pain-free.  This prompted stat cardiology consult by cardiology fellow Dr. Juel Burrow as above who expressed significant concern and recommends activating code STEMI.  Prior to activation of code STEMI patient did undergo CT angiogram for dissection study which was negative aside from question of cystitis.  Please see ED course as above for time stamps  Amount and/or Complexity of Data Reviewed Labs: ordered.    Details: CBC with hemoglobin of 12 near patient's baseline.  CMP with mild elevation in total bili to 1.3.  UA unremarkable.  Coags are normal, lipase is normal, lactic is negative.  Troponin elevated to 69 and then 66.   Radiology: ordered.    Details: STEMI  Risk OTC drugs. Prescription drug management. Decision regarding hospitalization.    Patient remained hypertensive but hemodynamically  stable and chest pain-free throughout his time in the emergency department and was transported emergently to the catheterization lab accompanied by interventional cardiologist Dr. Lynnette Caffey.  Azeez  voiced understanding of his medical evaluation and treatment plan. Each of their questions answered to their expressed satisfaction.   This chart was dictated using voice recognition software, Dragon. Despite the best efforts of this provider to proofread and correct errors, errors may still occur which can change documentation meaning.    Final Clinical Impression(s) / ED Diagnoses Final diagnoses:  ST elevation myocardial infarction (STEMI), unspecified artery [facility])    Rx / DC Orders ED Discharge Orders          Ordered    AMB referral to Phase II Cardiac Rehabilitation        06/11/23 0315              Elaina Cara, Eugene Gavia, PA-C 06/11/23 0441    Palumbo, April, MD 06/11/23 0504    Paris Lore, PA-C 06/11/23 2237    Palumbo, April, MD 06/11/23 2334

## 2023-06-11 NOTE — Progress Notes (Signed)
 eLink Physician-Brief Progress Note Patient Name: Jeremy Hill DOB: 10-24-1945 MRN: [id number]   Date of Service  06/11/2023  HPI/Events of Note  Patient admitted with a Code STEMI by cardiology.  eICU Interventions  New Patient Evaluation.        Thomasene Lot Dailen Mcclish 06/11/2023, 4:09 AM

## 2023-06-11 NOTE — TOC Initial Note (Signed)
 Transition of Care [facility]) - Initial/Assessment Note    Patient Details  Name: Jeremy Hill MRN: [id number] Date of Birth: [birth year]  [facility] CM/SW Contact:    Elliot Cousin, RN Phone Number:[phone] 06/11/2023, 5:48 PM  Clinical Narrative:                 TOC CM spoke to pt at bedside. Pt states he is independent at home. He drives to his appts. Sister is his emergency contact. Gave permission to speak to sister.   Will continue to follow for dc needs.   Expected Discharge Plan: Home/Self Care Barriers to Discharge: No Barriers Identified   Patient Goals and CMS Choice Patient states their goals for this hospitalization and ongoing recovery are:: wants to remain independent          Expected Discharge Plan and Services   Discharge Planning Services: CM Consult   Living arrangements for the past 2 months: Single Family Home                                      Prior Living Arrangements/Services Living arrangements for the past 2 months: Single Family Home Lives with:: Self Patient language and need for interpreter reviewed:: Yes Do you feel safe going back to the place where you live?: Yes      Need for Family Participation in Patient Care: No (Comment) Care giver support system in place?: No (comment)   Criminal Activity/Legal Involvement Pertinent to Current Situation/Hospitalization: No - Comment as needed  Activities of Daily Living   ADL Screening (condition at time of admission) Independently performs ADLs?: Yes (appropriate for developmental age) Is the patient deaf or have difficulty hearing?: No Does the patient have difficulty seeing, even when wearing glasses/contacts?: No Does the patient have difficulty concentrating, remembering, or making decisions?: No  Permission Sought/Granted Permission sought to share information with : Case Manager, Family Supports, PCP Permission granted to share information with : Yes,  Verbal Permission Granted  Share Information with NAME: Gilles Chiquito     Permission granted to share info w Relationship: sister  Permission granted to share info w Contact Information: [phone]  Emotional Assessment Appearance:: Appears stated age Attitude/Demeanor/Rapport: Engaged Affect (typically observed): Accepting Orientation: : Oriented to Self, Oriented to Place, Oriented to  Time, Oriented to Situation   Psych Involvement: No (comment)  Admission diagnosis:  ST elevation myocardial infarction (STEMI), unspecified artery (HCC) [I21.3] STEMI (ST elevation myocardial infarction) [facility]) [I21.3] Patient Active Problem List   Diagnosis Date Noted   STEMI (ST elevation myocardial infarction) (HCC) 06/11/2023   Syphilis in male 08/14/2022   Malignant neoplasm of prostate (HCC) 06/08/2018   Overweight (BMI 25.0-29.9) 04/16/2017   HLD (hyperlipidemia) 03/17/2017   Central retinal vein occlusion, left eye 09/18/2015   BPH (benign prostatic hyperplasia) 05/11/2015   Psoriasis 12/12/2012   Routine health maintenance 04/02/2011   Essential hypertension 12/17/2005   Spinal stenosis 12/17/2005   PCP:  Mercie Eon, MD Pharmacy:   [facility] [phone] Ginette Otto, [place] - [address] AT [facility] [address] [facility] [address] [place] [address] Phone: [phone] Fax: [phone]     Social Drivers of Health (SDOH) Social History: SDOH Screenings   Food Insecurity: Food Insecurity Present (06/11/2023)  Housing: Low Risk  (06/11/2023)  Transportation Needs: No Transportation Needs (06/11/2023)  Utilities: Not  At Risk (06/11/2023)  Alcohol Screen: Low Risk  (04/17/2022)  Depression (PHQ2-9): Low Risk  (01/13/2023)  Financial Resource Strain: Low Risk  (04/17/2022)  Physical Activity: Patient Declined (04/17/2022)  Social Connections: Moderately Integrated (06/11/2023)  Stress: No Stress Concern Present (04/17/2022)  Tobacco Use: Low Risk   (06/11/2023)   SDOH Interventions:     Readmission Risk Interventions     No data to display

## 2023-06-11 NOTE — Progress Notes (Signed)
   06/11/23 0240  Spiritual Encounters  Type of Visit Attempt (pt unavailable)  Reason for visit Code  OnCall Visit Yes       Responded to code stemi

## 2023-06-11 NOTE — Progress Notes (Signed)
  Echocardiogram 2D Echocardiogram has been performed.  Rosemary Holms, RDCS 06/11/2023, 3:09 PM

## 2023-06-11 NOTE — Progress Notes (Signed)
 Rounding Note    Patient Name: Jeremy Hill Date of Encounter: 06/11/2023  [facility] Cardiologist: Orbie Pyo, MD   Subjective   Patient feels improved without recurrent chest pain.  Inpatient Medications    Scheduled Meds:  amLODipine  10 mg Oral Daily   [START ON 06/12/2023] aspirin EC  81 mg Oral Daily   hydrochlorothiazide  25 mg Oral Daily   lisinopril  40 mg Oral Daily   rosuvastatin  20 mg Oral Daily   senna  1 tablet Oral Daily   sodium chloride flush  3 mL Intravenous Q12H   spironolactone  25 mg Oral Daily   Continuous Infusions:  sodium chloride     PRN Meds: sodium chloride, acetaminophen, ondansetron (ZOFRAN) IV, mouth rinse, sodium chloride flush   Vital Signs    Vitals:   06/11/23 0645 06/11/23 0700 06/11/23 0800 06/11/23 1130  BP: (!) 135/96 (!) 145/95 (!) 134/96   Pulse: 67 (!) 53 (!) 52   Resp: 13 14 16    Temp:   97.6 F (36.4 C) 97.6 F (36.4 C)  TempSrc:   Oral Oral  SpO2: 97% 96% 97%   Weight:        Intake/Output Summary (Last 24 hours) at 06/11/2023 1238 Last data filed at 06/11/2023 1100 Gross per 24 hour  Intake 36.86 ml  Output 500 ml  Net -463.14 ml      06/11/2023    3:30 AM 01/13/2023    8:53 AM 08/28/2022    8:30 AM  Last 3 Weights  Weight (lbs) 220 lb 7.4 oz 217 lb 212 lb 6.4 oz  Weight (kg) 100 kg 98.431 kg 96.344 kg      Telemetry    Sinus rhythm in the 70s with isolated PACs  ECG    ECG (independently read by me): Normal sinus rhythm at 68 bpm.  ST elevation in leads I and aVL, T wave inversion V3 through V6 consistent with lateral MI.  Physical Exam   BP (!) 134/96 (BP Location: Left Arm)   Pulse (!) 52   Temp 97.6 F (36.4 C) (Oral)   Resp 16   Wt 100 kg   SpO2 97%   BMI 29.09 kg/m  General: Alert, oriented, no distress.  Skin: normal turgor, no rashes, warm and dry HEENT: Normocephalic, atraumatic. Nose without nasal septal hypertrophy Mouth/Parynx benign; Mallinpatti scale  3 Neck: No JVD, no carotid bruits; normal carotid upstroke Lungs: clear to ausculatation and percussion; no wheezing or rales Chest wall: without tenderness to palpitation Heart: PMI not displaced, RRR, s1 s2 normal, 1/6 systolic murmur, no diastolic murmur, no rubs, gallops, thrills, or heaves Abdomen: soft, nontender; no hepatosplenomehaly, BS+; abdominal aorta nontender and not dilated by palpation. Back: no CVA tenderness Pulses 2+ right radial cath site stable; no hematoma. Musculoskeletal: full range of motion, normal strength, no joint deformities Extremities: no clubbing cyanosis or edema, Homan's sign negative  Neurologic: grossly nonfocal; Cranial nerves grossly wnl Psychologic: Normal mood and affect    . High Sensitivity Troponin:   Recent Labs  Lab 06/10/23 2210 06/11/23 0220 06/11/23 0348  TROPONINIHS 69* 66* 61*     Chemistry Recent Labs  Lab 06/10/23 2220 06/11/23 0348  NA 136 134*  K 3.8 3.5  CL 99 100  CO2 27 25  GLUCOSE 111* 106*  BUN 17 16  CREATININE 1.18 1.13  CALCIUM 9.6 8.9  PROT 7.7  --   ALBUMIN 3.9  --   AST  21  --   ALT 17  --   ALKPHOS 53  --   BILITOT 1.3*  --   GFRNONAA >60 >60  ANIONGAP 10 9    Lipids  Recent Labs  Lab 06/11/23 0220  CHOL 167  TRIG 31  HDL 56  LDLCALC 105*  CHOLHDL 3.0    Hematology Recent Labs  Lab 06/10/23 2220 06/11/23 0348  WBC 9.6 7.3  RBC 4.30 4.15*  HGB 12.1* 11.7*  HCT 38.0* 36.0*  MCV 88.4 86.7  MCH 28.1 28.2  MCHC 31.8 32.5  RDW 13.6 13.6  PLT 238 220   Thyroid No results for input(s): "TSH", "FREET4" in the last 168 hours.  BNPNo results for input(s): "BNP", "PROBNP" in the last 168 hours.  DDimer No results for input(s): "DDIMER" in the last 168 hours.   Radiology    CARDIAC CATHETERIZATION Result Date: 06/11/2023   Lat 2nd Mrg lesion is 100% stenosed. 1.  Torturous right upper extremity and radial loop requiring destination sheath for coronary visualization. 2.  Tortuous but  patent coronary arteries; there may be a distal vessel cutoff of a very small subbranch of the obtuse marginal.  Trend troponins and if suggestive consider dual antiplatelet therapy with Plavix for 6 months. 3.  LVEDP of 28 mmHg with ejection fraction around 50%. Recommendation: Medical therapy with focus on aggressive blood pressure control.   CT Angio Chest/Abd/Pel for Dissection W and/or Wo Contrast Result Date: 06/11/2023 CLINICAL DATA:  Chest pain epigastric pain EXAM: CT ANGIOGRAPHY CHEST, ABDOMEN AND PELVIS TECHNIQUE: Non-contrast CT of the chest was initially obtained. Multidetector CT imaging through the chest, abdomen and pelvis was performed using the standard protocol during bolus administration of intravenous contrast. Multiplanar reconstructed images and MIPs were obtained and reviewed to evaluate the vascular anatomy. RADIATION DOSE REDUCTION: This exam was performed according to the departmental dose-optimization program which includes automated exposure control, adjustment of the mA and/or kV according to patient size and/or use of iterative reconstruction technique. CONTRAST:  OMNIPAQUE IOHEXOL 350 MG/ML SOLN COMPARISON:  Chest x-ray 06/11/2023 FINDINGS: CTA CHEST FINDINGS Cardiovascular: Non contrasted images of the chest demonstrate no acute intramural hematoma. No dissection is seen. Aorta is nonaneurysmal. Common origin of the right brachiocephalic and left common carotid arteries. Mild stenosis at the origin of the left subclavian artery. Cardiomegaly. No pericardial effusion Mediastinum/Nodes: Patent trachea. No thyroid mass. No suspicious lymph nodes. Esophagus within normal limits Lungs/Pleura: No acute airspace disease, pleural effusion or pneumothorax. Atelectasis at the bases Musculoskeletal: Sternum appears intact. Multilevel degenerative change. No acute osseous abnormality Review of the MIP images confirms the above findings. CTA ABDOMEN AND PELVIS FINDINGS VASCULAR Aorta:  Normal caliber aorta without aneurysm, dissection, vasculitis or significant stenosis. Celiac: No dissection or occlusion. Mild aneurysmal dilatation of the proximal celiac artery about a cm distal to the origin measuring up to 10 mm. SMA: Patent without evidence of aneurysm or dissection. Suspicion of wall thickening of the SMA but without focal stenosis. Mild diffuse enlargement of the distal SMA, coronal 11 image 95. Renals: Both renal arteries are patent without evidence of aneurysm, dissection, vasculitis, fibromuscular dysplasia or significant stenosis. IMA: Patent without evidence of aneurysm, dissection, vasculitis or significant stenosis. Inflow: Patent without evidence of aneurysm, dissection, vasculitis or significant stenosis. Veins: Suboptimally assessed Review of the MIP images confirms the above findings. NON-VASCULAR Hepatobiliary: No focal liver abnormality is seen. No gallstones, gallbladder wall thickening, or biliary dilatation. Pancreas: Unremarkable. No pancreatic ductal dilatation or surrounding inflammatory  changes. Spleen: Normal in size without focal abnormality. Adrenals/Urinary Tract: Adrenal glands are unremarkable. Kidneys are normal, without renal calculi, focal lesion, or hydronephrosis. Bladder is slightly thick walled with mild perivesical stranding. Stomach/Bowel: Stomach is within normal limits. Appendix appears normal. No evidence of bowel wall thickening, distention, or inflammatory changes. Lymphatic: No suspicious lymph nodes Reproductive: Multiple prostate seeds Other: Negative for pelvic effusion or free air. Moderate fat containing left inguinal hernia. Musculoskeletal: Degenerative changes. No acute osseous abnormality. Review of the MIP images confirms the above findings. IMPRESSION: 1. Negative for acute aortic dissection or aneurysm. 2. 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 3. Cardiomegaly. 4. Slight bladder wall thickening with  mild perivesical stranding, correlate for cystitis. 5. Moderate fat containing left inguinal hernia. Electronically Signed   By: Jasmine Pang M.D.   On: 06/11/2023 02:10   DG Chest Portable 1 View Result Date: 06/11/2023 CLINICAL DATA:  Painetvc  Encounter for pain, epigastric pain EXAM: PORTABLE CHEST 1 VIEW COMPARISON:  None Available.  Chest x-ray 08/11/2018 FINDINGS: The heart and mediastinal contours are unchanged. Left base atelectasis. No focal consolidation. No pulmonary edema. No pleural effusion. No pneumothorax. No acute osseous abnormality. IMPRESSION: No active disease. Electronically Signed   By: Tish Frederickson M.D.   On: 06/11/2023 01:36    Cardiac Studies   CATH: Lat 2nd Mrg lesion is 100% stenosed.   1.  Torturous right upper extremity and radial loop requiring destination sheath for coronary visualization. 2.  Tortuous but patent coronary arteries; there may be a distal vessel cutoff of a very small subbranch of the obtuse marginal.  Trend troponins and if suggestive consider dual antiplatelet therapy with Plavix for 6 months. 3.  LVEDP of 28 mmHg with ejection fraction around 50%.   Recommendation: Medical therapy with focus on aggressive blood pressure control.    Patient Profile     MASAJI BILLUPS is a 78 y.o. 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  No significant concomitant CAD.  Plan DAPT.  Current heart rate 72 with PACs, troponins minimally elevated at 69 > 66 > 61.  Will add post MI beta-blocker therapy which should help with ectopy suppression and initiate metoprolol succinate 25 mg daily.  Echo Doppler study planned for today, not yet done.  Medical therapy for MI; intervention not performed. Hypertension: Blood  pressure currently stable but need to monitor.  Prior to admission patient was on amlodipine 10 mg, hydrochlorothiazide 25 mg, lisinopril 40 mg, and spironolactone 25 mg.  Will add beta-blocker.  May be able to reduce or DC HCTZ since on spironolactone.  2D echo Doppler study to be performed today. Hyperlipidemia.  Patient was on rosuvastatin 20 mg.  Check LP(a).  Will titrate rosuvastatin to 40 mg.  Aim for LDL less than 55 or lower if LP(a) elevated. History of prostate cancer followed at [facility]. 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  May be able to transfer up to cardiology telemetry later today or tomorrow.   For questions or updates, please contact [facility] Please consult [email or web address] for contact info under  Signed, Nicki Guadalajara, MD  06/11/2023, 12:38 PM

## 2023-06-11 NOTE — H&P (Signed)
 Cardiology Admission History and Physical   Patient ID: VYOM BRASS MRN: [id number]; DOB: May 30, 1945   Admission date: 06/10/2023  PCP:  Mercie Eon, MD   [facility] Providers Cardiologist:  None       Chief Complaint:  upper quadrant abdominal pain  Patient Profile:   Jeremy Hill is a 78 y.o. 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  History of Present Illness:   No prior history of CAD or prior heart attack. On 06/10/23, he started having severe 10/10 upper abdominal pain extending from the RUQ to the LUQ. No associated N/V/D. He went to urgent care for evaluation and they sent him to the ED. In the ED, an EKG was performed at 10:22pm, showing ST elevation in aVR and TWI in the lateral leads. Repeat EKG at 06/11/23 01:15am showing ST elevations in lead I and aVL and TWI with ST depression in lateral and inferior leads for which he was sent for a CTA abdomen to rule out aortic dissection and cardiology was consulted. After confirming no dissection on CTA, he was given ASA 324mg  PO x1, atorvastatin 80mg  x1, and heparin 4000u IV, and a code STEMI was paged out for emergent catheterization.  Denies prior tobacco use, prior MI or stroke, or history of diabetes. Add-on troponin to his 10pm labs resulting 69 prior to transfer to the cath lab.  Past Medical History:  Diagnosis Date   Anemia 2008   borderline low Hg/Hct = 12.6/39.6 (01/10/2007), no anemia panel available and patient had refused colonoscopy, last colonoscopy done was in 2005 and the results were normal, done by Dr. Loreta Ave   Depression    Hypertension    Neutropenia 2008   noted on cbc (01/10/2007) WBC = 3.9, also CBC done 08/2006 showed WBC = 3.0, unclear etiology; CXR done 08/2006 showed questionable right lung nodule and the follow up CT was recommended, there is no data in the EChart that it  was done   Osteomyelitis (HCC) 12/2001   per MRI of the spine - GIVEN THE ABNORMALITY AT THE T8-9 LEVEL, INFECTION AT THESE LATTER REGIONS CANNOT BE COMPLETELY EXCLUDED AND  WILL NEED TO BE FOLLOWED CLOSELY. SCATTERED DEGENERATIVE CHANGES IN THE LOWER  THORACIC/LUMBAR SPINE  AT THE L4-5 SIGNIFICANT NEURAL FORAMINAL NARROWING (R>L).  DECREASED SIGNAL INTENSITY OF BONE MARROW. UNDERLYING ANEMIA/INFILTRATIVE PROCESS/ LYMPHOMA.    Osteomyelitis, chronic (HCC) 2004   persistant osteomyelitis per MRI 04/2002 and also progressive at the same level as in 2003 T8-9 level   Prostate cancer [facility])     Past Surgical History:  Procedure Laterality Date   CYSTOSCOPY N/A 10/28/2018   Procedure: CYSTOSCOPY;  Surgeon: Ihor Gully, MD;  Location: [facility];  Service: Urology;  Laterality: N/A;  No seeds seen per Dr. Vernie Ammons   HERNIA REPAIR     LYMPH NODE BIOPSY  01/2002   right inguinal node biopsy (done secondary to finding of neutropenia and lymphadenopathy) -  REACTIVE LYMPHOID HYPERPLASIA WITH SINUS HISTIOCYTOSIS AND PLASMACYTOSIS, no evidence of malignancy   PROSTATE BIOPSY     RADIOACTIVE SEED IMPLANT N/A 10/28/2018   Procedure: RADIOACTIVE SEED IMPLANT/BRACHYTHERAPY IMPLANT;  Surgeon: Ihor Gully, MD;  Location: [facility];  Service: Urology;  Laterality: N/A;   RHINOPLASTY     SPACE OAR INSTILLATION N/A 10/28/2018   Procedure: SPACE OAR INSTILLATION;  Surgeon: Ihor Gully, MD;  Location: [facility];  Service: Urology;  Laterality: N/A;     Medications Prior to Admission: Prior to Admission medications   Medication Sig Start Date End Date Taking? Authorizing Provider  amLODipine (NORVASC) 10 MG tablet Take 1 tablet (10 mg total) by mouth daily. 08/19/21   Briscoe Burns, MD  clobetasol ointment (TEMOVATE) 0.05 %  10/28/20   [provider]  hydrochlorothiazide (HYDRODIURIL) 25 MG tablet TAKE 1 TABLET(25 MG) BY MOUTH DAILY 11/13/22   Tyson Alias, MD  HYDROcodone-acetaminophen [facility]) 10-325 MG tablet Take 1 tablet by mouth every 8 (eight) hours as needed for severe pain (pain score 7-10). 05/13/23 05/11/24  Mercie Eon, MD  lisinopril (ZESTRIL) 40 MG tablet Take 1 tablet (40 mg total) by mouth daily. 01/13/23   Inez Catalina, MD  rosuvastatin (CRESTOR) 20 MG tablet Take 1 tablet (20 mg total) by mouth daily. 04/28/22 04/28/23  Inez Catalina, MD  spironolactone (ALDACTONE) 25 MG tablet TAKE 1 TABLET(25 MG) BY MOUTH DAILY 10/20/21   Inez Catalina, MD     Allergies:    Allergies  Allergen Reactions   Oxycodone     States makes him nervous and sick.    Social History:   Social History   Socioeconomic History   Marital status: Divorced    Spouse name: Not on file   Number of children: 3   Years of education: Not on file   Highest education level: Not on file  Occupational History    Comment: retired  Tobacco Use   Smoking status: Never   Smokeless tobacco: Never  Vaping Use   Vaping status: Never Used  Substance and Sexual Activity   Alcohol use: No    Alcohol/week: 0.0 standard drinks of alcohol   Drug use: No   Sexual activity: Not Currently  Other Topics Concern   Not on file  Social History Narrative   Recently divorced.    Social Drivers of Corporate investment banker Strain: Low Risk  (04/17/2022)   Overall Financial Resource Strain (CARDIA)    Difficulty of Paying Living Expenses: Not hard at all  Food Insecurity: Food Insecurity Present (04/17/2022)   Hunger Vital Sign    Worried About Running Out of Food in the Last Year: Never true    Ran Out of Food in the Last Year: Sometimes true  Transportation Needs: Unmet Transportation Needs (04/17/2022)   PRAPARE - Administrator, Civil Service (Medical): Yes    Lack of Transportation (Non-Medical): No  Physical Activity: Patient Declined (04/17/2022)   Exercise Vital Sign    Days of Exercise per Week: Patient declined    Minutes of  Exercise per Session: Patient declined  Stress: No Stress Concern Present (04/17/2022)   [employer] of Occupational Health - Occupational Stress Questionnaire    Feeling of Stress : Not at all  Social Connections: Moderately Integrated (04/17/2022)   Social Connection and Isolation Panel [NHANES]    Frequency of Communication with Friends and Family: More than three times a week    Frequency of Social Gatherings with Friends and Family: More than three times a week    Attends Religious Services: More than 4 times per year    Active Member of [employer] or Organizations: Yes    Attends Banker Meetings: Never    Marital Status: Divorced  Catering manager Violence: Not At Risk (04/17/2022)   Humiliation, Afraid, Rape, and Kick questionnaire    Fear  of Current or Ex-Partner: No    Emotionally Abused: No    Physically Abused: No    Sexually Abused: No    Family History:   The patient's family history includes Breast cancer in his sister; Diabetes in his sister; Heart disease in his father and mother; Suicidality in his brother. There is no history of Prostate cancer, Colon cancer, or Pancreatic cancer.    ROS:  Please see the history of present illness.  All other ROS reviewed and negative.     Physical Exam/Data:   Vitals:   06/11/23 0145 06/11/23 0200 06/11/23 0215 06/11/23 0230  BP: (!) 180/102 (!) 185/95 (!) 173/106   Pulse: 68 74 72   Resp: 19 15 16    Temp:      SpO2: 100% 100% 100% 100%   No intake or output data in the 24 hours ending 06/11/23 0244    01/13/2023    8:53 AM 08/28/2022    8:30 AM 04/17/2022   11:24 AM  Last 3 Weights  Weight (lbs) 217 lb 212 lb 6.4 oz 221 lb 8 oz  Weight (kg) 98.431 kg 96.344 kg 100.472 kg     There is no height or weight on file to calculate BMI.  General:  Well nourished, well developed, in no acute distress HEENT: normal Neck: no JVD Vascular: No carotid bruits; Distal pulses 2+ bilaterally   Cardiac:  normal S1, S2;  RRR; no murmur  Lungs:  clear to auscultation bilaterally, no wheezing, rhonchi or rales  Abd: soft, nontender, no hepatomegaly  Ext: no edema Skin: warm and dry  Neuro:  alert and awake, grossly oriented Psych:  Normal affect   EKG:  The ECG that was done 06/10/23 10:22pm was personally reviewed and demonstrates ST elevation in aVR and TWI in the lateral leads. Repeat EKG at 06/11/23 01:15am showing ST elevations in lead I and aVL and TWI with ST depression in lateral and inferior leads. These changes are new from last prior EKG in 08/11/18  Relevant CV Studies: CTA abdomen 06/11/23: 1. Negative for acute aortic dissection or aneurysm. 2. 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 3. Cardiomegaly. 4. Slight bladder wall thickening with mild perivesical stranding, correlate for cystitis. 5. Moderate fat containing left inguinal hernia.  Laboratory Data:  High Sensitivity Troponin:   Recent Labs  Lab 06/10/23 2210  TROPONINIHS 69*      Chemistry Recent Labs  Lab 06/10/23 2220  NA 136  K 3.8  CL 99  CO2 27  GLUCOSE 111*  BUN 17  CREATININE 1.18  CALCIUM 9.6  GFRNONAA >60  ANIONGAP 10    Recent Labs  Lab 06/10/23 2220  PROT 7.7  ALBUMIN 3.9  AST 21  ALT 17  ALKPHOS 53  BILITOT 1.3*   Lipids No results for input(s): "CHOL", "TRIG", "HDL", "LABVLDL", "LDLCALC", "CHOLHDL" in the last 168 hours. Hematology Recent Labs  Lab 06/10/23 2220  WBC 9.6  RBC 4.30  HGB 12.1*  HCT 38.0*  MCV 88.4  MCH 28.1  MCHC 31.8  RDW 13.6  PLT 238   Thyroid No results for input(s): "TSH", "FREET4" in the last 168 hours. BNPNo results for input(s): "BNP", "PROBNP" in the last 168 hours.  DDimer No results for input(s): "DDIMER" in the last 168 hours.   Radiology/Studies:  CT Angio Chest/Abd/Pel for Dissection W and/or Wo Contrast Result Date: 06/11/2023 CLINICAL DATA:  Chest pain epigastric pain EXAM: CT ANGIOGRAPHY CHEST, ABDOMEN AND  PELVIS  TECHNIQUE: Non-contrast CT of the chest was initially obtained. Multidetector CT imaging through the chest, abdomen and pelvis was performed using the standard protocol during bolus administration of intravenous contrast. Multiplanar reconstructed images and MIPs were obtained and reviewed to evaluate the vascular anatomy. RADIATION DOSE REDUCTION: This exam was performed according to the departmental dose-optimization program which includes automated exposure control, adjustment of the mA and/or kV according to patient size and/or use of iterative reconstruction technique. CONTRAST:  OMNIPAQUE IOHEXOL 350 MG/ML SOLN COMPARISON:  Chest x-ray 06/11/2023 FINDINGS: CTA CHEST FINDINGS Cardiovascular: Non contrasted images of the chest demonstrate no acute intramural hematoma. No dissection is seen. Aorta is nonaneurysmal. Common origin of the right brachiocephalic and left common carotid arteries. Mild stenosis at the origin of the left subclavian artery. Cardiomegaly. No pericardial effusion Mediastinum/Nodes: Patent trachea. No thyroid mass. No suspicious lymph nodes. Esophagus within normal limits Lungs/Pleura: No acute airspace disease, pleural effusion or pneumothorax. Atelectasis at the bases Musculoskeletal: Sternum appears intact. Multilevel degenerative change. No acute osseous abnormality Review of the MIP images confirms the above findings. CTA ABDOMEN AND PELVIS FINDINGS VASCULAR Aorta: Normal caliber aorta without aneurysm, dissection, vasculitis or significant stenosis. Celiac: No dissection or occlusion. Mild aneurysmal dilatation of the proximal celiac artery about a cm distal to the origin measuring up to 10 mm. SMA: Patent without evidence of aneurysm or dissection. Suspicion of wall thickening of the SMA but without focal stenosis. Mild diffuse enlargement of the distal SMA, coronal 11 image 95. Renals: Both renal arteries are patent without evidence of aneurysm, dissection, vasculitis,  fibromuscular dysplasia or significant stenosis. IMA: Patent without evidence of aneurysm, dissection, vasculitis or significant stenosis. Inflow: Patent without evidence of aneurysm, dissection, vasculitis or significant stenosis. Veins: Suboptimally assessed Review of the MIP images confirms the above findings. NON-VASCULAR Hepatobiliary: No focal liver abnormality is seen. No gallstones, gallbladder wall thickening, or biliary dilatation. Pancreas: Unremarkable. No pancreatic ductal dilatation or surrounding inflammatory changes. Spleen: Normal in size without focal abnormality. Adrenals/Urinary Tract: Adrenal glands are unremarkable. Kidneys are normal, without renal calculi, focal lesion, or hydronephrosis. Bladder is slightly thick walled with mild perivesical stranding. Stomach/Bowel: Stomach is within normal limits. Appendix appears normal. No evidence of bowel wall thickening, distention, or inflammatory changes. Lymphatic: No suspicious lymph nodes Reproductive: Multiple prostate seeds Other: Negative for pelvic effusion or free air. Moderate fat containing left inguinal hernia. Musculoskeletal: Degenerative changes. No acute osseous abnormality. Review of the MIP images confirms the above findings. IMPRESSION: 1. Negative for acute aortic dissection or aneurysm. 2. 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 3. Cardiomegaly. 4. Slight bladder wall thickening with mild perivesical stranding, correlate for cystitis. 5. Moderate fat containing left inguinal hernia. Electronically Signed   By: Jasmine Pang M.D.   On: 06/11/2023 02:10   DG Chest Portable 1 View Result Date: 06/11/2023 CLINICAL DATA:  Painetvc  Encounter for pain, epigastric pain EXAM: PORTABLE CHEST 1 VIEW COMPARISON:  None Available.  Chest x-ray 08/11/2018 FINDINGS: The heart and mediastinal contours are unchanged. Left base atelectasis. No focal consolidation. No pulmonary edema. No pleural effusion. No  pneumothorax. No acute osseous abnormality. IMPRESSION: No active disease. Electronically Signed   By: Tish Frederickson M.D.   On: 06/11/2023 01:36    Assessment and Plan:   78 y.o. 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  #C/f lateral STEMI #Myocardial injury 2/2 ?myocarditis vs uncontrolled HTN Initially presented with upper abdominal pain with EKG showing new (albeit last EKG from 2020) ST elevation in aVR, lead I, and aVL with TWI and ST depressions in the lateral and inferior leads. Loaded with aspirin 324mg  PO x1, heparin 4000u IV, and atorvastatin 80mg  x1 and taken emergently to the cath lab. Fortunately main coronary arteries without stenosis, potential cut off in the distal Lcx too far for any intervention. At time point in the cath lab, he was no longer having any pain and a lesion that distally would not be expected to cause the EKG changes that he had, so did not intervene. LV gram with normal function, LVEDP elevated at 28 (in the setting of BP 180/100). Plan for medical management. - S/p ASA 324mg  PO x1, continue ASA 81mg  every day - Continue to trend troponin to peak - Management of his BP as below [ ]  f/u TTE in the morning, should troponin continue to rise then would consider cMRI to better evaluate for myocarditis; if severe LVH appreciated on TTE, consider cMRI to evaluate for HCM [ ]  f/u A1c  #HTN Elevated BP on arrival to the ED. His last outpatient notes in 01/2023 note that his BP was 110s/70s on amlodipine, hydrochlorothiazide, lisinopril, and spironolactone. Will resume his home BP meds and titrate as needed - continue home amlodipine 10mg  every day - continue home lisinopril 40mg  every day - continue home spironolactone 25mg  every day - continue home hydrochlorothiazide 25mg  every  day  #Abdominal pain CTA abdomen/pelvis without pathology to explain upper abdominal pain. Pain now resolved. Will continue to monitor.  #HLD - continue home rosuvastatin 20mg  every day [ ]  f/u repeat lipid panel to guide statin titrations  #Spinal stenosis On chronic norco for his spinal stenosis. Not currently complaining of pain, though did receive fentanyl in ED. Will resume home norco if needed.  #H/o syphilis S/p treatment, asymptomatic.  #Prostate cancer Following with [facility], under watchful waiting.  Inpatient bundle - Code: Full - Access: PIVs - GI ppx: none indicated - DVT ppx: S/p heparin IV in ED, now on SCDs - Bowel ppx: senna - Dispo: pending control of his BP and workup for elevated troponin  Risk Assessment/Risk Scores:    TIMI Risk Score for ST  Elevation MI:   The patient's TIMI risk score is 4, which indicates a 7.3% risk of all cause mortality at 30 days.   Code Status: Full Code  Severity of Illness: The appropriate patient status for this patient is INPATIENT. Inpatient status is judged to be reasonable and necessary in order to provide the required intensity of service to ensure the patient's safety. The patient's presenting symptoms, physical exam findings, and initial radiographic and laboratory data in the context of their chronic comorbidities is felt to place them at high risk for further clinical deterioration. Furthermore, it is not anticipated that the patient will be medically stable for discharge from the hospital within 2 midnights of admission.   * I certify that at the point of admission it is my clinical judgment that the patient will require inpatient hospital care spanning beyond 2 midnights from the point of admission due to high intensity of service, high risk for further deterioration and high frequency of surveillance required.*   For questions or updates, please contact [facility] Please consult [email or web address] for  contact info under  Signed, Bella Kennedy, MD  06/11/2023 2:44 AM

## 2023-06-11 NOTE — Progress Notes (Deleted)
 Chaplain responded to a Level 2 Trauma code page. Pt arrived and family has been contacted. Pt's daughter-in-law let know Chaplain her mother was on her way to be with Pt. No further spiritual support needed at this point.  Oneida Alar Chaplain Resident   06/11/23 1647  Spiritual Encounters  Type of Visit Initial  Care provided to: Pt not available;Family  Referral source Trauma page  Reason for visit Trauma  OnCall Visit No

## 2023-06-11 NOTE — ED Notes (Signed)
 EKG given to Nicanor Alcon, MD. PA-C at bedside. Pt denies chest pain, endorses upper left and right quadrant abdominal pain. Pt respirations regular, unlabored. EDP at bedside. GCS 15. P

## 2023-06-11 NOTE — Progress Notes (Signed)
 Chaplain provided information about AD form to Pt. Pt requested time to fill out the form with his wife. Pt will notify this office when form is ready for notarization.  Oneida Alar Chaplain Resident   06/11/23 1652  Spiritual Encounters  Type of Visit Initial  Care provided to: Patient  Referral source Clinical staff  Reason for visit Advance directives  OnCall Visit No

## 2023-06-12 DIAGNOSIS — I213 ST elevation (STEMI) myocardial infarction of unspecified site: Secondary | ICD-10-CM | POA: Diagnosis not present

## 2023-06-12 LAB — BASIC METABOLIC PANEL WITH GFR
Anion gap: 10 (ref 5–15)
BUN: 19 mg/dL (ref 8–23)
CO2: 25 mmol/L (ref 22–32)
Calcium: 9 mg/dL (ref 8.9–10.3)
Chloride: 102 mmol/L (ref 98–111)
Creatinine, Ser: 1.24 mg/dL (ref 0.61–1.24)
GFR, Estimated: >60 mL/min (ref >60)
Glucose, Bld: 94 mg/dL (ref 70–99)
Potassium: 3.6 mmol/L (ref 3.5–5.1)
Sodium: 137 mmol/L (ref 135–145)

## 2023-06-12 LAB — CBC WITH DIFFERENTIAL/PLATELET
Abs Immature Granulocytes: 0.02 10*3/uL (ref 0.00–0.07)
Basophils Absolute: 0 10*3/uL (ref 0.0–0.1)
Basophils Relative: 1 %
Eosinophils Absolute: 0.2 10*3/uL (ref 0.0–0.5)
Eosinophils Relative: 4 %
HCT: 37.4 % — ABNORMAL LOW (ref 39.0–52.0)
Hemoglobin: 12.1 g/dL — ABNORMAL LOW (ref 13.0–17.0)
Immature Granulocytes: 1 %
Lymphocytes Relative: 30 %
Lymphs Abs: 1.3 10*3/uL (ref 0.7–4.0)
MCH: 27.9 pg (ref 26.0–34.0)
MCHC: 32.4 g/dL (ref 30.0–36.0)
MCV: 86.2 fL (ref 80.0–100.0)
Monocytes Absolute: 0.5 10*3/uL (ref 0.1–1.0)
Monocytes Relative: 11 %
Neutro Abs: 2.4 10*3/uL (ref 1.7–7.7)
Neutrophils Relative %: 53 %
Platelets: 212 10*3/uL (ref 150–400)
RBC: 4.34 MIL/uL (ref 4.22–5.81)
RDW: 13.8 % (ref 11.5–15.5)
WBC: 4.3 10*3/uL (ref 4.0–10.5)
nRBC: 0 % (ref 0.0–0.2)

## 2023-06-12 LAB — LIPOPROTEIN A (LPA): Lipoprotein (a): 30.7 nmol/L — ABNORMAL HIGH (ref <75.0)

## 2023-06-12 MED ORDER — ROSUVASTATIN CALCIUM 20 MG PO TABS
40.0000 mg | ORAL_TABLET | Freq: Every day | ORAL | Status: DC
  Administered 2023-06-13: 40 mg via ORAL
  Filled 2023-06-12 (×2): qty 2

## 2023-06-12 MED ORDER — HYDRALAZINE HCL 10 MG PO TABS: 10.0000 mg | ORAL_TABLET | Freq: Once | ORAL | Status: AC | PRN

## 2023-06-12 NOTE — Progress Notes (Signed)
 CARDIAC REHAB PHASE I   PRE:  Rate/Rhythm: Sinus 64  BP:  Supine: 120/90     SaO2: 96% RA  MODE:  Ambulation: 450 ft   POST:  Rate/Rhythem: Sinus 61  BP:   Sitting: 143/101 recheck 153/90     SaO2: 95% RA  [id number] Patient ambulated independently in the hallway without complaints or chest pain using rollator. Patient assisted to recliner with call bell within reach. Patient's RN aware of BP. Reviewed MI booklet, heart healthy diet and temperature precautions.Exercise instructions. End point of exercise. Patient says he is on interested in participating in outpatient cardiac rehab. Referral placed in case the patient changes his mind.Discussed use of sublingual nitroglycerin and when to call 911.  Monte Antonio RN

## 2023-06-12 NOTE — Progress Notes (Signed)
 Rounding Note    Patient Name: Jeremy Hill Date of Encounter: 06/12/2023  [facility] Cardiologist: Arun K Thukkani, MD   Subjective   - Reports that he feels much better; however, very anxious about going home. Family at bedside.   Inpatient Medications    Scheduled Meds:  amLODipine  10 mg Oral Daily   aspirin EC  81 mg Oral Daily   clopidogrel  75 mg Oral Daily   hydrochlorothiazide  25 mg Oral Daily   lisinopril  40 mg Oral Daily   metoprolol tartrate  25 mg Oral BID   rosuvastatin  20 mg Oral Daily   senna  1 tablet Oral Daily   sodium chloride flush  3 mL Intravenous Q12H   spironolactone  25 mg Oral Daily   Continuous Infusions:   PRN Meds: acetaminophen, ondansetron (ZOFRAN) IV, mouth rinse, sodium chloride flush   Vital Signs    Vitals:   06/12/23 1200 06/12/23 1300 06/12/23 1400 06/12/23 1500  BP: 127/85 125/75 120/72 118/73  Pulse: 75 66 (!) 55 (!) 55  Resp: 14 (!) 6 16 16   Temp: 98.4 F (36.9 C)     TempSrc: Oral     SpO2: 94% 97% 94% 95%  Weight:      Height:        Intake/Output Summary (Last 24 hours) at 06/12/2023 1609 Last data filed at 06/12/2023 1500 Gross per 24 hour  Intake 483 ml  Output 1350 ml  Net -867 ml      06/11/2023    3:33 PM 06/11/2023    3:30 AM 01/13/2023    8:53 AM  Last 3 Weights  Weight (lbs) 220 lb 7.4 oz 220 lb 7.4 oz 217 lb  Weight (kg) 100 kg 100 kg 98.431 kg      Telemetry    Sinus rhythm in the 70s with isolated PACs  ECG    ECG (independently read by me): Normal sinus rhythm at 68 bpm.  ST elevation in leads I and aVL, T wave inversion V3 through V6 consistent with lateral MI.  Physical Exam   Vitals:   06/12/23 1400 06/12/23 1500  BP: 120/72 118/73  Pulse: (!) 55 (!) 55  Resp: 16 16  Temp:    SpO2: 94% 95%   GENERAL: NAD Lungs- CTA CARDIAC:  JVP: 6 cm          Normal rate with regular rhythm. no murmur.  Pulses 2+. no edema.  ABDOMEN: Soft, non-tender, non-distended.   EXTREMITIES: Warm and well perfused.  NEUROLOGIC: No obvious FND  High Sensitivity Troponin:   Recent Labs  Lab 06/10/23 2210 06/11/23 0220 06/11/23 0348  TROPONINIHS 69* 66* 61*     Chemistry Recent Labs  Lab 06/10/23 2220 06/11/23 0348 06/12/23 0317  NA 136 134* 137  K 3.8 3.5 3.6  CL 99 100 102  CO2 27 25 25   GLUCOSE 111* 106* 94  BUN 17 16 19   CREATININE 1.18 1.13 1.24  CALCIUM 9.6 8.9 9.0  PROT 7.7  --   --   ALBUMIN 3.9  --   --   AST 21  --   --   ALT 17  --   --   ALKPHOS 53  --   --   BILITOT 1.3*  --   --   GFRNONAA >60 >60 >60  ANIONGAP 10 9 10     Lipids  Recent Labs  Lab 06/11/23 0220  CHOL 167  TRIG 31  HDL 56  LDLCALC 105*  CHOLHDL 3.0    Hematology Recent Labs  Lab 06/10/23 2220 06/11/23 0348 06/12/23 0317  WBC 9.6 7.3 4.3  RBC 4.30 4.15* 4.34  HGB 12.1* 11.7* 12.1*  HCT 38.0* 36.0* 37.4*  MCV 88.4 86.7 86.2  MCH 28.1 28.2 27.9  MCHC 31.8 32.5 32.4  RDW 13.6 13.6 13.8  PLT 238 220 212   Thyroid No results for input(s): "TSH", "FREET4" in the last 168 hours.  BNPNo results for input(s): "BNP", "PROBNP" in the last 168 hours.  DDimer No results for input(s): "DDIMER" in the last 168 hours.   Radiology    ECHOCARDIOGRAM COMPLETE Result Date: 06/11/2023    ECHOCARDIOGRAM REPORT   Patient Name:   Jeremy Hill Date of Exam: 06/11/2023 Medical Rec #:  [id number]     Height:       73.0 in Accession #:    [id number]    Weight:       220.5 lb Date of Birth:  1945-05-29    BSA:          2.243 m Patient Age:    77 years      BP:           134/96 mmHg Patient Gender: M             HR:           55 bpm. Exam Location:  Inpatient Procedure: 2D Echo, Cardiac Doppler and Color Doppler (Both Spectral and Color            Flow Doppler were utilized during procedure). Indications:    R07.9* Chest pain, unspecified  History:        Patient has no prior history of Echocardiogram examinations.                 Acute MI; Risk Factors:Hypertension.   Sonographer:    Andrena Bang Referring Phys: [id number] ANTHONY L LIN IMPRESSIONS  1. Left ventricular ejection fraction, by estimation, is 55 to 60%. The left ventricle has normal function. The left ventricle has no regional wall motion abnormalities. There is mild concentric left ventricular hypertrophy. Left ventricular diastolic parameters are consistent with Grade I diastolic dysfunction (impaired relaxation).  2. Right ventricular systolic function is normal. The right ventricular size is normal.  3. Left atrial size was mild to moderately dilated.  4. The mitral valve is normal in structure. No evidence of mitral valve regurgitation. No evidence of mitral stenosis.  5. The aortic valve is tricuspid. There is mild calcification of the aortic valve. Aortic valve regurgitation is not visualized. Aortic valve sclerosis/calcification is present, without any evidence of aortic stenosis.  6. The inferior vena cava is normal in size with greater than 50% respiratory variability, suggesting right atrial pressure of 3 mmHg. FINDINGS  Left Ventricle: Left ventricular ejection fraction, by estimation, is 55 to 60%. The left ventricle has normal function. The left ventricle has no regional wall motion abnormalities. Definity contrast agent was given IV to delineate the left ventricular  endocardial borders. The left ventricular internal cavity size was normal in size. There is mild concentric left ventricular hypertrophy. Left ventricular diastolic parameters are consistent with Grade I diastolic dysfunction (impaired relaxation). Right Ventricle: The right ventricular size is normal. No increase in right ventricular wall thickness. Right ventricular systolic function is normal. Left Atrium: Left atrial size was mild to moderately dilated. Right Atrium: Right atrial size was normal in size. Pericardium: There is no evidence  of pericardial effusion. Mitral Valve: The mitral valve is normal in structure. No evidence of mitral  valve regurgitation. No evidence of mitral valve stenosis. Tricuspid Valve: The tricuspid valve is normal in structure. Tricuspid valve regurgitation is not demonstrated. No evidence of tricuspid stenosis. Aortic Valve: The aortic valve is tricuspid. There is mild calcification of the aortic valve. Aortic valve regurgitation is not visualized. Aortic valve sclerosis/calcification is present, without any evidence of aortic stenosis. Aortic valve mean gradient measures 4.0 mmHg. Aortic valve peak gradient measures 7.5 mmHg. Aortic valve area, by VTI measures 3.49 cm. Pulmonic Valve: The pulmonic valve was not well visualized. Pulmonic valve regurgitation is trivial. No evidence of pulmonic stenosis. Aorta: The aortic root is normal in size and structure. Venous: The inferior vena cava is normal in size with greater than 50% respiratory variability, suggesting right atrial pressure of 3 mmHg. IAS/Shunts: No atrial level shunt detected by color flow Doppler.  LEFT VENTRICLE PLAX 2D LVIDd:         5.20 cm      Diastology LVIDs:         3.70 cm      LV e' medial:    4.35 cm/s LV PW:         1.20 cm      LV E/e' medial:  10.9 LV IVS:        1.40 cm      LV e' lateral:   8.70 cm/s LVOT diam:     2.50 cm      LV E/e' lateral: 5.5 LV SV:         101 LV SV Index:   45 LVOT Area:     4.91 cm  LV Volumes (MOD) LV vol d, MOD A2C: 141.0 ml LV vol d, MOD A4C: 162.0 ml LV vol s, MOD A2C: 40.5 ml LV vol s, MOD A4C: 53.8 ml LV SV MOD A2C:     100.5 ml LV SV MOD A4C:     162.0 ml LV SV MOD BP:      104.1 ml RIGHT VENTRICLE RV S prime:     8.70 cm/s TAPSE (M-mode): 1.8 cm LEFT ATRIUM             Index LA diam:        3.90 cm 1.74 cm/m LA Vol (A2C):   97.8 ml 43.61 ml/m LA Vol (A4C):   85.3 ml 38.04 ml/m LA Biplane Vol: 90.9 ml 40.53 ml/m  AORTIC VALVE AV Area (Vmax):    3.40 cm AV Area (Vmean):   2.88 cm AV Area (VTI):     3.49 cm AV Vmax:           137.00 cm/s AV Vmean:          96.800 cm/s AV VTI:            0.288 m AV Peak  Grad:      7.5 mmHg AV Mean Grad:      4.0 mmHg LVOT Vmax:         94.80 cm/s LVOT Vmean:        56.700 cm/s LVOT VTI:          0.205 m LVOT/AV VTI ratio: 0.71  AORTA Ao Asc diam: 3.30 cm MITRAL VALVE MV Area (PHT): 2.99 cm    SHUNTS MV Decel Time: 254 msec    Systemic VTI:  0.20 m MV E velocity: 47.60 cm/s  Systemic Diam: 2.50 cm MV A velocity: 56.60 cm/s  MV E/A ratio:  0.84 Jules Oar MD Electronically signed by Jules Oar MD Signature Date/Time: 06/11/2023/3:25:21 PM    Final    CARDIAC CATHETERIZATION Result Date: 06/11/2023   Lat 2nd Mrg lesion is 100% stenosed. 1.  Torturous right upper extremity and radial loop requiring destination sheath for coronary visualization. 2.  Tortuous but patent coronary arteries; there may be a distal vessel cutoff of a very small subbranch of the obtuse marginal.  Trend troponins and if suggestive consider dual antiplatelet therapy with Plavix for 6 months. 3.  LVEDP of 28 mmHg with ejection fraction around 50%. Recommendation: Medical therapy with focus on aggressive blood pressure control.   CT Angio Chest/Abd/Pel for Dissection W and/or Wo Contrast Result Date: 06/11/2023 CLINICAL DATA:  Chest pain epigastric pain EXAM: CT ANGIOGRAPHY CHEST, ABDOMEN AND PELVIS TECHNIQUE: Non-contrast CT of the chest was initially obtained. Multidetector CT imaging through the chest, abdomen and pelvis was performed using the standard protocol during bolus administration of intravenous contrast. Multiplanar reconstructed images and MIPs were obtained and reviewed to evaluate the vascular anatomy. RADIATION DOSE REDUCTION: This exam was performed according to the departmental dose-optimization program which includes automated exposure control, adjustment of the mA and/or kV according to patient size and/or use of iterative reconstruction technique. CONTRAST:  OMNIPAQUE IOHEXOL 350 MG/ML SOLN COMPARISON:  Chest x-ray 06/11/2023 FINDINGS: CTA CHEST FINDINGS Cardiovascular:  Non contrasted images of the chest demonstrate no acute intramural hematoma. No dissection is seen. Aorta is nonaneurysmal. Common origin of the right brachiocephalic and left common carotid arteries. Mild stenosis at the origin of the left subclavian artery. Cardiomegaly. No pericardial effusion Mediastinum/Nodes: Patent trachea. No thyroid mass. No suspicious lymph nodes. Esophagus within normal limits Lungs/Pleura: No acute airspace disease, pleural effusion or pneumothorax. Atelectasis at the bases Musculoskeletal: Sternum appears intact. Multilevel degenerative change. No acute osseous abnormality Review of the MIP images confirms the above findings. CTA ABDOMEN AND PELVIS FINDINGS VASCULAR Aorta: Normal caliber aorta without aneurysm, dissection, vasculitis or significant stenosis. Celiac: No dissection or occlusion. Mild aneurysmal dilatation of the proximal celiac artery about a cm distal to the origin measuring up to 10 mm. SMA: Patent without evidence of aneurysm or dissection. Suspicion of wall thickening of the SMA but without focal stenosis. Mild diffuse enlargement of the distal SMA, coronal 11 image 95. Renals: Both renal arteries are patent without evidence of aneurysm, dissection, vasculitis, fibromuscular dysplasia or significant stenosis. IMA: Patent without evidence of aneurysm, dissection, vasculitis or significant stenosis. Inflow: Patent without evidence of aneurysm, dissection, vasculitis or significant stenosis. Veins: Suboptimally assessed Review of the MIP images confirms the above findings. NON-VASCULAR Hepatobiliary: No focal liver abnormality is seen. No gallstones, gallbladder wall thickening, or biliary dilatation. Pancreas: Unremarkable. No pancreatic ductal dilatation or surrounding inflammatory changes. Spleen: Normal in size without focal abnormality. Adrenals/Urinary Tract: Adrenal glands are unremarkable. Kidneys are normal, without renal calculi, focal lesion, or  hydronephrosis. Bladder is slightly thick walled with mild perivesical stranding. Stomach/Bowel: Stomach is within normal limits. Appendix appears normal. No evidence of bowel wall thickening, distention, or inflammatory changes. Lymphatic: No suspicious lymph nodes Reproductive: Multiple prostate seeds Other: Negative for pelvic effusion or free air. Moderate fat containing left inguinal hernia. Musculoskeletal: Degenerative changes. No acute osseous abnormality. Review of the MIP images confirms the above findings. IMPRESSION: 1. Negative for acute aortic dissection or aneurysm. 2. 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 3. Cardiomegaly. 4. Slight bladder wall thickening with mild  perivesical stranding, correlate for cystitis. 5. Moderate fat containing left inguinal hernia. Electronically Signed   By: Esmeralda Hedge M.D.   On: 06/11/2023 02:10   DG Chest Portable 1 View Result Date: 06/11/2023 CLINICAL DATA:  Painetvc  Encounter for pain, epigastric pain EXAM: PORTABLE CHEST 1 VIEW COMPARISON:  None Available.  Chest x-ray 08/11/2018 FINDINGS: The heart and mediastinal contours are unchanged. Left base atelectasis. No focal consolidation. No pulmonary edema. No pleural effusion. No pneumothorax. No acute osseous abnormality. IMPRESSION: No active disease. Electronically Signed   By: Morgane  Naveau M.D.   On: 06/11/2023 01:36    Cardiac Studies   CATH: Lat 2nd Mrg lesion is 100% stenosed.   1.  Torturous right upper extremity and radial loop requiring destination sheath for coronary visualization. 2.  Tortuous but patent coronary arteries; there may be a distal vessel cutoff of a very small subbranch of the obtuse marginal.  Trend troponins and if suggestive consider dual antiplatelet therapy with Plavix for 6 months. 3.  LVEDP of 28 mmHg with ejection fraction around 50%.   Recommendation: Medical therapy with focus on aggressive blood pressure  control.    Patient Profile     Jeremy Hill is a 78 y.o. 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  No significant concomitant CAD.  Continue ASA 81mg /plavis 75mg  daily.  Current heart rate 72 with PACs, troponins minimally elevated at 69 > 66 > 61.  Continue  metoprolol succinate 25 mg daily.  TTE with LVEF 55%. Medical therapy for MI; intervention not performed. Hypertension: BP at goal. Hyperlipidemia.  Crestor 40mg  daily.  History of prostate cancer followed at [facility] urology. 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 Discussed discharge with patient & family however they are very anxious. Wish to remain inpatient for tonight. Will plan for discharge tomorrow AM.    For questions or updates, please contact [facility] Please consult [email or web address] for contact info under        Signed, Alwin Baars, DO  06/12/2023, 4:09 PM

## 2023-06-12 NOTE — Plan of Care (Signed)

## 2023-06-13 DIAGNOSIS — R109 Unspecified abdominal pain: Secondary | ICD-10-CM | POA: Insufficient documentation

## 2023-06-13 DIAGNOSIS — I2129 ST elevation (STEMI) myocardial infarction involving other sites: Secondary | ICD-10-CM | POA: Diagnosis not present

## 2023-06-13 MED ORDER — ROSUVASTATIN CALCIUM 40 MG PO TABS: 40.0000 mg | ORAL_TABLET | Freq: Every day | ORAL | 3 refills | Status: AC

## 2023-06-13 MED ORDER — ASPIRIN 81 MG PO TBEC: 81.0000 mg | DELAYED_RELEASE_TABLET | Freq: Every day | ORAL | Status: DC

## 2023-06-13 MED ORDER — METOPROLOL TARTRATE 25 MG PO TABS: 25.0000 mg | ORAL_TABLET | Freq: Two times a day (BID) | ORAL | 3 refills | Status: DC

## 2023-06-13 MED ORDER — CLOPIDOGREL BISULFATE 75 MG PO TABS: 75.0000 mg | ORAL_TABLET | Freq: Every day | ORAL | 1 refills | Status: DC

## 2023-06-13 NOTE — Discharge Summary (Signed)
 Discharge Summary    Patient ID: Jeremy Hill MRN: [id number]; DOB: 1945/11/24  Admit date: 06/10/2023 Discharge date: 06/13/2023  PCP:  Driscilla George, MD   [facility] Providers Cardiologist:  Kyra Phy, MD        Discharge Diagnoses    Principal Problem:   STEMI (ST elevation myocardial infarction) [facility]) Active Problems:   Essential hypertension   HLD (hyperlipidemia)   Abdominal pain    Diagnostic Studies/Procedures    CT abdomen and pelvis 06/11/2023 IMPRESSION: 1. Negative for acute aortic dissection or aneurysm. 2. 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 3. Cardiomegaly. 4. Slight bladder wall thickening with mild perivesical stranding, correlate for cystitis. 5. Moderate fat containing left inguinal hernia.       Cath 06/11/2023   Lat 2nd Mrg lesion is 100% stenosed.   1.  Torturous right upper extremity and radial loop requiring destination sheath for coronary visualization. 2.  Tortuous but patent coronary arteries; there may be a distal vessel cutoff of a very small subbranch of the obtuse marginal.  Trend troponins and if suggestive consider dual antiplatelet therapy with Plavix for 6 months. 3.  LVEDP of 28 mmHg with ejection fraction around 50%.   Recommendation: Medical therapy with focus on aggressive blood pressure control.       Echo 06/11/2023 1. Left ventricular ejection fraction, by estimation, is 55 to 60%. The  left ventricle has normal function. The left ventricle has no regional  wall motion abnormalities. There is mild concentric left ventricular  hypertrophy. Left ventricular diastolic  parameters are consistent with Grade I diastolic dysfunction (impaired  relaxation).   2. Right ventricular systolic function is normal. The right ventricular  size is normal.   3. Left atrial size was mild to moderately dilated.   4. The mitral valve is normal in structure. No evidence of mitral  valve  regurgitation. No evidence of mitral stenosis.   5. The aortic valve is tricuspid. There is mild calcification of the  aortic valve. Aortic valve regurgitation is not visualized. Aortic valve  sclerosis/calcification is present, without any evidence of aortic  stenosis.   6. The inferior vena cava is normal in size with greater than 50%  respiratory variability, suggesting right atrial pressure of 3 mmHg.  _____________   History of Present Illness     Jeremy Hill is a 78 y.o. 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   No prior history of CAD or prior heart attack. On 06/10/23, he started having severe 10/10 upper abdominal pain extending from the RUQ to the LUQ. No associated N/V/D. He went to urgent care for evaluation and they sent him to the ED. In the ED, an EKG was performed at 10:22pm, showing ST elevation in aVR and TWI in the lateral leads. Repeat EKG at 06/11/23 01:15am showing ST elevations in lead I and aVL and TWI with ST depression in lateral and inferior leads for which he was sent for a CTA abdomen to rule out aortic dissection and cardiology was consulted. After confirming no dissection on CTA, he was given ASA 324mg  PO x1, atorvastatin 80mg  x1, and heparin 4000u IV, and a code STEMI was paged out for emergent catheterization.   Denies prior tobacco use, prior MI or stroke, or history of diabetes. Add-on troponin to his 10pm labs resulting 69  prior to transfer to the cath lab.  Hospital Course     Consultants: N/A   Patient was admitted to cardiology service.  CT of the abdomen and pelvis showed no acute finding.  Serial troponin borderline elevated at 69-->66-->61.  He was taken to the Cath Lab in early morning on/01/2024 which showed 100% lateral second OM subbranch occlusion, tortuous right upper extremity and radial loop, tortuous but patent with  mainly coronary arteries.  Medical therapy was recommended.  Postprocedure, patient was placed on aspirin and Plavix.  Echocardiogram obtained on 06/11/2023 showed EF 55 to 60%, no regional wall motion abnormality, grade 1 DD, moderate LAE, normal RV, no significant valve issue.  Rosuvastatin increased to 40 mg daily.  He was seen in the morning on 06/13/2023 at which time he was doing well.  He no longer has any further abdominal discomfort.  He has no chest pain or shortness of breath.  He is deemed stable for discharge from the cardiac perspective.  During this hospitalization, low-dose metoprolol was added to his blood pressure medications.  Aspirin and Plavix was also added.  Crestor was increased to 40 mg daily.     Did the patient have an acute coronary syndrome (MI, NSTEMI, STEMI, etc) this admission?:  Yes                               AHA/ACC ACS Clinical Performance & Quality Measures: Aspirin prescribed? - Yes ADP Receptor Inhibitor (Plavix/Clopidogrel, Brilinta/Ticagrelor or Effient/Prasugrel) prescribed (includes medically managed patients)? - Yes Beta Blocker prescribed? - Yes High Intensity Statin (Lipitor 40-80mg  or Crestor 20-40mg ) prescribed? - Yes EF assessed during THIS hospitalization? - Yes For EF <40%, was ACEI/ARB prescribed? - Not Applicable (EF >/= 40%) For EF <40%, Aldosterone Antagonist (Spironolactone or Eplerenone) prescribed? - Not Applicable (EF >/= 40%) Cardiac Rehab Phase II ordered (including medically managed patients)? - Yes         _____________  Discharge Vitals Blood pressure (!) 105/92, pulse 66, temperature 98.2 F (36.8 C), temperature source Oral, resp. rate 13, height 6\' 1"  (1.854 m), weight 100 kg, SpO2 98%.  Filed Weights   06/11/23 0330 06/11/23 1533  Weight: 100 kg 100 kg   Telemetry    NSR with occasional PVCs, HR 50s - Personally Reviewed   ECG    Sinus rhythm with TWI in the lateral leads - Personally Reviewed   Physical Exam     GEN: No acute distress.   Neck: No JVD Cardiac: RRR, no murmurs, rubs, or gallops.  Respiratory: Clear to auscultation bilaterally. GI: Soft, nontender, non-distended  MS: No edema; No deformity. Neuro:  Nonfocal  Psych: Normal affect   Labs & Radiologic Studies    CBC Recent Labs    06/11/23 0348 06/12/23 0317  WBC 7.3 4.3  NEUTROABS 4.9 2.4  HGB 11.7* 12.1*  HCT 36.0* 37.4*  MCV 86.7 86.2  PLT 220 212   Basic Metabolic Panel Recent Labs    16/10/96 0348 06/12/23 0317  NA 134* 137  K 3.5 3.6  CL 100 102  CO2 25 25  GLUCOSE 106* 94  BUN 16 19  CREATININE 1.13 1.24  CALCIUM 8.9 9.0   Liver Function Tests Recent Labs    06/10/23 2220  AST 21  ALT 17  ALKPHOS 53  BILITOT 1.3*  PROT 7.7  ALBUMIN 3.9   Recent Labs    06/10/23 2220  LIPASE 24  High Sensitivity Troponin:   Recent Labs  Lab 06/10/23 2210 06/11/23 0220 06/11/23 0348  TROPONINIHS 69* 66* 61*    BNP Invalid input(s): "POCBNP" D-Dimer No results for input(s): "DDIMER" in the last 72 hours. Hemoglobin A1C Recent Labs    06/10/23 2220  HGBA1C 5.2   Fasting Lipid Panel Recent Labs    06/11/23 0220  CHOL 167  HDL 56  LDLCALC 105*  TRIG 31  CHOLHDL 3.0   Thyroid Function Tests No results for input(s): "TSH", "T4TOTAL", "T3FREE", "THYROIDAB" in the last 72 hours.  Invalid input(s): "FREET3" _____________  ECHOCARDIOGRAM COMPLETE Result Date: 06/11/2023    ECHOCARDIOGRAM REPORT   Patient Name:   Jeremy Hill Date of Exam: 06/11/2023 Medical Rec #:  [id number]     Height:       73.0 in Accession #:    [id number]    Weight:       220.5 lb Date of Birth:  02-19-1946    BSA:          2.243 m Patient Age:    77 years      BP:           134/96 mmHg Patient Gender: M             HR:           55 bpm. Exam Location:  Inpatient Procedure: 2D Echo, Cardiac Doppler and Color Doppler (Both Spectral and Color            Flow Doppler were utilized during procedure). Indications:    R07.9*  Chest pain, unspecified  History:        Patient has no prior history of Echocardiogram examinations.                 Acute MI; Risk Factors:Hypertension.  Sonographer:    Andrena Bang Referring Phys: [id number] ANTHONY L LIN IMPRESSIONS  1. Left ventricular ejection fraction, by estimation, is 55 to 60%. The left ventricle has normal function. The left ventricle has no regional wall motion abnormalities. There is mild concentric left ventricular hypertrophy. Left ventricular diastolic parameters are consistent with Grade I diastolic dysfunction (impaired relaxation).  2. Right ventricular systolic function is normal. The right ventricular size is normal.  3. Left atrial size was mild to moderately dilated.  4. The mitral valve is normal in structure. No evidence of mitral valve regurgitation. No evidence of mitral stenosis.  5. The aortic valve is tricuspid. There is mild calcification of the aortic valve. Aortic valve regurgitation is not visualized. Aortic valve sclerosis/calcification is present, without any evidence of aortic stenosis.  6. The inferior vena cava is normal in size with greater than 50% respiratory variability, suggesting right atrial pressure of 3 mmHg. FINDINGS  Left Ventricle: Left ventricular ejection fraction, by estimation, is 55 to 60%. The left ventricle has normal function. The left ventricle has no regional wall motion abnormalities. Definity contrast agent was given IV to delineate the left ventricular  endocardial borders. The left ventricular internal cavity size was normal in size. There is mild concentric left ventricular hypertrophy. Left ventricular diastolic parameters are consistent with Grade I diastolic dysfunction (impaired relaxation). Right Ventricle: The right ventricular size is normal. No increase in right ventricular wall thickness. Right ventricular systolic function is normal. Left Atrium: Left atrial size was mild to moderately dilated. Right Atrium: Right atrial size  was normal in size. Pericardium: There is no evidence of pericardial effusion. Mitral Valve: The mitral valve is  normal in structure. No evidence of mitral valve regurgitation. No evidence of mitral valve stenosis. Tricuspid Valve: The tricuspid valve is normal in structure. Tricuspid valve regurgitation is not demonstrated. No evidence of tricuspid stenosis. Aortic Valve: The aortic valve is tricuspid. There is mild calcification of the aortic valve. Aortic valve regurgitation is not visualized. Aortic valve sclerosis/calcification is present, without any evidence of aortic stenosis. Aortic valve mean gradient measures 4.0 mmHg. Aortic valve peak gradient measures 7.5 mmHg. Aortic valve area, by VTI measures 3.49 cm. Pulmonic Valve: The pulmonic valve was not well visualized. Pulmonic valve regurgitation is trivial. No evidence of pulmonic stenosis. Aorta: The aortic root is normal in size and structure. Venous: The inferior vena cava is normal in size with greater than 50% respiratory variability, suggesting right atrial pressure of 3 mmHg. IAS/Shunts: No atrial level shunt detected by color flow Doppler.  LEFT VENTRICLE PLAX 2D LVIDd:         5.20 cm      Diastology LVIDs:         3.70 cm      LV e' medial:    4.35 cm/s LV PW:         1.20 cm      LV E/e' medial:  10.9 LV IVS:        1.40 cm      LV e' lateral:   8.70 cm/s LVOT diam:     2.50 cm      LV E/e' lateral: 5.5 LV SV:         101 LV SV Index:   45 LVOT Area:     4.91 cm  LV Volumes (MOD) LV vol d, MOD A2C: 141.0 ml LV vol d, MOD A4C: 162.0 ml LV vol s, MOD A2C: 40.5 ml LV vol s, MOD A4C: 53.8 ml LV SV MOD A2C:     100.5 ml LV SV MOD A4C:     162.0 ml LV SV MOD BP:      104.1 ml RIGHT VENTRICLE RV S prime:     8.70 cm/s TAPSE (M-mode): 1.8 cm LEFT ATRIUM             Index LA diam:        3.90 cm 1.74 cm/m LA Vol (A2C):   97.8 ml 43.61 ml/m LA Vol (A4C):   85.3 ml 38.04 ml/m LA Biplane Vol: 90.9 ml 40.53 ml/m  AORTIC VALVE AV Area (Vmax):    3.40  cm AV Area (Vmean):   2.88 cm AV Area (VTI):     3.49 cm AV Vmax:           137.00 cm/s AV Vmean:          96.800 cm/s AV VTI:            0.288 m AV Peak Grad:      7.5 mmHg AV Mean Grad:      4.0 mmHg LVOT Vmax:         94.80 cm/s LVOT Vmean:        56.700 cm/s LVOT VTI:          0.205 m LVOT/AV VTI ratio: 0.71  AORTA Ao Asc diam: 3.30 cm MITRAL VALVE MV Area (PHT): 2.99 cm    SHUNTS MV Decel Time: 254 msec    Systemic VTI:  0.20 m MV E velocity: 47.60 cm/s  Systemic Diam: 2.50 cm MV A velocity: 56.60 cm/s MV E/A ratio:  0.84 Jules Oar MD Electronically signed  by Jules Oar MD Signature Date/Time: 06/11/2023/3:25:21 PM    Final    CARDIAC CATHETERIZATION Result Date: 06/11/2023   Lat 2nd Mrg lesion is 100% stenosed. 1.  Torturous right upper extremity and radial loop requiring destination sheath for coronary visualization. 2.  Tortuous but patent coronary arteries; there may be a distal vessel cutoff of a very small subbranch of the obtuse marginal.  Trend troponins and if suggestive consider dual antiplatelet therapy with Plavix for 6 months. 3.  LVEDP of 28 mmHg with ejection fraction around 50%. Recommendation: Medical therapy with focus on aggressive blood pressure control.   CT Angio Chest/Abd/Pel for Dissection W and/or Wo Contrast Result Date: 06/11/2023 CLINICAL DATA:  Chest pain epigastric pain EXAM: CT ANGIOGRAPHY CHEST, ABDOMEN AND PELVIS TECHNIQUE: Non-contrast CT of the chest was initially obtained. Multidetector CT imaging through the chest, abdomen and pelvis was performed using the standard protocol during bolus administration of intravenous contrast. Multiplanar reconstructed images and MIPs were obtained and reviewed to evaluate the vascular anatomy. RADIATION DOSE REDUCTION: This exam was performed according to the departmental dose-optimization program which includes automated exposure control, adjustment of the mA and/or kV according to patient size and/or use of iterative  reconstruction technique. CONTRAST:  OMNIPAQUE IOHEXOL 350 MG/ML SOLN COMPARISON:  Chest x-ray 06/11/2023 FINDINGS: CTA CHEST FINDINGS Cardiovascular: Non contrasted images of the chest demonstrate no acute intramural hematoma. No dissection is seen. Aorta is nonaneurysmal. Common origin of the right brachiocephalic and left common carotid arteries. Mild stenosis at the origin of the left subclavian artery. Cardiomegaly. No pericardial effusion Mediastinum/Nodes: Patent trachea. No thyroid mass. No suspicious lymph nodes. Esophagus within normal limits Lungs/Pleura: No acute airspace disease, pleural effusion or pneumothorax. Atelectasis at the bases Musculoskeletal: Sternum appears intact. Multilevel degenerative change. No acute osseous abnormality Review of the MIP images confirms the above findings. CTA ABDOMEN AND PELVIS FINDINGS VASCULAR Aorta: Normal caliber aorta without aneurysm, dissection, vasculitis or significant stenosis. Celiac: No dissection or occlusion. Mild aneurysmal dilatation of the proximal celiac artery about a cm distal to the origin measuring up to 10 mm. SMA: Patent without evidence of aneurysm or dissection. Suspicion of wall thickening of the SMA but without focal stenosis. Mild diffuse enlargement of the distal SMA, coronal 11 image 95. Renals: Both renal arteries are patent without evidence of aneurysm, dissection, vasculitis, fibromuscular dysplasia or significant stenosis. IMA: Patent without evidence of aneurysm, dissection, vasculitis or significant stenosis. Inflow: Patent without evidence of aneurysm, dissection, vasculitis or significant stenosis. Veins: Suboptimally assessed Review of the MIP images confirms the above findings. NON-VASCULAR Hepatobiliary: No focal liver abnormality is seen. No gallstones, gallbladder wall thickening, or biliary dilatation. Pancreas: Unremarkable. No pancreatic ductal dilatation or surrounding inflammatory changes. Spleen: Normal in size  without focal abnormality. Adrenals/Urinary Tract: Adrenal glands are unremarkable. Kidneys are normal, without renal calculi, focal lesion, or hydronephrosis. Bladder is slightly thick walled with mild perivesical stranding. Stomach/Bowel: Stomach is within normal limits. Appendix appears normal. No evidence of bowel wall thickening, distention, or inflammatory changes. Lymphatic: No suspicious lymph nodes Reproductive: Multiple prostate seeds Other: Negative for pelvic effusion or free air. Moderate fat containing left inguinal hernia. Musculoskeletal: Degenerative changes. No acute osseous abnormality. Review of the MIP images confirms the above findings. IMPRESSION: 1. Negative for acute aortic dissection or aneurysm. 2. 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 3. Cardiomegaly. 4. Slight bladder wall thickening with mild perivesical stranding, correlate for cystitis. 5. Moderate fat containing left  inguinal hernia. Electronically Signed   By: Esmeralda Hedge M.D.   On: 06/11/2023 02:10   DG Chest Portable 1 View Result Date: 06/11/2023 CLINICAL DATA:  Painetvc  Encounter for pain, epigastric pain EXAM: PORTABLE CHEST 1 VIEW COMPARISON:  None Available.  Chest x-ray 08/11/2018 FINDINGS: The heart and mediastinal contours are unchanged. Left base atelectasis. No focal consolidation. No pulmonary edema. No pleural effusion. No pneumothorax. No acute osseous abnormality. IMPRESSION: No active disease. Electronically Signed   By: Morgane  Naveau M.D.   On: 06/11/2023 01:36   Disposition   Pt is being discharged home today in good condition.  Follow-up Plans & Appointments     Follow-up Information     Driscilla George, MD Follow up.   Specialty: Internal Medicine Why: please call to schedule hospital follow up with PCP in 7-14 days Contact information: [address] [place] [address] [phone]         Gerald Kitty., NP Follow up on  07/02/2023.   Specialty: Cardiology Why: 8:50AM. Cardiology follow up Contact information: [address] [place] [address] [phone]                Discharge Instructions     AMB referral to Phase II Cardiac Rehabilitation   Complete by: As directed    Diagnosis: Type II MI   After initial evaluation and assessments completed: Virtual Based Care may be provided alone or in conjunction with Phase 2 Cardiac Rehab based on patient barriers.: Yes   Intensive Cardiac Rehabilitation (ICR) [facility] location only OR Traditional Cardiac Rehabilitation (TCR) *If criteria for ICR are not met will enroll in TCR [facility] only): Yes   Amb Referral to Cardiac Rehabilitation   Complete by: As directed    Diagnosis: STEMI   After initial evaluation and assessments completed: Virtual Based Care may be provided alone or in conjunction with Phase 2 Cardiac Rehab based on patient barriers.: Yes   Intensive Cardiac Rehabilitation (ICR) [facility] location only OR Traditional Cardiac Rehabilitation (TCR) *If criteria for ICR are not met will enroll in TCR [facility] only): Yes   Diet - low sodium heart healthy   Complete by: As directed    Discharge instructions   Complete by: As directed    No lifting over 5 lbs for 1 week. No sexual activity for 1 week. Keep procedure site clean & dry. If you notice increased pain, swelling, bleeding or pus, call/return!  You may shower, but no soaking baths/hot tubs/pools for 1 week.   Increase activity slowly   Complete by: As directed         Discharge Medications   Allergies as of 06/13/2023       Reactions   Oxycodone    States makes him nervous and sick.        Medication List     TAKE these medications    amLODipine 10 MG tablet Commonly known as: NORVASC Take 1 tablet (10 mg total) by mouth daily.   aspirin EC 81 MG tablet Take 1 tablet (81 mg total) by mouth daily. Swallow whole.   clopidogrel 75 MG tablet Commonly known as:  PLAVIX Take 1 tablet (75 mg total) by mouth daily.   hydrochlorothiazide 25 MG tablet Commonly known as: HYDRODIURIL TAKE 1 TABLET(25 MG) BY MOUTH DAILY What changed: See the new instructions.   HYDROcodone-acetaminophen 10-325 MG tablet Commonly known as: NORCO Take 1 tablet by mouth every 8 (eight) hours as  needed for severe pain (pain score 7-10).   lisinopril 40 MG tablet Commonly known as: ZESTRIL Take 1 tablet (40 mg total) by mouth daily.   metoprolol tartrate 25 MG tablet Commonly known as: LOPRESSOR Take 1 tablet (25 mg total) by mouth 2 (two) times daily.   rosuvastatin 40 MG tablet Commonly known as: Crestor Take 1 tablet (40 mg total) by mouth daily. What changed:  medication strength how much to take   spironolactone 25 MG tablet Commonly known as: ALDACTONE TAKE 1 TABLET(25 MG) BY MOUTH DAILY           Outstanding Labs/Studies   N/A  Duration of Discharge Encounter: APP Time: 15 minutes   Signed, Ervin Heath, PA 06/13/2023, 8:41 AM

## 2023-06-14 ENCOUNTER — Telehealth: Payer: Self-pay

## 2023-06-14 ENCOUNTER — Other Ambulatory Visit: Payer: Self-pay

## 2023-06-14 ENCOUNTER — Emergency Department (HOSPITAL_COMMUNITY)

## 2023-06-14 ENCOUNTER — Emergency Department (HOSPITAL_COMMUNITY)
Admission: EM | Admit: 2023-06-14 | Discharge: 2023-06-14 | Disposition: A | Discharge status: Home / Self Care | Attending: Emergency Medicine | Admitting: Emergency Medicine

## 2023-06-14 ENCOUNTER — Encounter (HOSPITAL_COMMUNITY): Payer: Self-pay | Admitting: Internal Medicine

## 2023-06-14 DIAGNOSIS — Z7982 Long term (current) use of aspirin: Secondary | ICD-10-CM | POA: Insufficient documentation

## 2023-06-14 DIAGNOSIS — R109 Unspecified abdominal pain: Secondary | ICD-10-CM | POA: Diagnosis not present

## 2023-06-14 DIAGNOSIS — R6883 Chills (without fever): Secondary | ICD-10-CM | POA: Diagnosis not present

## 2023-06-14 DIAGNOSIS — K4020 Bilateral inguinal hernia, without obstruction or gangrene, not specified as recurrent: Secondary | ICD-10-CM | POA: Diagnosis not present

## 2023-06-14 DIAGNOSIS — R112 Nausea with vomiting, unspecified: Secondary | ICD-10-CM | POA: Diagnosis not present

## 2023-06-14 DIAGNOSIS — R932 Abnormal findings on diagnostic imaging of liver and biliary tract: Secondary | ICD-10-CM | POA: Diagnosis not present

## 2023-06-14 DIAGNOSIS — Z7902 Long term (current) use of antithrombotics/antiplatelets: Secondary | ICD-10-CM | POA: Insufficient documentation

## 2023-06-14 DIAGNOSIS — I10 Essential (primary) hypertension: Secondary | ICD-10-CM | POA: Diagnosis not present

## 2023-06-14 LAB — CBC
HCT: 43.4 % (ref 39.0–52.0)
Hemoglobin: 14 g/dL (ref 13.0–17.0)
MCH: 28.1 pg (ref 26.0–34.0)
MCHC: 32.3 g/dL (ref 30.0–36.0)
MCV: 87.1 fL (ref 80.0–100.0)
Platelets: 292 10*3/uL (ref 150–400)
RBC: 4.98 MIL/uL (ref 4.22–5.81)
RDW: 13.3 % (ref 11.5–15.5)
WBC: 8.7 10*3/uL (ref 4.0–10.5)
nRBC: 0 % (ref 0.0–0.2)

## 2023-06-14 LAB — COMPREHENSIVE METABOLIC PANEL WITH GFR
ALT: 17 U/L (ref 0–44)
AST: 26 U/L (ref 15–41)
Albumin: 4 g/dL (ref 3.5–5.0)
Alkaline Phosphatase: 55 U/L (ref 38–126)
Anion gap: 12 (ref 5–15)
BUN: 23 mg/dL (ref 8–23)
CO2: 24 mmol/L (ref 22–32)
Calcium: 9.9 mg/dL (ref 8.9–10.3)
Chloride: 99 mmol/L (ref 98–111)
Creatinine, Ser: 1.28 mg/dL — ABNORMAL HIGH (ref 0.61–1.24)
GFR, Estimated: 58 mL/min — ABNORMAL LOW (ref >60)
Glucose, Bld: 153 mg/dL — ABNORMAL HIGH (ref 70–99)
Potassium: 4.2 mmol/L (ref 3.5–5.1)
Sodium: 135 mmol/L (ref 135–145)
Total Bilirubin: 0.8 mg/dL (ref 0.0–1.2)
Total Protein: 8.3 g/dL — ABNORMAL HIGH (ref 6.5–8.1)

## 2023-06-14 LAB — TROPONIN I (HIGH SENSITIVITY)
Troponin I (High Sensitivity): 37 ng/L — ABNORMAL HIGH (ref <18)
Troponin I (High Sensitivity): 36 ng/L — ABNORMAL HIGH (ref <18)

## 2023-06-14 LAB — LIPASE, BLOOD: Lipase: 32 U/L (ref 11–51)

## 2023-06-14 MED ORDER — ONDANSETRON HCL 4 MG/2ML IJ SOLN
4.0000 mg | Freq: Once | INTRAMUSCULAR | Status: AC
  Administered 2023-06-14: 4 mg via INTRAVENOUS
  Filled 2023-06-14: qty 2

## 2023-06-14 MED ORDER — ONDANSETRON 4 MG PO TBDP: 4.0000 mg | ORAL_TABLET | Freq: Three times a day (TID) | ORAL | 0 refills | Status: AC | PRN

## 2023-06-14 MED ORDER — IOHEXOL 350 MG/ML SOLN
100.0000 mL | Freq: Once | INTRAVENOUS | Status: AC | PRN
  Administered 2023-06-14: 100 mL via INTRAVENOUS

## 2023-06-14 MED ORDER — SODIUM CHLORIDE 0.9 % IV BOLUS
500.0000 mL | Freq: Once | INTRAVENOUS | Status: AC
  Administered 2023-06-14: 500 mL via INTRAVENOUS

## 2023-06-14 NOTE — Transitions of Care (Post Inpatient/ED Visit) (Unsigned)
   06/14/2023  Name: Jeremy Hill MRN: [id number] DOB: October 30, 1945  Today's TOC FU Call Status: Today's TOC FU Call Status:: Unsuccessful Call (2nd Attempt) Unsuccessful Call (1st Attempt) Date: 06/14/23 Unsuccessful Call (2nd Attempt) Date: 06/14/23  Attempted to reach the patient regarding the most recent Inpatient/ED visit.  Follow Up Plan: Additional outreach attempts will be made to reach the patient to complete the Transitions of Care (Post Inpatient/ED visit) call.   Signature low grade fever

## 2023-06-14 NOTE — Discharge Instructions (Addendum)
 You have been seen and discharged from the emergency department.  Your blood work was normal for you.  Your CT scan did not show any acute findings.  There is note of incidental umbilical/inguinal hernias.  Follow-up with your primary doctor in regards to this.  Take nausea medicine as needed.  Follow-up bland diet and advance as tolerated.  Stable hydrated.  A home order for evaluation of our social work team has been placed for evaluation of PT and other supportive services.  Follow-up with your primary provider for further evaluation and further care. Take home medications as prescribed. If you have any worsening symptoms or further concerns for your health please return to an emergency department for further evaluation.

## 2023-06-14 NOTE — Transitions of Care (Post Inpatient/ED Visit) (Unsigned)
   06/14/2023  Name: GLYN GERADS MRN: [id number] DOB: 1945/03/11  Today's TOC FU Call Status: Today's TOC FU Call Status:: Unsuccessful Call (1st Attempt) Unsuccessful Call (1st Attempt) Date: 06/14/23  Attempted to reach the patient regarding the most recent Inpatient/ED visit.  Follow Up Plan: Additional outreach attempts will be made to reach the patient to complete the Transitions of Care (Post Inpatient/ED visit) call.   Signature Darrall Ellison, LPN [facility] Nurse Health Advisor Direct Dial [phone]

## 2023-06-14 NOTE — ED Triage Notes (Signed)
 Pt from home with ems c.o emesis today.  Recent MI, recently started on plavix and metoprolol. Pt denies chest pain, abd pain

## 2023-06-14 NOTE — ED Provider Notes (Addendum)
 [place] EMERGENCY DEPARTMENT AT [facility] Provider Note   CSN: [id number] Arrival date & time: 06/14/23  1200     History  Chief Complaint  Patient presents with   Emesis    Jeremy Hill is a 78 y.o. male.  HPI   78 year old male presents to the emergency department with nausea/vomiting.  Patient had a recent admission and discharge yesterday for MI which was managed medically.  States he woke up today feeling nauseous and had multiple episodes of nonbloody emesis.  Denies any specific abdominal pain or diarrhea.  Admits to feeling hot and having chills but no documented fever. No GU symptoms. At this time denies any chest pain, shortness of breath, back pain.  States has been trying to take his medications as directed in spite of the vomiting.  Home Medications Prior to Admission medications   Medication Sig Start Date End Date Taking? Authorizing Provider  amLODipine (NORVASC) 10 MG tablet Take 1 tablet (10 mg total) by mouth daily. Patient not taking: Reported on 06/11/2023 08/19/21   Briscoe Burns, MD  aspirin EC 81 MG tablet Take 1 tablet (81 mg total) by mouth daily. Swallow whole. 06/13/23   Azalee Course, PA  clopidogrel (PLAVIX) 75 MG tablet Take 1 tablet (75 mg total) by mouth daily. 06/13/23   Azalee Course, PA  hydrochlorothiazide (HYDRODIURIL) 25 MG tablet TAKE 1 TABLET(25 MG) BY MOUTH DAILY Patient taking differently: Take 25 mg by mouth daily. 11/13/22   Tyson Alias, MD  HYDROcodone-acetaminophen [facility]) 10-325 MG tablet Take 1 tablet by mouth every 8 (eight) hours as needed for severe pain (pain score 7-10). 05/13/23 05/11/24  Mercie Eon, MD  lisinopril (ZESTRIL) 40 MG tablet Take 1 tablet (40 mg total) by mouth daily. 01/13/23   Inez Catalina, MD  metoprolol tartrate (LOPRESSOR) 25 MG tablet Take 1 tablet (25 mg total) by mouth 2 (two) times daily. 06/13/23   Azalee Course, PA  rosuvastatin (CRESTOR) 40 MG tablet Take 1 tablet (40 mg total) by mouth  daily. 06/13/23 06/12/24  Azalee Course, PA  spironolactone (ALDACTONE) 25 MG tablet TAKE 1 TABLET(25 MG) BY MOUTH DAILY Patient not taking: Reported on 06/11/2023 10/20/21   Inez Catalina, MD      Allergies    Oxycodone    Review of Systems   Review of Systems  Constitutional:  Positive for appetite change, chills and fatigue. Negative for fever.  Respiratory:  Negative for shortness of breath.   Cardiovascular:  Negative for chest pain.  Gastrointestinal:  Positive for nausea and vomiting. Negative for abdominal pain and diarrhea.  Skin:  Negative for rash.  Neurological:  Negative for headaches.    Physical Exam Updated Vital Signs BP (!) 152/95   Pulse (!) 54   Temp 98.4 F (36.9 C) (Oral)   Resp 15   SpO2 99%  Physical Exam Vitals and nursing note reviewed.  Constitutional:      General: He is not in acute distress.    Appearance: Normal appearance.  HENT:     Head: Normocephalic.     Mouth/Throat:     Mouth: Mucous membranes are moist.  Cardiovascular:     Rate and Rhythm: Normal rate.     Comments: Equal radial pulses Pulmonary:     Effort: Pulmonary effort is normal. No respiratory distress.  Abdominal:     General: Bowel sounds are normal.     Palpations: Abdomen is soft.     Tenderness: There is  no abdominal tenderness. There is no guarding or rebound.  Skin:    General: Skin is warm.  Neurological:     Mental Status: He is alert and oriented to person, place, and time. Mental status is at baseline.  Psychiatric:        Mood and Affect: Mood normal.     ED Results / Procedures / Treatments   Labs (all labs ordered are listed, but only abnormal results are displayed) Labs Reviewed  COMPREHENSIVE METABOLIC PANEL WITH GFR - Abnormal; Notable for the following components:      Result Value   Glucose, Bld 153 (*)    Creatinine, Ser 1.28 (*)    Total Protein 8.3 (*)    GFR, Estimated 58 (*)    All other components within normal limits  TROPONIN I (HIGH  SENSITIVITY) - Abnormal; Notable for the following components:   Troponin I (High Sensitivity) 37 (*)    All other components within normal limits  TROPONIN I (HIGH SENSITIVITY) - Abnormal; Notable for the following components:   Troponin I (High Sensitivity) 36 (*)    All other components within normal limits  LIPASE, BLOOD  CBC    EKG EKG Interpretation Date/Time:  Monday June 14 2023 17:14:56 EDT Ventricular Rate:  78 PR Interval:  203 QRS Duration:  94 QT Interval:  433 QTC Calculation: 494 R Axis:   -47  Text Interpretation: Sinus rhythm Ventricular premature complex Left anterior fascicular block Abnormal R-wave progression, late transition Abnormal T, consider ischemia, diffuse leads ST elevation, consider inferior injury similar to previous Confirmed by Coralee Pesa [phone]) on 06/14/2023 5:35:31 PM  Radiology No results found.  Procedures Procedures    Medications Ordered in ED Medications  ondansetron (ZOFRAN) injection 4 mg (has no administration in time range)  sodium chloride 0.9 % bolus 500 mL (has no administration in time range)  iohexol (OMNIPAQUE) 350 MG/ML injection 100 mL (100 mLs Intravenous Contrast Given 06/14/23 1511)    ED Course/ Medical Decision Making/ A&P                                 Medical Decision Making Amount and/or Complexity of Data Reviewed Labs: ordered.  Risk Prescription drug management.   78 year old male presents to the emergency department with multiple episodes of nonbloody emesis today.  Was just discharged yesterday for MI that was treated medically.  Presented at that time with upper abdominal pain.  Currently he is pain-free.  Was endorsing feeling hot with chills.  No diarrhea.  No chest pain, shortness of breath or ongoing abdominal pain.  EKG is similar to his previous.  Blood work is reassuring.  Troponin is slightly elevated (37>36) but down from recent admission, and flat. Without any abdominal or CP I doubt  vomiting is a symptom of ACS. Doubt PE and dissection.   CT imaging shows no acute finding, does mention incidental hernias which has been relayed to the patient.  Patient was treated symptomatically and has been able to p.o. without difficulty.  Again denies any ongoing discomfort.  Patient lives home alone.  He feels safe and currently has no interest in rehab like facility which is consistent with when he was discharged.  However he has excepted a face-to-face order for evaluation of possible PT/home aide.  Patient at this time appears safe and stable for discharge and close outpatient follow up. Discharge plan and strict return to ED precautions  discussed, patient verbalizes understanding and agreement.        Final Clinical Impression(s) / ED Diagnoses Final diagnoses:  None    Rx / DC Orders ED Discharge Orders     None          Flonnie Humphrey, DO 06/14/23 2301

## 2023-06-14 NOTE — ED Provider Triage Note (Signed)
 Emergency Medicine Provider Triage Evaluation Note  Jeremy Hill , a 78 y.o. male  was evaluated in triage.  Pt complains of abdominal pain, recent stemi admission w/o stenting managed medically, d/c but having abd pain and nausea. Radial access.  Review of Systems  Positive: Abd pain , nausea  Negative: Chest pain, dyspnea  Physical Exam  BP (!) 155/96 (BP Location: Left Arm)   Pulse 70   Temp 97.7 F (36.5 C) (Oral)   Resp 16   SpO2 97%  Gen:   Awake, no distress   Resp:  Normal effort  MSK:   Moves extremities without difficulty  Other:  Mild diffuse ttp  Medical Decision Making  Medically screening exam initiated at 12:49 PM.  Appropriate orders placed.  Jon Negri was informed that the remainder of the evaluation will be completed by another provider, this initial triage assessment does not replace that evaluation, and the importance of remaining in the ED until their evaluation is complete.     Mordecai Applebaum, MD 06/14/23 1250

## 2023-06-15 NOTE — Transitions of Care (Post Inpatient/ED Visit) (Signed)
   06/15/2023  Name: Jeremy Hill MRN: [id number] DOB: 1945/04/02  Today's TOC FU Call Status: Today's TOC FU Call Status:: Unsuccessful Call (3rd Attempt) Unsuccessful Call (1st Attempt) Date: 06/14/23 Unsuccessful Call (2nd Attempt) Date: 06/14/23 Unsuccessful Call (3rd Attempt) Date: 06/15/23  Attempted to reach the patient regarding the most recent Inpatient/ED visit.  Follow Up Plan: No further outreach attempts will be made at this time. We have been unable to contact the patient.  Signature Darrall Ellison, LPN [facility] Nurse Health Advisor Direct Dial [phone]

## 2023-06-15 NOTE — ED Provider Notes (Signed)
 [facility] CARE CENTER   [id number] 06/10/23 Arrival Time: 1812  ASSESSMENT & PLAN:  1. Pain of upper abdomen    Unclear etiology. Appears to be in much pain. To ED for further evaluation. Declines EMS. Sent by POV; stable upon discharge.  Meds ordered this encounter  Medications   DISCONTD: ketorolac (TORADOL) 30 MG/ML injection 30 mg   ketorolac (TORADOL) 30 MG/ML injection 15 mg     Reviewed expectations re: course of current medical issues. Questions answered. Outlined signs and symptoms indicating need for more acute intervention. Patient verbalized understanding. After Visit Summary given.   SUBJECTIVE: History from: patient.  Jeremy Hill is a 78 y.o. male who presents abdominal pain with tightness and distension since this am. States unable to get comfortable today. Last NBM was this am. States only had ginger ale today. Tolerated. Pain worse today. Denies n/v/d. States has been urinating a lot today. Denies specific CP/SOB.  No tx PTA.   Past Surgical History:  Procedure Laterality Date   CYSTOSCOPY N/A 10/28/2018   Procedure: CYSTOSCOPY;  Surgeon: Ihor Gully, MD;  Location: [facility];  Service: Urology;  Laterality: N/A;  No seeds seen per Dr. Vernie Ammons   HERNIA REPAIR     LEFT HEART CATH AND CORONARY ANGIOGRAPHY N/A 06/11/2023   Procedure: LEFT HEART CATH AND CORONARY ANGIOGRAPHY;  Surgeon: Orbie Pyo, MD;  Location: [facility] INVASIVE CV LAB;  Service: Cardiovascular;  Laterality: N/A;   LYMPH NODE BIOPSY  01/2002   right inguinal node biopsy (done secondary to finding of neutropenia and lymphadenopathy) -  REACTIVE LYMPHOID HYPERPLASIA WITH SINUS HISTIOCYTOSIS AND PLASMACYTOSIS, no evidence of malignancy   PROSTATE BIOPSY     RADIOACTIVE SEED IMPLANT N/A 10/28/2018   Procedure: RADIOACTIVE SEED IMPLANT/BRACHYTHERAPY IMPLANT;  Surgeon: Ihor Gully, MD;  Location: [facility];  Service: Urology;  Laterality: N/A;   RHINOPLASTY      SPACE OAR INSTILLATION N/A 10/28/2018   Procedure: SPACE OAR INSTILLATION;  Surgeon: Ihor Gully, MD;  Location: [facility];  Service: Urology;  Laterality: N/A;     OBJECTIVE:  Vitals:   06/10/23 1853  BP: (!) 166/101  Pulse: 88  Resp: 20  Temp: 100 F (37.8 C)  TempSrc: Oral  SpO2: 93%    General appearance: alert; no distress Oropharynx: moist Lungs: clear to auscultation bilaterally; unlabored Heart: regular rate and rhythm Abdomen: soft; non-distended; with reported TTP over upper abdomen; no masses or organomegaly; no guarding or rebound tenderness Back: no CVA tenderness Extremities: no edema; symmetrical with no gross deformities Skin: warm; dry Neurologic: normal gait Psychological: alert and cooperative; normal mood and affect  Labs: Results for orders placed or performed during the hospital encounter of 06/10/23  POC urinalysis dipstick   Collection Time: 06/10/23  7:11 PM  Result Value Ref Range   Color, UA yellow yellow   Clarity, UA clear clear   Glucose, UA negative negative mg/dL   Bilirubin, UA negative negative   Ketones, POC UA negative negative mg/dL   Spec Grav, UA 6.213 0.865 - 1.025   Blood, UA negative negative   pH, UA 7.5 5.0 - 8.0   Protein Ur, POC negative negative mg/dL   Urobilinogen, UA 1.0 0.2 or 1.0 E.U./dL   Nitrite, UA Negative Negative   Leukocytes, UA Negative Negative  POC CBG monitoring   Collection Time: 06/10/23  7:18 PM  Result Value Ref Range   POCT Glucose ([facility]) 108 (A) 70 - 99 mg/dL  Labs Reviewed  POCT FASTING CBG [facility] MANUAL ENTRY - Abnormal; Notable for the following components:      Result Value   POCT Glucose ([facility]) 108 (*)    All other components within normal limits  POCT URINALYSIS DIP (MANUAL ENTRY)    Allergies  Allergen Reactions   Oxycodone     States makes him nervous and sick.                                               Past Medical History:  Diagnosis Date   Anemia 2008    borderline low Hg/Hct = 12.6/39.6 (01/10/2007), no anemia panel available and patient had refused colonoscopy, last colonoscopy done was in 2005 and the results were normal, done by Dr. Loreta Ave   Depression    Hypertension    Neutropenia 2008   noted on cbc (01/10/2007) WBC = 3.9, also CBC done 08/2006 showed WBC = 3.0, unclear etiology; CXR done 08/2006 showed questionable right lung nodule and the follow up CT was recommended, there is no data in the EChart that it was done   Osteomyelitis (HCC) 12/2001   per MRI of the spine - GIVEN THE ABNORMALITY AT THE T8-9 LEVEL, INFECTION AT THESE LATTER REGIONS CANNOT BE COMPLETELY EXCLUDED AND  WILL NEED TO BE FOLLOWED CLOSELY. SCATTERED DEGENERATIVE CHANGES IN THE LOWER  THORACIC/LUMBAR SPINE  AT THE L4-5 SIGNIFICANT NEURAL FORAMINAL NARROWING (R>L).  DECREASED SIGNAL INTENSITY OF BONE MARROW. UNDERLYING ANEMIA/INFILTRATIVE PROCESS/ LYMPHOMA.    Osteomyelitis, chronic (HCC) 2004   persistant osteomyelitis per MRI 04/2002 and also progressive at the same level as in 2003 T8-9 level   Prostate cancer [facility])    Social History   Socioeconomic History   Marital status: Divorced    Spouse name: Not on file   Number of children: 3   Years of education: Not on file   Highest education level: Not on file  Occupational History    Comment: retired  Tobacco Use   Smoking status: Never   Smokeless tobacco: Never  Vaping Use   Vaping status: Never Used  Substance and Sexual Activity   Alcohol use: No    Alcohol/week: 0.0 standard drinks of alcohol   Drug use: No   Sexual activity: Not Currently  Other Topics Concern   Not on file  Social History Narrative   Recently divorced.    Social Drivers of Corporate investment banker Strain: Low Risk  (04/17/2022)   Overall Financial Resource Strain (CARDIA)    Difficulty of Paying Living Expenses: Not hard at all  Food Insecurity: Food Insecurity Present (06/11/2023)   Hunger Vital Sign    Worried About  Running Out of Food in the Last Year: Never true    Ran Out of Food in the Last Year: Sometimes true  Transportation Needs: No Transportation Needs (06/11/2023)   PRAPARE - Administrator, Civil Service (Medical): No    Lack of Transportation (Non-Medical): No  Physical Activity: Patient Declined (04/17/2022)   Exercise Vital Sign    Days of Exercise per Week: Patient declined    Minutes of Exercise per Session: Patient declined  Stress: No Stress Concern Present (04/17/2022)   [employer] of Occupational Health - Occupational Stress Questionnaire    Feeling of Stress : Not at all  Social Connections: Moderately Integrated (06/11/2023)  Social Advertising account executive [NHANES]    Frequency of Communication with Friends and Family: More than three times a week    Frequency of Social Gatherings with Friends and Family: More than three times a week    Attends Religious Services: More than 4 times per year    Active Member of Clubs or Organizations: Yes    Attends Banker Meetings: More than 4 times per year    Marital Status: Divorced  Intimate Partner Violence: Not At Risk (06/11/2023)   Humiliation, Afraid, Rape, and Kick questionnaire    Fear of Current or Ex-Partner: No    Emotionally Abused: No    Physically Abused: No    Sexually Abused: No   Family History  Problem Relation Age of Onset   Heart disease Mother    Heart disease Father    Diabetes Sister    Breast cancer Sister    Suicidality Brother    Prostate cancer Neg Hx    Colon cancer Neg Hx    Pancreatic cancer Neg Hx       Afton Albright, MD 06/15/23 1028

## 2023-06-15 NOTE — Telephone Encounter (Signed)
 Copied from CRM [id number]. Topic: General - Other >> Jun 15, 2023  2:35 PM Suzette B wrote: Reason for CRM: Patient called [facility] he stated he had missed the call. After reviewing his chart I was able to see where the nurse had tried several times to call him but the call attempts were unsuccessful. I call to transfer the patient over but was unable to reach the nurse direct line. Left a vm for the nurse to call the patient. I attempted to give the patient the number but he stated he didn't have anything to write with at the time. Patient is awaiting the nurse call in regards to [facility]

## 2023-06-17 ENCOUNTER — Other Ambulatory Visit: Payer: Self-pay | Admitting: Internal Medicine

## 2023-06-17 DIAGNOSIS — M4800 Spinal stenosis, site unspecified: Secondary | ICD-10-CM

## 2023-06-17 NOTE — Telephone Encounter (Signed)
 Copied from CRM [id number]. Topic: Clinical - Medication Refill >> Jun 17, 2023  9:56 AM Latavia C wrote: Most Recent Primary Care Visit:  Provider: Buzz Cass B  Department: [facility]  Visit Type: OPEN ESTABLISHED  Date: 01/13/2023  Medication: HYDROcodone-acetaminophen (NORCO) 10-325 MG tablet  Has the patient contacted their pharmacy? No Patient was advised by the office to call in on a monthly basis when the medication needs a refill  Is this the correct pharmacy for this prescription? Yes If no, delete pharmacy and type the correct one.  This is the patient's preferred pharmacy:  [facility] [phone] - [place], [place] - [address] AT [facility] [address] [place] [address] Phone: [phone] Fax: [phone]   Has the prescription been filled recently? No  Is the patient out of the medication? No  Has the patient been seen for an appointment in the last year OR does the patient have an upcoming appointment? Yes  Can we respond through MyChart? No  Agent: Please be advised that Rx refills may take up to 3 business days. We ask that you follow-up with your pharmacy.

## 2023-06-21 ENCOUNTER — Other Ambulatory Visit: Payer: Self-pay | Admitting: *Deleted

## 2023-06-21 ENCOUNTER — Telehealth: Payer: Self-pay

## 2023-06-21 ENCOUNTER — Encounter: Payer: Self-pay | Admitting: *Deleted

## 2023-06-21 ENCOUNTER — Telehealth: Payer: Self-pay | Admitting: *Deleted

## 2023-06-21 DIAGNOSIS — I2129 ST elevation (STEMI) myocardial infarction involving other sites: Secondary | ICD-10-CM

## 2023-06-21 DIAGNOSIS — I10 Essential (primary) hypertension: Secondary | ICD-10-CM

## 2023-06-21 DIAGNOSIS — M4800 Spinal stenosis, site unspecified: Secondary | ICD-10-CM

## 2023-06-21 NOTE — Progress Notes (Signed)
 Opened in Error.

## 2023-06-21 NOTE — Progress Notes (Signed)
 Complex Care Management Note  Care Guide Note 06/21/2023 Name: Jeremy Hill MRN: [id number] DOB: 03/27/1945  Jeremy Hill is a 78 y.o. year old male who sees Driscilla George, MD for primary care. I reached out to Jeremy Hill by phone today to offer complex care management services.  [name] was given information about Complex Care Management services today including:   The Complex Care Management services include support from the care team which includes your Nurse Care Manager, Clinical Social Worker, or Pharmacist.  The Complex Care Management team is here to help remove barriers to the health concerns and goals most important to you. Complex Care Management services are voluntary, and the patient may decline or stop services at any time by request to their care team member.   Complex Care Management Consent Status: Patient agreed to services and verbal consent obtained.   Follow up plan:  Telephone appointment with complex care management team member scheduled for:  4/25  Encounter Outcome:  Patient Scheduled  Barnie Bora  [facility]  [facility] Guide  Direct Dial: [phone]  Fax [phone]

## 2023-06-21 NOTE — Telephone Encounter (Signed)
 Last rx written - 05/13/23. Last OV -01/13/23. Next OV - 06/23/23.

## 2023-06-21 NOTE — Telephone Encounter (Signed)
 Copied from CRM [id number]. Topic: Clinical - Medication Refill >> Jun 21, 2023 10:34 AM [name] wrote: Patient wants a call at [phone] once medication refill forHYDROcodone-acetaminophen  (NORCO) 10-325 MG tablet has been sent

## 2023-06-21 NOTE — Telephone Encounter (Signed)
 Last rx written - 05/13/23. Last OV - 01/13/23. Next OV - 4/23.

## 2023-06-22 MED ORDER — HYDROCODONE-ACETAMINOPHEN 10-325 MG PO TABS: 1.0000 | ORAL_TABLET | Freq: Three times a day (TID) | ORAL | 0 refills | Status: DC | PRN

## 2023-06-22 NOTE — Progress Notes (Unsigned)
 [facility]: Clinic Note  Subjective:  History of Present Illness: Jeremy Hill is a 78 y.o. year old male who presents for hospital follow up. He was Dr. Jay Meth patient, this is our first visit.  Hospitalized 4/10 for STEMI 2/2 posterolateral MI 2/2 OM2 occlusion, medically managed   Cards follow up on 5/2  HTN - amlo 10 - hydrochlorothiazide  25 - lisin 40 - metop tar 25 BID - spiro 25  CAD - asa 81 - plavix  75 - rosuva 40   MISC - norco       Please refer to Assessment and Plan below for full details in Problem-Based Charting.   Past Medical History:  Patient Active Problem List   Diagnosis Date Noted   Abdominal pain 06/13/2023   STEMI (ST elevation myocardial infarction) (HCC) 06/11/2023   Syphilis in male 08/14/2022   Malignant neoplasm of prostate (HCC) 06/08/2018   Overweight (BMI 25.0-29.9) 04/16/2017   HLD (hyperlipidemia) 03/17/2017   Central retinal vein occlusion, left eye 09/18/2015   BPH (benign prostatic hyperplasia) 05/11/2015   Psoriasis 12/12/2012   Routine health maintenance 04/02/2011   Essential hypertension 12/17/2005   Spinal stenosis 12/17/2005      Medications:  Current Outpatient Medications:    amLODipine  (NORVASC ) 10 MG tablet, Take 1 tablet (10 mg total) by mouth daily. (Patient not taking: Reported on 06/11/2023), Disp: 90 tablet, Rfl: 3   aspirin  EC 81 MG tablet, Take 1 tablet (81 mg total) by mouth daily. Swallow whole., Disp: , Rfl:    clopidogrel  (PLAVIX ) 75 MG tablet, Take 1 tablet (75 mg total) by mouth daily., Disp: 90 tablet, Rfl: 1   hydrochlorothiazide  (HYDRODIURIL ) 25 MG tablet, TAKE 1 TABLET(25 MG) BY MOUTH DAILY (Patient taking differently: Take 25 mg by mouth daily.), Disp: 90 tablet, Rfl: 3   HYDROcodone -acetaminophen  (NORCO) 10-325 MG tablet, Take 1 tablet by mouth every 8 (eight) hours as needed for severe pain (pain score 7-10)., Disp: 90 tablet, Rfl: 0   lisinopril  (ZESTRIL ) 40 MG  tablet, Take 1 tablet (40 mg total) by mouth daily., Disp: 90 tablet, Rfl: 3   metoprolol  tartrate (LOPRESSOR ) 25 MG tablet, Take 1 tablet (25 mg total) by mouth 2 (two) times daily., Disp: 180 tablet, Rfl: 3   ondansetron  (ZOFRAN -ODT) 4 MG disintegrating tablet, Take 1 tablet (4 mg total) by mouth every 8 (eight) hours as needed for nausea or vomiting., Disp: 20 tablet, Rfl: 0   rosuvastatin  (CRESTOR ) 40 MG tablet, Take 1 tablet (40 mg total) by mouth daily., Disp: 90 tablet, Rfl: 3   spironolactone  (ALDACTONE ) 25 MG tablet, TAKE 1 TABLET(25 MG) BY MOUTH DAILY (Patient not taking: Reported on 06/11/2023), Disp: 90 tablet, Rfl: 3   Allergies: Allergies  Allergen Reactions   Oxycodone      States makes him nervous and sick.       Objective:   Vitals: There were no vitals filed for this visit.   Physical Exam: Physical Exam   Data: Labs, imaging, and micro were reviewed in Epic. Refer to Assessment and Plan below for full details in Problem-Based Charting.  Assessment & Plan:  No problem-specific Assessment & Plan notes found for this encounter.     Patient will follow up in ***  Driscilla George, MD

## 2023-06-22 NOTE — Telephone Encounter (Signed)
 Hydrocodone  rx filled 06/22/23.

## 2023-06-23 ENCOUNTER — Encounter: Payer: Self-pay | Admitting: Internal Medicine

## 2023-06-23 ENCOUNTER — Ambulatory Visit (INDEPENDENT_AMBULATORY_CARE_PROVIDER_SITE_OTHER): Admitting: Internal Medicine

## 2023-06-23 VITALS — BP 116/69 | HR 67 | Temp 97.8°F | Ht 73.0 in | Wt 210.2 lb

## 2023-06-23 DIAGNOSIS — I10 Essential (primary) hypertension: Secondary | ICD-10-CM | POA: Diagnosis not present

## 2023-06-23 DIAGNOSIS — I2510 Atherosclerotic heart disease of native coronary artery without angina pectoris: Secondary | ICD-10-CM | POA: Insufficient documentation

## 2023-06-23 DIAGNOSIS — F430 Acute stress reaction: Secondary | ICD-10-CM | POA: Diagnosis not present

## 2023-06-23 DIAGNOSIS — M48061 Spinal stenosis, lumbar region without neurogenic claudication: Secondary | ICD-10-CM

## 2023-06-23 MED ORDER — ASPIRIN 81 MG PO TBEC: 81.0000 mg | DELAYED_RELEASE_TABLET | Freq: Every day | ORAL | 3 refills | Status: DC

## 2023-06-23 MED ORDER — METOPROLOL SUCCINATE ER 25 MG PO TB24: 25.0000 mg | ORAL_TABLET | Freq: Every day | ORAL | 3 refills | Status: AC

## 2023-06-23 NOTE — Patient Instructions (Addendum)
  Thank you, [name] for allowing us  to provide your care today. Today we discussed how you are doing after your hospitalization. We focused on your medicines.   You should be on 8 medicines in your pill box to take every morning PLUS the Norco and Ondansetron  to take as needed for pain or nausea.   The 8 medicines are: Aspirin  81mg  daily - Pick this up from your pharmacy Plavix  75mg  daily - You have this medicine in your pillbox  Amlodipine  10mg  daily - You have this medicine your pillbox  Lisinopril  40mg  daily - You have this medicine in your pillbox Rosuvasatin 40mg  daily - You have this medicine in your pillbox Hydrochlorothiazide  25mg  daily - This is not in your pillbox, but you have it at home. Call me if you don't have this at home. Spironolactone  25mg  daily - This is not in your pillbox, but you have it at home. Call me if you don't have this at home. Metoprolol  XL 25mg  daily - You have the short-acting version of this medicine at home and in your pillbox. Pick up the new medicine from your pharmacy. For next week's pill box, use the new prescription for Metoprolol , not the old one.     I have ordered the following labs for you:   Lab Orders         BMP8+Anion Gap       Referrals ordered today:   Referral Orders  No referral(s) requested today     I have ordered the following medication/changed the following medications:   Start the following medications: Meds ordered this encounter  Medications   aspirin  EC 81 MG tablet    Sig: Take 1 tablet (81 mg total) by mouth daily. Swallow whole.    Dispense:  90 tablet    Refill:  3   metoprolol  succinate (TOPROL -XL) 25 MG 24 hr tablet    Sig: Take 1 tablet (25 mg total) by mouth daily.    Dispense:  90 tablet    Refill:  3     Return in about 4 weeks (around 07/21/2023) for Blood pressure; CAD.    Remember: Please bring your pill bottles and your pill box to your next appointment.   Should you have any  questions or concerns please call the internal medicine clinic at [phone].     Kellie Murrill, MD Faculty, Internal Medicine Teaching Prog[facility]

## 2023-06-23 NOTE — Assessment & Plan Note (Signed)
-   Patient is having increased worry and feeling overwhelmed after his recent heart attack. I will place [facility] referral to help with LCSW to talk to him about his feelings

## 2023-06-23 NOTE — Assessment & Plan Note (Signed)
-   Chronic and stable - Continue Norco. I moved it from his pill box to the "PM" section with the prn medicines

## 2023-06-23 NOTE — Assessment & Plan Note (Addendum)
-   Patient was hospitalized for STEMI recently that was medically managed.  - Start aspirin  81mg  daily - Continue Plavix  75mg  daily - Continue Rosuvastatin  40mg  daily  - Continue Lisinopril  40mg  daily - Change Metoprolol  Tartrate 25mg  (he was taking once daily) to Metoprolol  Succinate 25mg  daily  - BMP today  - Cardiology follow up scheduled next Friday 5/2

## 2023-06-23 NOTE — Assessment & Plan Note (Signed)
-   Blood pressure is well controlled today - Continue Amlodipine  10mg  daily, hydrochlorothiazide  25mg  daily, Lisinopril  40mg  daily, Spironolactone  25mg  daily, and Metoprolol  XL 25mg  daily

## 2023-06-24 LAB — BMP8+ANION GAP
Anion Gap: 13 mmol/L (ref 10.0–18.0)
BUN/Creatinine Ratio: 20 (ref 10–24)
BUN: 33 mg/dL — ABNORMAL HIGH (ref 8–27)
CO2: 24 mmol/L (ref 20–29)
Calcium: 10 mg/dL (ref 8.6–10.2)
Chloride: 97 mmol/L (ref 96–106)
Creatinine, Ser: 1.65 mg/dL — ABNORMAL HIGH (ref 0.76–1.27)
Glucose: 108 mg/dL — ABNORMAL HIGH (ref 70–99)
Potassium: 5.2 mmol/L (ref 3.5–5.2)
Sodium: 134 mmol/L (ref 134–144)
eGFR: 43 mL/min/{1.73_m2} — ABNORMAL LOW (ref >59)

## 2023-06-24 NOTE — Progress Notes (Signed)
 Cardiology Office Note    Patient Name: Jeremy Hill Date of Encounter: 06/24/2023  Primary Care Provider:  Driscilla George, MD Primary Cardiologist:  Kyra Phy, MD Primary Electrophysiologist: None   Past Medical History    Past Medical History:  Diagnosis Date   Anemia 2008   borderline low Hg/Hct = 12.6/39.6 (01/10/2007), no anemia panel available and patient had refused colonoscopy, last colonoscopy done was in 2005 and the results were normal, done by Dr. Tova Fresh   Depression    Hypertension    Neutropenia 2008   noted on cbc (01/10/2007) WBC = 3.9, also CBC done 08/2006 showed WBC = 3.0, unclear etiology; CXR done 08/2006 showed questionable right lung nodule and the follow up CT was recommended, there is no data in the EChart that it was done   Osteomyelitis (HCC) 12/2001   per MRI of the spine - GIVEN THE ABNORMALITY AT THE T8-9 LEVEL, INFECTION AT THESE LATTER REGIONS CANNOT BE COMPLETELY EXCLUDED AND  WILL NEED TO BE FOLLOWED CLOSELY. SCATTERED DEGENERATIVE CHANGES IN THE LOWER  THORACIC/LUMBAR SPINE  AT THE L4-5 SIGNIFICANT NEURAL FORAMINAL NARROWING (R>L).  DECREASED SIGNAL INTENSITY OF BONE MARROW. UNDERLYING ANEMIA/INFILTRATIVE PROCESS/ LYMPHOMA.    Osteomyelitis, chronic (HCC) 2004   persistant osteomyelitis per MRI 04/2002 and also progressive at the same level as in 2003 T8-9 level   Prostate cancer [facility])    STEMI (ST elevation myocardial infarction) (HCC) 06/11/2023    History of Present Illness  Jeremy Hill is a 78 y.o. male with a PMH of CAD s/p posterior STEMI 06/2023, HTN, HLD, prostate CA central retinal vein occlusion 2017 who presents today for post LHC follow-up.  Jeremy Hill presented to the ED on 06/10/23 with severe 10 out of 10 upper abdominal pain from right to left and was evaluated at the urgent care and was referred to the ED.  EKG was completed showing posterior STEMI and cardiology was consulted.  He underwent a CTA that was negative for PE and was  given ASA 324 mg and underwent emergent LHC.  Left heart cath was completed showing tortuous but patent coronary arteries with small subbranch of obtuse marginal with recommendation of medical therapy and aggressive BP control.  2D echo was completed with 55 to 60% and mild concentric LVH with grade 1 DD and mild to moderate dilation of the LAD with mild calcification of the AV.  He was started on low-dose metoprolol  as well as ASA and Plavix  x 6 months and Crestor  was increased to 40 mg.  Jeremy Hill presents today for post hospital follow-up. He was discharged in good condition with medications including metoprolol , increased Crestor  to 40 mg, spironolactone , lisinopril , hydrochlorothiazide , and Norvasc . He experienced nausea and vomiting post-discharge, which has since resolved. He is currently taking nine medications, some of which are temporary and some long-term. He has not started cardiac rehab and is not currently exercising due to fear of falling, although he has progressed from using a walker to a cane. He feels stronger but still experiences slight balance issues. He has a history of elevated kidney function and high potassium levels, which were noted during his hospital stay. His cholesterol levels were elevated, with an LDL of 105, and he is currently taking Ensure to help with nutrition. No return of chest pain since discharge and he feels stronger, although he cannot perform activities he used to do. Patient denies chest pain, palpitations, dyspnea, PND, orthopnea, nausea, vomiting, dizziness, syncope, edema, weight gain, or  early satiety.  Discussed the use of AI scribe software for clinical note transcription with the patient, who gave verbal consent to proceed.  History of Present Illness    Review of Systems  Please see the history of present illness.    All other systems reviewed and are otherwise negative except as noted above.  Physical Exam    Wt Readings from Last 3 Encounters:   06/23/23 210 lb 3.2 oz (95.3 kg)  06/11/23 220 lb 7.4 oz (100 kg)  01/13/23 217 lb (98.4 kg)   WU:JWJXB were no vitals filed for this visit.,There is no height or weight on file to calculate BMI. GEN: Well nourished, well developed in no acute distress Neck: No JVD; No carotid bruits Pulmonary: Clear to auscultation without rales, wheezing or rhonchi  Cardiovascular: Normal rate. Regular rhythm. Normal S1. Normal S2.   Murmurs: There is no murmur.  ABDOMEN: Soft, non-tender, non-distended EXTREMITIES:  No edema; No deformity   EKG/LABS/ Recent Cardiac Studies   ECG personally reviewed by me today -none completed today  Risk Assessment/Calculations:          Lab Results  Component Value Date   WBC 8.7 06/14/2023   HGB 14.0 06/14/2023   HCT 43.4 06/14/2023   MCV 87.1 06/14/2023   PLT 292 06/14/2023   Lab Results  Component Value Date   CREATININE 1.65 (H) 06/23/2023   BUN 33 (H) 06/23/2023   NA 134 06/23/2023   K 5.2 06/23/2023   CL 97 06/23/2023   CO2 24 06/23/2023   Lab Results  Component Value Date   CHOL 167 06/11/2023   HDL 56 06/11/2023   LDLCALC 105 (H) 06/11/2023   TRIG 31 06/11/2023   CHOLHDL 3.0 06/11/2023    Lab Results  Component Value Date   HGBA1C 5.2 06/10/2023   Assessment & Plan    1.  Coronary artery disease: -s/p posterior MI on 06/10/2023 with LHC performed showing patent coronaries 100% lateral second OM subbranch occlusion treated medically - Today Jeremy Hill reports no angina or chest pain today -Continue Plavix  for 6 months, then switch to aspirin  monotherapy. - Prescribe nitroglycerin  for emergency use: sublingually every 5 minutes up to 3 doses for chest pain, call 911 if pain persists. - Refer to cardiac rehabilitation for structured exercise program.  2.  Essential hypertension: -Hypertension well-controlled with current medications. Potential to reduce pill burden by combining medications. - Continue current antihypertensive  regimen: spironolactone , metoprolol , -We will discontinue lisinopril  40 mg today -Patient will start Exforge  5-160-25 milligrams - We will check BMET today and BMET in 2 weeks.  3.  Hyperlipidemia: -Hyperlipidemia with LDL cholesterol at 105 mg/dL, target is less than 70 mg/dL. Crestor  dosage increased to 40 mg during hospitalization - Continue Crestor  40 mg. - Check cholesterol levels in 5 weeks  4.  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    Cardiac Rehabilitation Eligibility Assessment       Disposition: Follow-up with Arun K Thukkani, MD or APP in 3 months    Signed, Francene Ing, Retha Cast, NP 06/24/2023, 12:48 PM [facility]

## 2023-06-24 NOTE — Progress Notes (Signed)
 Called patient to review labs x2. No answer, left voicemail instructing him to call clinic back.  His kidney function is a little down since he left the hospital.  I encourage him to drink more water  and to avoid any medicines like aleve  or ibuprofen .   I'd like his cardiologist to repeat his blood pressure and labs at his upcoming appointment next week.  We may need to decrease some of his blood pressure medicine, depending on those results.

## 2023-06-25 ENCOUNTER — Other Ambulatory Visit: Payer: Self-pay | Admitting: Licensed Clinical Social Worker

## 2023-06-25 NOTE — Patient Outreach (Signed)
 Complex Care Management   Visit Note  06/25/2023  Name:  Jeremy Hill MRN: [id number] DOB: 1945-11-30  Situation: Referral received for Complex Care Management related to  Acute Stress  I obtained verbal consent from Patient.  Visit completed with patient  on the phone  Background:   Past Medical History:  Diagnosis Date   Anemia 2008   borderline low Hg/Hct = 12.6/39.6 (01/10/2007), no anemia panel available and patient had refused colonoscopy, last colonoscopy done was in 2005 and the results were normal, done by Dr. Tova Fresh   Depression    Hypertension    Neutropenia 2008   noted on cbc (01/10/2007) WBC = 3.9, also CBC done 08/2006 showed WBC = 3.0, unclear etiology; CXR done 08/2006 showed questionable right lung nodule and the follow up CT was recommended, there is no data in the EChart that it was done   Osteomyelitis (HCC) 12/2001   per MRI of the spine - GIVEN THE ABNORMALITY AT THE T8-9 LEVEL, INFECTION AT THESE LATTER REGIONS CANNOT BE COMPLETELY EXCLUDED AND  WILL NEED TO BE FOLLOWED CLOSELY. SCATTERED DEGENERATIVE CHANGES IN THE LOWER  THORACIC/LUMBAR SPINE  AT THE L4-5 SIGNIFICANT NEURAL FORAMINAL NARROWING (R>L).  DECREASED SIGNAL INTENSITY OF BONE MARROW. UNDERLYING ANEMIA/INFILTRATIVE PROCESS/ LYMPHOMA.    Osteomyelitis, chronic (HCC) 2004   persistant osteomyelitis per MRI 04/2002 and also progressive at the same level as in 2003 T8-9 level   Prostate cancer [facility])    STEMI (ST elevation myocardial infarction) (HCC) 06/11/2023    Assessment: Patient Reported Symptoms:  Cognitive        Neurological      HEENT HEENT Symptoms Reported: No symptoms reported      Cardiovascular Cardiovascular Symptoms Reported: No symptoms reported Cardiovascular Management Strategies: Coping strategies, Routine screening, Medication therapy  Respiratory Respiratory Symptoms Reported: No symptoms reported    Endocrine Patient reports the following symptoms related to hypoglycemia or  hyperglycemia : No symptoms reported    Gastrointestinal Gastrointestinal Symptoms Reported: No symptoms reported      Genitourinary Genitourinary Symptoms Reported: No symptoms reported    Integumentary Integumentary Symptoms Reported: No symptoms reported    Musculoskeletal Musculoskelatal Symptoms Reviewed: No symptoms reported Musculoskeletal Management Strategies: Activity, Exercise, Adequate rest Falls in the past year?: No Number of falls in past year: 1 or less Was there an injury with Fall?: No Fall Risk Category Calculator: 0 Patient Fall Risk Level: Low Fall Risk    Psychosocial Psychosocial Symptoms Reported: No symptoms reported Additional Psychological Details: Reports staying active and checking in with friends regularly helps keep his mood high Behavioral Management Strategies: Activity, Adequate rest, Community resources, Coping strategies, Support system Behavioral Health Self-Management Outcome: 4 (good) Techniques to [employer] with Loss/Stress/Change: Counseling, Spiritual practice(s) Quality of Family Relationships: helpful, involved, supportive Do you feel physically threatened by others?: No      06/25/2023   11:36 AM  Depression screen PHQ 2/9  Decreased Interest 0  Down, Depressed, Hopeless 0  PHQ - 2 Score 0    There were no vitals filed for this visit.  Medications Reviewed Today     Reviewed by Jens Molder, LCSW (Social Worker) on 06/25/23 at 1119  Med List Status: <None>   Medication Order Taking? Sig Documenting Provider Last Dose Status Informant  amLODipine  (NORVASC ) 10 MG tablet [id number] No Take 1 tablet (10 mg total) by mouth daily.  Patient not taking: Reported on 06/11/2023   Jinwala, Sagar H, MD Not Taking Active Self, Pharmacy Records  aspirin  EC 81 MG tablet [id number] Yes Take 1 tablet (81 mg total) by mouth daily. Swallow whole. Driscilla George, MD Taking Active   clopidogrel  (PLAVIX ) 75 MG tablet [id number] Yes Take 1 tablet (75 mg  total) by mouth daily. Meng, Hao, [place] Taking Active   hydrochlorothiazide  (HYDRODIURIL ) 25 MG tablet [id number] Yes TAKE 1 TABLET(25 MG) BY MOUTH DAILY  Patient taking differently: Take 25 mg by mouth daily.   Ether Hercules, MD Taking Active Self, Pharmacy Records  HYDROcodone -acetaminophen  [facility]) 10-325 MG tablet [id number] Yes Take 1 tablet by mouth every 8 (eight) hours as needed for severe pain (pain score 7-10). Driscilla George, MD Taking Active   lisinopril  (ZESTRIL ) 40 MG tablet [id number] Yes Take 1 tablet (40 mg total) by mouth daily. Rosezetta Contras, MD Taking Active Self, Pharmacy Records  metoprolol  succinate (TOPROL -XL) 25 MG 24 hr tablet [id number] Yes Take 1 tablet (25 mg total) by mouth daily. Driscilla George, MD Taking Active   ondansetron  (ZOFRAN -ODT) 4 MG disintegrating tablet [id number] No Take 1 tablet (4 mg total) by mouth every 8 (eight) hours as needed for nausea or vomiting.  Patient not taking: Reported on 06/25/2023   Horton, Sidra Dredge, DO Not Taking Active   rosuvastatin  (CRESTOR ) 40 MG tablet [id number] Yes Take 1 tablet (40 mg total) by mouth daily. Ervin Heath, [place] Taking Active   spironolactone  (ALDACTONE ) 25 MG tablet [id number] No TAKE 1 TABLET(25 MG) BY MOUTH DAILY  Patient not taking: No sig reported   Rosezetta Contras, MD Not Taking Active Self, Pharmacy Records            Recommendation:   Continue taking medications as prescribed. Continue practicing coping skills and reaching out to community supports as needed. Monitor for changes in mood.  Follow Up Plan:   Patient has met all care management goals. Care Management case will be closed. Patient has been provided contact information should new needs arise.   Hale Level, LCSW [place], [facility] Licensed Clinical Social Worker Care Coordinator [phone]

## 2023-06-25 NOTE — Patient Instructions (Signed)
 Visit Information  Thank you for taking time to visit with me today. Please don't hesitate to contact me if I can be of assistance to you before our next scheduled appointment.   Please call the care guide team at [phone] if you need to cancel or reschedule your appointment.   Following is a copy of your care plan:   Goals Addressed             This Visit's Progress    COMPLETED: VBCI Social Work Care Plan       Problems:   EMMI Sad  CSW Clinical Goal(s):   Over the next 6 weeks the Patient will attend all scheduled medical appointments as evidenced by patient report and care team review of appointment completion in electronic MEDICAL RECORD NUMBER  demonstrate a reduction in symptoms related to Acute Stress Disorder .  Interventions:  Mental Health:  Evaluation of current treatment plan related to  Acute Stress Active listening / Reflection utilized Behavioral Activation reviewed Motivational Interviewing employed PHQ2/PHQ9 completed Provided general psycho-education for mental health needs  Patient Goals/Self-Care Activities:  Continue taking your medication as prescribed.   Continue practicing coping skills regularly and reaching out to community supports.  Plan:   No further follow up required: Please re-refer with any new needs assessed.        Please call the Suicide and Crisis Lifeline: 988 if you are experiencing a Mental Health or Behavioral Health Crisis or need someone to talk to.  Patient verbalizes understanding of instructions and care plan provided today and agrees to view in MyChart. Active MyChart status and patient understanding of how to access instructions and care plan via MyChart confirmed with patient.     Hale Level, LCSW [place], [facility] Licensed Clinical Social Worker Care Coordinator [phone]

## 2023-07-02 ENCOUNTER — Encounter: Payer: Self-pay | Admitting: Nurse Practitioner

## 2023-07-02 ENCOUNTER — Ambulatory Visit: Attending: Cardiology | Admitting: Nurse Practitioner

## 2023-07-02 VITALS — BP 116/70 | HR 55 | Ht 73.0 in | Wt 210.8 lb

## 2023-07-02 DIAGNOSIS — I10 Essential (primary) hypertension: Secondary | ICD-10-CM | POA: Diagnosis not present

## 2023-07-02 DIAGNOSIS — I2510 Atherosclerotic heart disease of native coronary artery without angina pectoris: Secondary | ICD-10-CM

## 2023-07-02 DIAGNOSIS — E785 Hyperlipidemia, unspecified: Secondary | ICD-10-CM | POA: Diagnosis not present

## 2023-07-02 DIAGNOSIS — H348122 Central retinal vein occlusion, left eye, stable: Secondary | ICD-10-CM | POA: Diagnosis not present

## 2023-07-02 MED ORDER — NITROGLYCERIN 0.4 MG SL SUBL: 0.4000 mg | SUBLINGUAL_TABLET | SUBLINGUAL | 3 refills | Status: AC | PRN

## 2023-07-02 MED ORDER — AMLODIPINE-VALSARTAN-HCTZ 5-160-25 MG PO TABS
1.0000 | ORAL_TABLET | Freq: Every day | ORAL | 2 refills | Status: DC
Start: 2023-07-02 — End: 2023-07-21

## 2023-07-02 NOTE — Patient Instructions (Addendum)
 Medication Instructions:  STOP Norvasc  STOP Lisinopril  STOP Hydrochlorothiazide  START Exforge (Norvasc -Valsartan-Hydrochlorothiazide ) 5-160-25mg  Take 1 tablet once a day  START Nitroglycerin  0.4mg  Take 1 as needed for emergency chest pain. Take first dose for emergency chest pain; WAIT 5 minutes and then take 2nd dose. IF still having pain if still having chest pain CALL 911 wait an additional 5 minutes before taking final dose. Do not take more than 3 doses in a day.  *If you need a refill on your cardiac medications before your next appointment, please call your pharmacy*  Lab Work: TODAY-BMET 5 WEEKS FASTING LIPIDS & LFTS If you have labs (blood work) drawn today and your tests are completely normal, you will receive your results only by: MyChart Message (if you have MyChart) OR A paper copy in the mail If you have any lab test that is abnormal or we need to change your treatment, we will call you to review the results.  Testing/Procedures: NONE ORDERED  Follow-Up: At [facility], you and your health needs are our priority.  As part of our continuing mission to provide you with exceptional heart care, our providers are all part of one team.  This team includes your primary Cardiologist (physician) and Advanced Practice Providers or APPs (Physician Assistants and Nurse Practitioners) who all work together to provide you with the care you need, when you need it.  Your next appointment:   3 month(s)  Provider:   Charles Connor, NP        We recommend signing up for the patient portal called "MyChart".  Sign up information is provided on this After Visit Summary.  MyChart is used to connect with patients for Virtual Visits (Telemedicine).  Patients are able to view lab/test results, encounter notes, upcoming appointments, etc.  Non-urgent messages can be sent to your provider as well.   To learn more about what you can do with MyChart, go to [email or web address].   Other  Instructions You have been referred to Cardiac Rehab.  Low-Sodium Eating Plan Salt (sodium) helps you keep a healthy balance of fluids in your body. Too much sodium can raise your blood pressure. It can also cause fluid and waste to be held in your body. Your health care provider or dietitian may recommend a low-sodium eating plan if you have high blood pressure (hypertension), kidney disease, liver disease, or heart failure. Eating less sodium can help lower your blood pressure and reduce swelling. It can also protect your heart, liver, and kidneys. What are tips for following this plan? Reading food labels  Check food labels for the amount of sodium per serving. If you eat more than one serving, you must multiply the listed amount by the number of servings. Choose foods with less than 140 milligrams (mg) of sodium per serving. Avoid foods with 300 mg of sodium or more per serving. Always check how much sodium is in a product, even if the label says "unsalted" or "no salt added." Shopping  Buy products labeled as "low-sodium" or "no salt added." Buy fresh foods. Avoid canned foods and pre-made or frozen meals. Avoid canned, cured, or processed meats. Buy breads that have less than 80 mg of sodium per slice. Cooking  Eat more home-cooked food. Try to eat less restaurant, buffet, and fast food. Try not to add salt when you cook. Use salt-free seasonings or herbs instead of table salt or sea salt. Check with your provider or pharmacist before using salt substitutes. Cook with plant-based oils, such as  canola, sunflower, or olive oil. Meal planning When eating at a restaurant, ask if your food can be made with less salt or no salt. Avoid dishes labeled as brined, pickled, cured, or smoked. Avoid dishes made with soy sauce, miso, or teriyaki sauce. Avoid foods that have monosodium glutamate (MSG) in them. MSG may be added to some restaurant food, sauces, soups, bouillon, and canned  foods. Make meals that can be grilled, baked, poached, roasted, or steamed. These are often made with less sodium. General information Try to limit your sodium intake to 1,500-2,300 mg each day, or the amount told by your provider. What foods should I eat? Fruits Fresh, frozen, or canned fruit. Fruit juice. Vegetables Fresh or frozen vegetables. "No salt added" canned vegetables. "No salt added" tomato sauce and paste. Low-sodium or reduced-sodium tomato and vegetable juice. Grains Low-sodium cereals, such as oats, puffed wheat and rice, and shredded wheat. Low-sodium crackers. Unsalted rice. Unsalted pasta. Low-sodium bread. Whole grain breads and whole grain pasta. Meats and other proteins Fresh or frozen meat, poultry, seafood, and fish. These should have no added salt. Low-sodium canned tuna and salmon. Unsalted nuts. Dried peas, beans, and lentils without added salt. Unsalted canned beans. Eggs. Unsalted nut butters. Dairy Milk. Soy milk. Cheese that is naturally low in sodium, such as ricotta cheese, fresh mozzarella, or Swiss cheese. Low-sodium or reduced-sodium cheese. Cream cheese. Yogurt. Seasonings and condiments Fresh and dried herbs and spices. Salt-free seasonings. Low-sodium mustard and ketchup. Sodium-free salad dressing. Sodium-free light mayonnaise. Fresh or refrigerated horseradish. Lemon juice. Vinegar. Other foods Homemade, reduced-sodium, or low-sodium soups. Unsalted popcorn and pretzels. Low-salt or salt-free chips. The items listed above may not be all the foods and drinks you can have. Talk to a dietitian to learn more. What foods should I avoid? Vegetables Sauerkraut, pickled vegetables, and relishes. Olives. [place] fries. Onion rings. Regular canned vegetables, except low-sodium or reduced-sodium items. Regular canned tomato sauce and paste. Regular tomato and vegetable juice. Frozen vegetables in sauces. Grains Instant hot cereals. Bread stuffing, pancake, and  biscuit mixes. Croutons. Seasoned rice or pasta mixes. Noodle soup cups. Boxed or frozen macaroni and cheese. Regular salted crackers. Self-rising flour. Meats and other proteins Meat or fish that is salted, canned, smoked, spiced, or pickled. Precooked or cured meat, such as sausages or meat loaves. Helene Loader. Ham. Pepperoni. Hot dogs. Corned beef. Chipped beef. Salt pork. Jerky. Pickled herring, anchovies, and sardines. Regular canned tuna. Salted nuts. Dairy Processed cheese and cheese spreads. Hard cheeses. Cheese curds. Blue cheese. Feta cheese. String cheese. Regular cottage cheese. Buttermilk. Canned milk. Fats and oils Salted butter. Regular margarine. Ghee. Bacon fat. Seasonings and condiments Onion salt, garlic salt, seasoned salt, table salt, and sea salt. Canned and packaged gravies. Worcestershire sauce. Tartar sauce. Barbecue sauce. Teriyaki sauce. Soy sauce, including reduced-sodium soy sauce. Steak sauce. Fish sauce. Oyster sauce. Cocktail sauce. Horseradish that you find on the shelf. Regular ketchup and mustard. Meat flavorings and tenderizers. Bouillon cubes. Hot sauce. Pre-made or packaged marinades. Pre-made or packaged taco seasonings. Relishes. Regular salad dressings. Salsa. Other foods Salted popcorn and pretzels. Corn chips and puffs. Potato and tortilla chips. Canned or dried soups. Pizza. Frozen entrees and pot pies. The items listed above may not be all the foods and drinks you should avoid. Talk to a dietitian to learn more. This information is not intended to replace advice given to you by your health care provider. Make sure you discuss any questions you have with your health care provider.  Document Revised: 03/05/2022 Document Reviewed: 03/05/2022 Elsevier Patient Education  2024 [employer]  Heart-Healthy Eating Plan Eating a healthy diet is important for the health of your heart. A heart-healthy eating plan includes: Eating less unhealthy fats. Eating more healthy  fats. Eating less salt in your food. Salt is also called sodium. Making other changes in your diet. Talk with your doctor or a diet specialist (dietitian) to create an eating plan that is right for you. What is my plan? Your doctor may recommend an eating plan that includes: Total fat: ______% or less of total calories a day. Saturated fat: ______% or less of total calories a day. Cholesterol: less than _________mg a day. Sodium: less than _________mg a day. What are tips for following this plan? Cooking Avoid frying your food. Try to bake, boil, grill, or broil it instead. You can also reduce fat by: Removing the skin from poultry. Removing all visible fats from meats. Steaming vegetables in water  or broth. Meal planning  At meals, divide your plate into four equal parts: Fill one-half of your plate with vegetables and green salads. Fill one-fourth of your plate with whole grains. Fill one-fourth of your plate with lean protein foods. Eat 2-4 cups of vegetables per day. One cup of vegetables is: 1 cup (91 g) broccoli or cauliflower florets. 2 medium carrots. 1 large bell pepper. 1 large sweet potato. 1 large tomato. 1 medium white potato. 2 cups (150 g) raw leafy greens. Eat 1-2 cups of fruit per day. One cup of fruit is: 1 small apple 1 large banana 1 cup (237 g) mixed fruit, 1 large orange,  cup (82 g) dried fruit, 1 cup (240 mL) 100% fruit juice. Eat more foods that have soluble fiber. These are apples, broccoli, carrots, beans, peas, and barley. Try to get 20-30 g of fiber per day. Eat 4-5 servings of nuts, legumes, and seeds per week: 1 serving of dried beans or legumes equals  cup (90 g) cooked. 1 serving of nuts is  oz (12 almonds, 24 pistachios, or 7 walnut halves). 1 serving of seeds equals  oz (8 g). General information Eat more home-cooked food. Eat less restaurant, buffet, and fast food. Limit or avoid alcohol. Limit foods that are high in starch and  sugar. Avoid fried foods. Lose weight if you are overweight. Keep track of how much salt (sodium) you eat. This is important if you have high blood pressure. Ask your doctor to tell you more about this. Try to add vegetarian meals each week. Fats Choose healthy fats. These include olive oil and canola oil, flaxseeds, walnuts, almonds, and seeds. Eat more omega-3 fats. These include salmon, mackerel, sardines, tuna, flaxseed oil, and ground flaxseeds. Try to eat fish at least 2 times each week. Check food labels. Avoid foods with trans fats or high amounts of saturated fat. Limit saturated fats. These are often found in animal products, such as meats, butter, and cream. These are also found in plant foods, such as palm oil, palm kernel oil, and coconut oil. Avoid foods with partially hydrogenated oils in them. These have trans fats. Examples are stick margarine, some tub margarines, cookies, crackers, and other baked goods. What foods should I eat? Fruits All fresh, canned (in natural juice), or frozen fruits. Vegetables Fresh or frozen vegetables (raw, steamed, roasted, or grilled). Green salads. Grains Most grains. Choose whole wheat and whole grains most of the time. Rice and pasta, including brown rice and pastas made with whole wheat. Meats  and other proteins Lean, well-trimmed beef, veal, pork, and lamb. Chicken and [place] without skin. All fish and shellfish. Wild duck, rabbit, pheasant, and venison. Egg whites or low-cholesterol egg substitutes. Dried beans, peas, lentils, and tofu. Seeds and most nuts. Dairy Low-fat or nonfat cheeses, including ricotta and mozzarella. Skim or 1% milk that is liquid, powdered, or evaporated. Buttermilk that is made with low-fat milk. Nonfat or low-fat yogurt. Fats and oils Non-hydrogenated (trans-free) margarines. Vegetable oils, including soybean, sesame, sunflower, olive, peanut, safflower, corn, canola, and cottonseed. Salad dressings or mayonnaise  made with a vegetable oil. Beverages Mineral water . Coffee and tea. Diet carbonated beverages. Sweets and desserts Sherbet, gelatin, and fruit ice. Small amounts of dark chocolate. Limit all sweets and desserts. Seasonings and condiments All seasonings and condiments. The items listed above may not be a complete list of foods and drinks you can eat. Contact a dietitian for more options. What foods should I avoid? Fruits Canned fruit in heavy syrup. Fruit in cream or butter sauce. Fried fruit. Limit coconut. Vegetables Vegetables cooked in cheese, cream, or butter sauce. Fried vegetables. Grains Breads that are made with saturated or trans fats, oils, or whole milk. Croissants. Sweet rolls. Donuts. High-fat crackers, such as cheese crackers. Meats and other proteins Fatty meats, such as hot dogs, ribs, sausage, bacon, rib-eye roast or steak. High-fat deli meats, such as salami and bologna. Caviar. Domestic duck and goose. Organ meats, such as liver. Dairy Cream, sour cream, cream cheese, and creamed cottage cheese. Whole-milk cheeses. Whole or 2% milk that is liquid, evaporated, or condensed. Whole buttermilk. Cream sauce or high-fat cheese sauce. Yogurt that is made from whole milk. Fats and oils Meat fat, or shortening. Cocoa butter, hydrogenated oils, palm oil, coconut oil, palm kernel oil. Solid fats and shortenings, including bacon fat, salt pork, lard, and butter. Nondairy cream substitutes. Salad dressings with cheese or sour cream. Beverages Regular sodas and juice drinks with added sugar. Sweets and desserts Frosting. Pudding. Cookies. Cakes. Pies. Milk chocolate or white chocolate. Buttered syrups. Full-fat ice cream or ice cream drinks. The items listed above may not be a complete list of foods and drinks to avoid. Contact a dietitian for more information. Summary Heart-healthy meal planning includes eating less unhealthy fats, eating more healthy fats, and making other changes  in your diet. Eat a balanced diet. This includes fruits and vegetables, low-fat or nonfat dairy, lean protein, nuts and legumes, whole grains, and heart-healthy oils and fats. This information is not intended to replace advice given to you by your health care provider. Make sure you discuss any questions you have with your health care provider. Document Revised: 03/24/2021 Document Reviewed: 03/24/2021 Elsevier Patient Education  2024 [employer].

## 2023-07-03 LAB — BASIC METABOLIC PANEL WITH GFR
BUN/Creatinine Ratio: 19 (ref 10–24)
BUN: 33 mg/dL — ABNORMAL HIGH (ref 8–27)
CO2: 24 mmol/L (ref 20–29)
Calcium: 10.2 mg/dL (ref 8.6–10.2)
Chloride: 100 mmol/L (ref 96–106)
Creatinine, Ser: 1.7 mg/dL — ABNORMAL HIGH (ref 0.76–1.27)
Glucose: 108 mg/dL — ABNORMAL HIGH (ref 70–99)
Potassium: 4.6 mmol/L (ref 3.5–5.2)
Sodium: 139 mmol/L (ref 134–144)
eGFR: 42 mL/min/{1.73_m2} — ABNORMAL LOW (ref >59)

## 2023-07-05 NOTE — Progress Notes (Signed)
 Reviewed Cardiology's note: Cr 1.72, up from 1.28 at discharge and 1.65 at my visit. They referred to cardiac rehab.  They stopped lisinopril , started exforge (amlodipine -valsartan)

## 2023-07-06 ENCOUNTER — Other Ambulatory Visit: Payer: Self-pay

## 2023-07-06 DIAGNOSIS — I2510 Atherosclerotic heart disease of native coronary artery without angina pectoris: Secondary | ICD-10-CM

## 2023-07-06 DIAGNOSIS — E785 Hyperlipidemia, unspecified: Secondary | ICD-10-CM

## 2023-07-06 DIAGNOSIS — H348122 Central retinal vein occlusion, left eye, stable: Secondary | ICD-10-CM

## 2023-07-06 DIAGNOSIS — I10 Essential (primary) hypertension: Secondary | ICD-10-CM

## 2023-07-08 ENCOUNTER — Ambulatory Visit: Payer: No Typology Code available for payment source

## 2023-07-08 VITALS — Ht 73.0 in | Wt 210.0 lb

## 2023-07-08 DIAGNOSIS — Z0000 Encounter for general adult medical examination without abnormal findings: Secondary | ICD-10-CM | POA: Diagnosis not present

## 2023-07-08 NOTE — Patient Instructions (Signed)
 [name] , Thank you for taking time out of your busy schedule to complete your Annual Wellness Visit with me. I enjoyed our conversation and look forward to speaking with you again next year. I, as well as your care team,  appreciate your ongoing commitment to your health goals. Please review the following plan we discussed and let me know if I can assist you in the future. Your Game plan/ To Do List     Follow up Visits: Next Medicare AWV with our clinical staff: 07/14/2023 at 4:10 pm PHONE VISIT   Have you seen your provider in the last 6 months (3 months if uncontrolled diabetes)? Yes Next Office Visit with your provider: 07/21/2023 at 1:45 p.m. with Dr. Jonelle Neri  Clinician Recommendations:  Aim for 30 minutes of exercise or brisk walking, 6-8 glasses of water , and 5 servings of fruits and vegetables each day.       This is a list of the screening recommended for you and due dates:  Health Maintenance  Topic Date Due   Zoster (Shingles) Vaccine (1 of 2) Never done   COVID-19 Vaccine (3 - Moderna risk series) 06/10/2019   DTaP/Tdap/Td vaccine (2 - Td or Tdap) 03/15/2022   Colon Cancer Screening  01/12/2024*   Stool Blood Test  09/14/2023   Flu Shot  10/01/2023   Medicare Annual Wellness Visit  07/07/2024   Pneumonia Vaccine  Completed   Hepatitis C Screening  Completed   HPV Vaccine  Aged Out   Meningitis B Vaccine  Aged Out  *Topic was postponed. The date shown is not the original due date.    Advanced directives: (Declined) Advance directive discussed with you today. Even though you declined this today, please call our office should you change your mind, and we can give you the proper paperwork for you to fill out. Advance Care Planning is important because it:  [x]  Makes sure you receive the medical care that is consistent with your values, goals, and preferences  [x]  It provides guidance to your family and loved ones and reduces their decisional burden about whether or not  they are making the right decisions based on your wishes.  Follow the link provided in your after visit summary or read over the paperwork we have mailed to you to help you started getting your Advance Directives in place. If you need assistance in completing these, please reach out to us  so that we can help you!  See attachments for Preventive Care and Fall Prevention Tips.

## 2023-07-08 NOTE — Progress Notes (Signed)
 Because this visit was a virtual/telehealth visit,  certain criteria was not obtained, such a blood pressure, CBG if applicable, and timed get up and go. Any medications not marked as "taking" were not mentioned during the medication reconciliation part of the visit. Any vitals not documented were not able to be obtained due to this being a telehealth visit or patient was unable to self-report a recent blood pressure reading due to a lack of equipment at home via telehealth. Vitals that have been documented are verbally provided by the patient.   Subjective:   Jeremy Hill is a 78 y.o. who presents for a Medicare Wellness preventive visit.  As a reminder, Annual Wellness Visits don't include a physical exam, and some assessments may be limited, especially if this visit is performed virtually. We may recommend an in-person visit if needed.  Visit Complete: Virtual I connected with  Jeremy Hill on 07/08/23 by a audio enabled telemedicine application and verified that I am speaking with the correct person using two identifiers.  Patient Location: Home  Provider Location: Office/Clinic  I discussed the limitations of evaluation and management by telemedicine. The patient expressed understanding and agreed to proceed.  Vital Signs: Because this visit was a virtual/telehealth visit, some criteria may be missing or patient reported. Any vitals not documented were not able to be obtained and vitals that have been documented are patient reported.  VideoDeclined- This patient declined Librarian, academic. Therefore the visit was completed with audio only.  Persons Participating in Visit: Patient.  AWV Questionnaire: No: Patient Medicare AWV questionnaire was not completed prior to this visit.  Cardiac Risk Factors include: advanced age (>89men, >21 women);sedentary lifestyle;dyslipidemia;hypertension;male gender;family history of premature cardiovascular disease      Objective:     Today's Vitals   07/08/23 1533  Weight: 210 lb (95.3 kg)  Height: 6\' 1"  (1.854 m)  PainSc: 0-No pain   Body mass index is 27.71 kg/m.     07/08/2023    3:37 PM 06/11/2023    3:22 PM 06/10/2023   10:20 PM 01/13/2023    8:58 AM 08/28/2022    8:33 AM 08/14/2022   10:20 AM 04/17/2022   11:26 AM  Advanced Directives  Does Patient Have a Medical Advance Directive? No  No Yes No No No  Type of Advance Directive    Living will     Would patient like information on creating a medical advance directive? No - Patient declined Yes (Inpatient - patient requests chaplain consult to create a medical advance directive)   No - Patient declined No - Patient declined No - Patient declined    Current Medications (verified) Outpatient Encounter Medications as of 07/08/2023  Medication Sig   amLODIPine -Valsartan-HCTZ 5-160-25 MG TABS Take 1 tablet by mouth daily.   aspirin  EC 81 MG tablet Take 1 tablet (81 mg total) by mouth daily. Swallow whole.   clopidogrel  (PLAVIX ) 75 MG tablet Take 1 tablet (75 mg total) by mouth daily.   HYDROcodone -acetaminophen  (NORCO) 10-325 MG tablet Take 1 tablet by mouth every 8 (eight) hours as needed for severe pain (pain score 7-10).   metoprolol  succinate (TOPROL -XL) 25 MG 24 hr tablet Take 1 tablet (25 mg total) by mouth daily.   nitroGLYCERIN  (NITROSTAT ) 0.4 MG SL tablet Place 1 tablet (0.4 mg total) under the tongue every 5 (five) minutes as needed.   ondansetron  (ZOFRAN -ODT) 4 MG disintegrating tablet Take 1 tablet (4 mg total) by mouth every 8 (eight)  hours as needed for nausea or vomiting.   rosuvastatin  (CRESTOR ) 40 MG tablet Take 1 tablet (40 mg total) by mouth daily.   spironolactone  (ALDACTONE ) 25 MG tablet TAKE 1 TABLET(25 MG) BY MOUTH DAILY   No facility-administered encounter medications on file as of 07/08/2023.    Allergies (verified) Oxycodone    History: Past Medical History:  Diagnosis Date   Anemia 2008   borderline low Hg/Hct =  12.6/39.6 (01/10/2007), no anemia panel available and patient had refused colonoscopy, last colonoscopy done was in 2005 and the results were normal, done by Dr. Tova Fresh   Depression    Hypertension    Neutropenia 2008   noted on cbc (01/10/2007) WBC = 3.9, also CBC done 08/2006 showed WBC = 3.0, unclear etiology; CXR done 08/2006 showed questionable right lung nodule and the follow up CT was recommended, there is no data in the EChart that it was done   Osteomyelitis (HCC) 12/2001   per MRI of the spine - GIVEN THE ABNORMALITY AT THE T8-9 LEVEL, INFECTION AT THESE LATTER REGIONS CANNOT BE COMPLETELY EXCLUDED AND  WILL NEED TO BE FOLLOWED CLOSELY. SCATTERED DEGENERATIVE CHANGES IN THE LOWER  THORACIC/LUMBAR SPINE  AT THE L4-5 SIGNIFICANT NEURAL FORAMINAL NARROWING (R>L).  DECREASED SIGNAL INTENSITY OF BONE MARROW. UNDERLYING ANEMIA/INFILTRATIVE PROCESS/ LYMPHOMA.    Osteomyelitis, chronic (HCC) 2004   persistant osteomyelitis per MRI 04/2002 and also progressive at the same level as in 2003 T8-9 level   Prostate cancer [facility])    STEMI (ST elevation myocardial infarction) (HCC) 06/11/2023   Past Surgical History:  Procedure Laterality Date   CYSTOSCOPY N/A 10/28/2018   Procedure: CYSTOSCOPY;  Surgeon: Ottelin, Mark, MD;  Location: [facility];  Service: Urology;  Laterality: N/A;  No seeds seen per Dr. Grady Lawman   HERNIA REPAIR     LEFT HEART CATH AND CORONARY ANGIOGRAPHY N/A 06/11/2023   Procedure: LEFT HEART CATH AND CORONARY ANGIOGRAPHY;  Surgeon: Kyra Phy, MD;  Location: [facility];  Service: Cardiovascular;  Laterality: N/A;   LYMPH NODE BIOPSY  01/2002   right inguinal node biopsy (done secondary to finding of neutropenia and lymphadenopathy) -  REACTIVE LYMPHOID HYPERPLASIA WITH SINUS HISTIOCYTOSIS AND PLASMACYTOSIS, no evidence of malignancy   PROSTATE BIOPSY     RADIOACTIVE SEED IMPLANT N/A 10/28/2018   Procedure: RADIOACTIVE SEED IMPLANT/BRACHYTHERAPY IMPLANT;   Surgeon: Ottelin, Mark, MD;  Location: [facility];  Service: Urology;  Laterality: N/A;   RHINOPLASTY     SPACE OAR INSTILLATION N/A 10/28/2018   Procedure: SPACE OAR INSTILLATION;  Surgeon: Ottelin, Mark, MD;  Location: [facility];  Service: Urology;  Laterality: N/A;   Family History  Problem Relation Age of Onset   Heart disease Mother    Heart disease Father    Diabetes Sister    Breast cancer Sister    Suicidality Brother    Prostate cancer Neg Hx    Colon cancer Neg Hx    Pancreatic cancer Neg Hx    Social History   Socioeconomic History   Marital status: Divorced    Spouse name: Not on file   Number of children: 3   Years of education: Not on file   Highest education level: Not on file  Occupational History    Comment: retired  Tobacco Use   Smoking status: Never   Smokeless tobacco: Never  Vaping Use   Vaping status: Never Used  Substance and Sexual Activity   Alcohol use: No  Alcohol/week: 0.0 standard drinks of alcohol   Drug use: No   Sexual activity: Not Currently  Other Topics Concern   Not on file  Social History Narrative   Recently divorced.    Social Drivers of Corporate investment banker Strain: Low Risk  (07/08/2023)   Overall Financial Resource Strain (CARDIA)    Difficulty of Paying Living Expenses: Not hard at all  Food Insecurity: No Food Insecurity (07/08/2023)   Hunger Vital Sign    Worried About Running Out of Food in the Last Year: Never true    Ran Out of Food in the Last Year: Never true  Recent Concern: Food Insecurity - Food Insecurity Present (06/11/2023)   Hunger Vital Sign    Worried About Running Out of Food in the Last Year: Never true    Ran Out of Food in the Last Year: Sometimes true  Transportation Needs: No Transportation Needs (07/08/2023)   PRAPARE - Administrator, Civil Service (Medical): No    Lack of Transportation (Non-Medical): No  Physical Activity: Inactive (07/08/2023)    Exercise Vital Sign    Days of Exercise per Week: 0 days    Minutes of Exercise per Session: 0 min  Stress: No Stress Concern Present (07/08/2023)   [employer] of Occupational Health - Occupational Stress Questionnaire    Feeling of Stress : Not at all  Social Connections: Moderately Integrated (07/08/2023)   Social Connection and Isolation Panel [NHANES]    Frequency of Communication with Friends and Family: More than three times a week    Frequency of Social Gatherings with Friends and Family: More than three times a week    Attends Religious Services: More than 4 times per year    Active Member of [employer] or Organizations: Yes    Attends Engineer, structural: More than 4 times per year    Marital Status: Divorced    Tobacco Counseling Counseling given: Not Answered    Clinical Intake:  Pre-visit preparation completed: Yes  Pain : No/denies pain Pain Score: 0-No pain     BMI - recorded: 27.71 Nutritional Status: BMI 25 -29 Overweight Nutritional Risks: None Diabetes: No  Lab Results  Component Value Date   HGBA1C 5.2 06/10/2023   HGBA1C 5.3 06/09/2016   HGBA1C 5.2 05/22/2008     How often do you need to have someone help you when you read instructions, pamphlets, or other written materials from your doctor or pharmacy?: 1 - Never  Interpreter Needed?: No  Information entered by :: Renaye Janicki N. Nature Vogelsang, LPN.   Activities of Daily Living     07/08/2023    3:38 PM 06/11/2023   10:00 AM  In your present state of health, do you have any difficulty performing the following activities:  Hearing? 0 0  Vision? 0 0  Difficulty concentrating or making decisions? 0 0  Comment BSE: GAMES ON PHONE, READING   Walking or climbing stairs? 1   Comment CANE   Dressing or bathing? 0   Doing errands, shopping? 0   Preparing Food and eating ? N   Using the Toilet? N   In the past six months, have you accidently leaked urine? N   Do you have problems with loss of  bowel control? N   Managing your Medications? N   Managing your Finances? N   Housekeeping or managing your Housekeeping? N     Patient Care Team: Driscilla George, MD as PCP - General (Internal Medicine)  Thukkani, Arun K, MD as PCP - Cardiology (Cardiology) Judyth Nunnery, RN Nurse Navigator as Registered Nurse (Medical Oncology)  Indicate any recent Medical Services you may have received from other than [facility] providers in the past year (date may be approximate).     Assessment:    This is a routine wellness examination for [place].  Hearing/Vision screen Hearing Screening - Comments:: Denies hearing difficulties.  Vision Screening - Comments:: No rx glasses - not up to date with routine eye exams.    Goals Addressed               This Visit's Progress     Patient Stated (pt-stated)        07/08/2023: To enjoy my life, stay healthy and become a millionaire.       Depression Screen     07/08/2023    3:35 PM 06/25/2023   11:36 AM 01/13/2023    8:58 AM 08/28/2022    8:32 AM 08/14/2022   10:20 AM 04/17/2022   11:26 AM 04/17/2022    9:26 AM  PHQ 2/9 Scores  PHQ - 2 Score 0 0 0 0 0 0 0  PHQ- 9 Score 0    0      Fall Risk     07/08/2023    3:38 PM 06/25/2023   11:22 AM 01/13/2023    8:58 AM 08/28/2022    8:32 AM 08/14/2022   10:20 AM  Fall Risk   Falls in the past year? 0 0 0 0 0  Number falls in past yr: 0 0 0 0   Injury with Fall? 0 0 0 0   Risk for fall due to : No Fall Risks  No Fall Risks No Fall Risks No Fall Risks  Follow up Falls prevention discussed;Falls evaluation completed  Falls prevention discussed;Falls evaluation completed Falls evaluation completed;Falls prevention discussed Falls evaluation completed    MEDICARE RISK AT HOME:  Medicare Risk at Home Any stairs in or around the home?: No If so, are there any without handrails?: No Home free of loose throw rugs in walkways, pet beds, electrical cords, etc?: Yes Adequate lighting in your home to reduce risk of  falls?: Yes Life alert?: No Use of a cane, walker or w/c?: Yes Grab bars in the bathroom?: No Shower chair or bench in shower?: No Elevated toilet seat or a handicapped toilet?: No  TIMED UP AND GO:  Was the test performed?  No  Cognitive Function: 6CIT completed    07/08/2023    3:53 PM  MMSE - Mini Mental State Exam  Not completed: Unable to complete        07/08/2023    3:40 PM 04/17/2022   11:26 AM  6CIT Screen  What Year? 0 points 0 points  What month? 0 points 0 points  What time? 0 points 0 points  Count back from 20 0 points 0 points  Months in reverse 0 points 0 points  Repeat phrase 0 points 0 points  Total Score 0 points 0 points    Immunizations Immunization History  Administered Date(s) Administered   Fluad Quad(high Dose 65+) 11/19/2020, 12/11/2021   Influenza Whole 12/10/2008, 01/06/2010   Influenza, Seasonal, Injecte, Preservative Fre 03/15/2012   Influenza,inj,Quad PF,6+ Mos 12/12/2012, 01/24/2014, 01/23/2015, 02/13/2016, 12/16/2016, 12/29/2017, 12/02/2018   Moderna Sars-Covid-2 Vaccination 04/15/2019, 05/13/2019   Pneumococcal Conjugate-13 01/23/2015   Pneumococcal Polysaccharide-23 06/09/2016   Tdap 03/15/2012    Screening Tests Health Maintenance  Topic Date Due  Zoster Vaccines- Shingrix (1 of 2) Never done   COVID-19 Vaccine (3 - Moderna risk series) 06/10/2019   DTaP/Tdap/Td (2 - Td or Tdap) 03/15/2022   Colonoscopy  01/12/2024 (Originally 03/10/2020)   COLON CANCER SCREENING ANNUAL FOBT  09/14/2023   INFLUENZA VACCINE  10/01/2023   Medicare Annual Wellness (AWV)  07/07/2024   Pneumonia Vaccine 101+ Years old  Completed   Hepatitis C Screening  Completed   HPV VACCINES  Aged Out   Meningococcal B Vaccine  Aged Out    Health Maintenance  Health Maintenance Due  Topic Date Due   Zoster Vaccines- Shingrix (1 of 2) Never done   COVID-19 Vaccine (3 - Moderna risk series) 06/10/2019   DTaP/Tdap/Td (2 - Td or Tdap) 03/15/2022   Health  Maintenance Items Addressed: Yes Patient aware of current care gaps.  Patient is due for the following vaccines: DTaP and Shingrix. Colonoscopy postponed until 01/12/2024.  Additional Screening:  Vision Screening: Recommended annual ophthalmology exams for early detection of glaucoma and other disorders of the eye.  Dental Screening: Recommended annual dental exams for proper oral hygiene  Community Resource Referral / Chronic Care Management: CRR required this visit?  No   CCM required this visit?  No   Plan:    I have personally reviewed and noted the following in the patient's chart:   Medical and social history Use of alcohol, tobacco or illicit drugs  Current medications and supplements including opioid prescriptions. Patient is currently taking opioid prescriptions. Information provided to patient regarding non-opioid alternatives. Patient advised to discuss non-opioid treatment plan with their provider. Functional ability and status Nutritional status Physical activity Advanced directives List of other physicians Hospitalizations, surgeries, and ER visits in previous 12 months Vitals Screenings to include cognitive, depression, and falls Referrals and appointments  In addition, I have reviewed and discussed with patient certain preventive protocols, quality metrics, and best practice recommendations. A written personalized care plan for preventive services as well as general preventive health recommendations were provided to patient.   Margette Sheldon, LPN   04/08/2534   After Visit Summary: (Declined) Due to this being a telephonic visit, with patients personalized plan was offered to patient but patient Declined AVS at this time   Nurse Notes: Patient aware of current care gaps. Patient is due for the following vaccines: DTaP and Shingrix. Colonoscopy postponed until 01/12/2024.

## 2023-07-09 NOTE — Progress Notes (Signed)
 I reviewed the AWV findings of the licensed medical professional who conducted the visit. I was present in the office suite and immediately available to provide assistance and direction throughout the time the service was provided.

## 2023-07-14 ENCOUNTER — Ambulatory Visit: Payer: Self-pay

## 2023-07-20 ENCOUNTER — Telehealth (HOSPITAL_COMMUNITY): Payer: Self-pay

## 2023-07-20 ENCOUNTER — Other Ambulatory Visit: Payer: Self-pay | Admitting: Internal Medicine

## 2023-07-20 DIAGNOSIS — M4800 Spinal stenosis, site unspecified: Secondary | ICD-10-CM

## 2023-07-20 MED ORDER — HYDROCODONE-ACETAMINOPHEN 10-325 MG PO TABS: 1.0000 | ORAL_TABLET | Freq: Three times a day (TID) | ORAL | 0 refills | Status: DC | PRN

## 2023-07-20 NOTE — Telephone Encounter (Signed)
 Copied from CRM [id number]. Topic: Clinical - Medication Refill >> Jul 20, 2023  9:20 AM Bearl Botts A wrote: Medication: HYDROcodone -acetaminophen  (NORCO) 10-325 MG tablet  Has the patient contacted their pharmacy? Yes   This is the patient's preferred pharmacy:  [facility] [phone] - [place], [place] - [address] AT [facility] [address] [place] [address] Phone: [phone] Fax: [phone]  Is this the correct pharmacy for this prescription? Yes   Has the prescription been filled recently? No  Is the patient out of the medication? Yes  Has the patient been seen for an appointment in the last year OR does the patient have an upcoming appointment? Yes  Can we respond through MyChart? No  Agent: Please be advised that Rx refills may take up to 3 business days. We ask that you follow-up with your pharmacy.

## 2023-07-20 NOTE — Telephone Encounter (Signed)
 Called and spoke with pt in regards to CR, pt stated he has a lot of other health issues right now and would like to hold off with CR.   Closed referral

## 2023-07-20 NOTE — Telephone Encounter (Signed)
 A user error has taken place: encounter opened in error, closed for administrative reasons.

## 2023-07-21 ENCOUNTER — Encounter: Payer: Self-pay | Admitting: Student

## 2023-07-21 ENCOUNTER — Ambulatory Visit (INDEPENDENT_AMBULATORY_CARE_PROVIDER_SITE_OTHER): Admitting: Student

## 2023-07-21 VITALS — BP 91/63 | HR 71 | Temp 97.9°F | Ht 73.0 in | Wt 201.8 lb

## 2023-07-21 DIAGNOSIS — I10 Essential (primary) hypertension: Secondary | ICD-10-CM | POA: Diagnosis not present

## 2023-07-21 DIAGNOSIS — I2510 Atherosclerotic heart disease of native coronary artery without angina pectoris: Secondary | ICD-10-CM

## 2023-07-21 DIAGNOSIS — K5900 Constipation, unspecified: Secondary | ICD-10-CM

## 2023-07-21 DIAGNOSIS — N179 Acute kidney failure, unspecified: Secondary | ICD-10-CM | POA: Diagnosis not present

## 2023-07-21 MED ORDER — POLYETHYLENE GLYCOL 3350 17 G PO PACK: 17.0000 g | PACK | Freq: Every day | ORAL | 1 refills | Status: AC

## 2023-07-21 NOTE — Assessment & Plan Note (Addendum)
 Patient has a past medical history of hypertension.  Patient was recently started by his cardiologist on amlodipine -valsartan -HCTZ 5-160-25 mg daily.  Blood pressure today is 100/60.  Pulse of 55.  He does report feeling fatigued.  When inquiring about his medications.  He states he has been taking this medication as well as his old HCTZ medication.  He has also been taking metoprolol  succinate and tartrate at the same time.  I counseled him on which medications to take and which ones not to take.  I safely discarded his metoprolol  tartrate in his HCTZ.  Plan: - Given AKI will hold his amlodipine -valsartan -HCTZ for now, I will call him to resume - Obtain BMP - Discarded metoprolol  tartrate and hydrochlorothiazide  -Home health orders placed for medication management

## 2023-07-21 NOTE — Assessment & Plan Note (Addendum)
 Patient has a past medical history of CAD after being hospitalized in April 2025 for STEMI which showed 100% lateral second OM subbranch occlusion.  He was treated with medical therapy.  No stents were placed.  He was to continue aspirin  and Plavix  until November 2025 and then transition to aspirin  alone.  He does follow with his cardiologist.  He was recommended cardiac rehab, but did not go.  I recommended him to go.  He understood.  He denies any chest pain or shortness of breath since the incident.  He does report that he has been feeling fatigued.  When going over his medications he reports that he has been taking metoprolol  tartrate 25 mg daily as well as metoprolol  succinate 25 mg daily.  Unclear why patient was doing this.  This was changed at his last visit but he was unsure which 1 to take so he took both.  I think likely this is what is playing into his fatigue.  Could also be related to him recovering from his MI.  I did discard his metoprolol  tartrate as he should continue metoprolol  succinate.  I carefully went over all of his medications with him as well.  Plan: - Continue aspirin  81 mg daily - Continue Plavix  75 mg daily until November 2025 - Continue Crestor  40 mg daily -Continue metoprolol  succinate 25 mg daily

## 2023-07-21 NOTE — Patient Instructions (Signed)
 Jeremy Hill,Thank you for allowing me to take part in your care today.  Here are your instructions.  1.  Regarding your constipation.  I have written you for MiraLAX.  Please take it daily.  You can mix it in any drink.  If he cannot have a bowel movement in the next 48 hours, please take it twice a day.  2.  You have a medication list at the back of this page.  Please read the medication list and take your medications as prescribed.  3.  Hold your blood pressure medicine for the time being.  I will call you to resume it.  Thank you, Dr. Lydia Sams  If you have any other questions please contact the internal medicine clinic at [phone] If it is after hours, please call the [place] hospital at [phone] and then ask the person who picks up for the resident on call.

## 2023-07-21 NOTE — Assessment & Plan Note (Signed)
 Patient had elevated creatinine 1.70.  This AKI likely was secondary to his MI.  Will hold his amlodipine -valsartan -HCTZ for now.  Will recheck BMP.  Pan: - Check BMP - Hold amlodipine -valsartan -HCTZ for now

## 2023-07-21 NOTE — Assessment & Plan Note (Signed)
 Patient endorses being constipated.  Plan: - MiraLAX provided

## 2023-07-21 NOTE — Progress Notes (Signed)
 CC: Blood pressure and CAD follow up   HPI:  Jeremy Hill is a 78 y.o. male with past medical history of CAD, hypertension, prostate cancer, hyperlipidemia who presents for 1 month follow-up.  Please see assessment plan for full HPI.  Medications: Hypertension: Amlodipine -valsartan -HCTZ 5-160-25 tablet daily, metoprolol  succinate 25 mg daily CAD: Aspirin  81 mg daily, Plavix  75 mg daily Hyperlipidemia: Crestor  40 mg daily  Past Medical History:  Diagnosis Date   Anemia 2008   borderline low Hg/Hct = 12.6/39.6 (01/10/2007), no anemia panel available and patient had refused colonoscopy, last colonoscopy done was in 2005 and the results were normal, done by Dr. Tova Fresh   Depression    Hypertension    Neutropenia 2008   noted on cbc (01/10/2007) WBC = 3.9, also CBC done 08/2006 showed WBC = 3.0, unclear etiology; CXR done 08/2006 showed questionable right lung nodule and the follow up CT was recommended, there is no data in the EChart that it was done   Osteomyelitis (HCC) 12/2001   per MRI of the spine - GIVEN THE ABNORMALITY AT THE T8-9 LEVEL, INFECTION AT THESE LATTER REGIONS CANNOT BE COMPLETELY EXCLUDED AND  WILL NEED TO BE FOLLOWED CLOSELY. SCATTERED DEGENERATIVE CHANGES IN THE LOWER  THORACIC/LUMBAR SPINE  AT THE L4-5 SIGNIFICANT NEURAL FORAMINAL NARROWING (R>L).  DECREASED SIGNAL INTENSITY OF BONE MARROW. UNDERLYING ANEMIA/INFILTRATIVE PROCESS/ LYMPHOMA.    Osteomyelitis, chronic (HCC) 2004   persistant osteomyelitis per MRI 04/2002 and also progressive at the same level as in 2003 T8-9 level   Prostate cancer [facility])    STEMI (ST elevation myocardial infarction) (HCC) 06/11/2023     Current Outpatient Medications:    polyethylene glycol (MIRALAX) 17 g packet, Take 17 g by mouth daily., Disp: 30 packet, Rfl: 1   aspirin  EC 81 MG tablet, Take 1 tablet (81 mg total) by mouth daily. Swallow whole., Disp: 90 tablet, Rfl: 3   clopidogrel  (PLAVIX ) 75 MG tablet, Take 1 tablet (75 mg  total) by mouth daily., Disp: 90 tablet, Rfl: 1   HYDROcodone -acetaminophen  (NORCO) 10-325 MG tablet, Take 1 tablet by mouth every 8 (eight) hours as needed for severe pain (pain score 7-10)., Disp: 90 tablet, Rfl: 0   metoprolol  succinate (TOPROL -XL) 25 MG 24 hr tablet, Take 1 tablet (25 mg total) by mouth daily., Disp: 90 tablet, Rfl: 3   nitroGLYCERIN  (NITROSTAT ) 0.4 MG SL tablet, Place 1 tablet (0.4 mg total) under the tongue every 5 (five) minutes as needed., Disp: 25 tablet, Rfl: 3   ondansetron  (ZOFRAN -ODT) 4 MG disintegrating tablet, Take 1 tablet (4 mg total) by mouth every 8 (eight) hours as needed for nausea or vomiting., Disp: 20 tablet, Rfl: 0   rosuvastatin  (CRESTOR ) 40 MG tablet, Take 1 tablet (40 mg total) by mouth daily., Disp: 90 tablet, Rfl: 3  Review of Systems:   Negative except for what is stated in HPI  Physical Exam:  Vitals:   07/21/23 1356 07/21/23 1436 07/21/23 1439 07/21/23 1442  BP: 100/60 114/62 107/67 91/63  Pulse: (!) 55 (!) 51 60 71  Temp: 97.9 F (36.6 C)     TempSrc: Oral     SpO2: 100%     Weight: 201 lb 12.8 oz (91.5 kg)     Height: 6\' 1"  (1.854 m)      General: Patient is sitting comfortably in the room  Head: Normocephalic, atraumatic  Cardio: Regular rate and rhythm, no murmurs, rubs or gallops Pulmonary: Clear to ausculation bilaterally with no rales, rhonchi,  and crackles   Assessment & Plan:   CAD (coronary artery disease) Patient has a past medical history of CAD after being hospitalized in April 2025 for STEMI which showed 100% lateral second OM subbranch occlusion.  He was treated with medical therapy.  No stents were placed.  He was to continue aspirin  and Plavix  until November 2025 and then transition to aspirin  alone.  He does follow with his cardiologist.  He was recommended cardiac rehab, but did not go.  I recommended him to go.  He understood.  He denies any chest pain or shortness of breath since the incident.  He does report that  he has been feeling fatigued.  When going over his medications he reports that he has been taking metoprolol  tartrate 25 mg daily as well as metoprolol  succinate 25 mg daily.  Unclear why patient was doing this.  This was changed at his last visit but he was unsure which 1 to take so he took both.  I think likely this is what is playing into his fatigue.  Could also be related to him recovering from his MI.  I did discard his metoprolol  tartrate as he should continue metoprolol  succinate.  I carefully went over all of his medications with him as well.  Plan: - Continue aspirin  81 mg daily - Continue Plavix  75 mg daily until November 2025 - Continue Crestor  40 mg daily -Continue metoprolol  succinate 25 mg daily  Essential hypertension Patient has a past medical history of hypertension.  Patient was recently started by his cardiologist on amlodipine -valsartan -HCTZ 5-160-25 mg daily.  Blood pressure today is 100/60.  Pulse of 55.  He does report feeling fatigued.  When inquiring about his medications.  He states he has been taking this medication as well as his old HCTZ medication.  He has also been taking metoprolol  succinate and tartrate at the same time.  I counseled him on which medications to take and which ones not to take.  I safely discarded his metoprolol  tartrate in his HCTZ.  Plan: - Given AKI will hold his amlodipine -valsartan -HCTZ for now, I will call him to resume - Obtain BMP - Discarded metoprolol  tartrate and hydrochlorothiazide  -Home health orders placed for medication management  AKI (acute kidney injury) [facility]) Patient had elevated creatinine 1.70.  This AKI likely was secondary to his MI.  Will hold his amlodipine -valsartan -HCTZ for now.  Will recheck BMP.  Pan: - Check BMP - Hold amlodipine -valsartan -HCTZ for now  Constipation Patient endorses being constipated.  Plan: - MiraLAX provided  Patient discussed with Dr. Sonia Durand, DO PGY-2 Internal Medicine  Resident

## 2023-07-22 ENCOUNTER — Ambulatory Visit: Payer: Self-pay | Admitting: Student

## 2023-07-22 ENCOUNTER — Telehealth: Payer: Self-pay | Admitting: Student

## 2023-07-22 ENCOUNTER — Telehealth: Payer: Self-pay | Admitting: *Deleted

## 2023-07-22 DIAGNOSIS — I10 Essential (primary) hypertension: Secondary | ICD-10-CM

## 2023-07-22 LAB — BMP8+ANION GAP
Anion Gap: 17 mmol/L (ref 10.0–18.0)
BUN/Creatinine Ratio: 13 (ref 10–24)
BUN: 38 mg/dL — ABNORMAL HIGH (ref 8–27)
CO2: 24 mmol/L (ref 20–29)
Calcium: 10.1 mg/dL (ref 8.6–10.2)
Chloride: 96 mmol/L (ref 96–106)
Creatinine, Ser: 2.83 mg/dL — ABNORMAL HIGH (ref 0.76–1.27)
Glucose: 110 mg/dL — ABNORMAL HIGH (ref 70–99)
Potassium: 3.6 mmol/L (ref 3.5–5.2)
Sodium: 137 mmol/L (ref 134–144)
eGFR: 23 mL/min/{1.73_m2} — ABNORMAL LOW (ref >59)

## 2023-07-22 MED ORDER — AMLODIPINE BESYLATE 10 MG PO TABS
10.0000 mg | ORAL_TABLET | Freq: Every day | ORAL | 11 refills | Status: DC
Start: 2023-07-22 — End: 2023-08-04

## 2023-07-22 NOTE — Telephone Encounter (Signed)
 Called pt to schedule a lab appt per Dr Lydia Sams - no answer; left message on pt's self-identified vm to call the office.

## 2023-07-22 NOTE — Progress Notes (Signed)
 Internal Medicine Clinic Attending  Case discussed with the resident at the time of the visit.  We reviewed the resident's history and exam and pertinent patient test results.  I agree with the assessment, diagnosis, and plan of care documented in the resident's note. We held his bp meds and took his duplicate meds before he left today as outlined in resident note.  Will follow with repeat bmp early next week after hydration and holding renally active meds.

## 2023-07-22 NOTE — Telephone Encounter (Signed)
 Called the patient to go over his lab results.  Kidney function has declined over the last month.  I have stopped his amlodipine -valsartan -HCTZ.  Will not start this back.  Will reinitiate amlodipine  at this time.  It will be easier to titrate medications if they are separate especially in his case.  I instructed the patient to stay hydrated.  He understands this.  On the phone today, he states he feels much better.  I anticipate his fatigue was secondary to him taking 2 times the beta-blocker that he need to take and also doubling his dose of HCTZ.  I do wonder if the double dose of HCTZ also affected his kidney function.  I will have him follow-up closely in the clinic next week for a lab only visit.  Orders are in.  I will also mail him a letter to make sure he has written instructions given that he does not have an active MyChart.

## 2023-07-22 NOTE — Telephone Encounter (Signed)
-----   Message from Jonelle Neri sent at 07/22/2023  9:55 AM EDT ----- Could we get this patient a lab visit only visit next Tuesday may 27th.

## 2023-07-23 ENCOUNTER — Telehealth: Payer: Self-pay | Admitting: *Deleted

## 2023-07-23 NOTE — Telephone Encounter (Signed)
 Pt has a lab appt 5/27.

## 2023-07-23 NOTE — Telephone Encounter (Unsigned)
 Copied from CRM [id number]. Topic: Clinical - Prescription Issue >> Jul 23, 2023  9:48 AM Brynn Caras wrote: Reason for CRM: The patient states [facility] will not have his newly prescribed medication - amLODipine  (NORVASC ) 10 MG tablet ready for pick-up until 4:00pm today. He wanted to make sure Dr.Patel is aware of this is since he was advised to start this medication to support his kidney function. I confirmed the patient's lab visit scheduled next Tuesday and his 2 week follow-up visit with Dr. Duncan Gibson on 08/04/2023. The patient is requesting a direct callback from Dr.Patel, if possible? Callback [phone]

## 2023-07-27 ENCOUNTER — Other Ambulatory Visit

## 2023-07-29 ENCOUNTER — Other Ambulatory Visit

## 2023-08-04 ENCOUNTER — Ambulatory Visit (INDEPENDENT_AMBULATORY_CARE_PROVIDER_SITE_OTHER): Admitting: Student

## 2023-08-04 ENCOUNTER — Encounter: Payer: Self-pay | Admitting: Student

## 2023-08-04 VITALS — BP 97/66 | HR 82 | Temp 98.3°F | Ht 73.0 in | Wt 197.2 lb

## 2023-08-04 DIAGNOSIS — I10 Essential (primary) hypertension: Secondary | ICD-10-CM | POA: Diagnosis not present

## 2023-08-04 DIAGNOSIS — R634 Abnormal weight loss: Secondary | ICD-10-CM

## 2023-08-04 DIAGNOSIS — N179 Acute kidney failure, unspecified: Secondary | ICD-10-CM

## 2023-08-04 DIAGNOSIS — I2510 Atherosclerotic heart disease of native coronary artery without angina pectoris: Secondary | ICD-10-CM | POA: Diagnosis not present

## 2023-08-04 DIAGNOSIS — F439 Reaction to severe stress, unspecified: Secondary | ICD-10-CM | POA: Diagnosis not present

## 2023-08-04 MED ORDER — MIRTAZAPINE 7.5 MG PO TABS: 7.5000 mg | ORAL_TABLET | Freq: Every day | ORAL | 1 refills | Status: DC

## 2023-08-04 NOTE — Progress Notes (Signed)
 CC: Blood pressure follow up  HPI:  Jeremy Hill is a 78 y.o. male living with a history stated below and presents today for the chief complaint stated above. Please see problem based assessment and plan for additional details.  Past Medical History:  Diagnosis Date   Anemia 2008   borderline low Hg/Hct = 12.6/39.6 (01/10/2007), no anemia panel available and patient had refused colonoscopy, last colonoscopy done was in 2005 and the results were normal, done by Dr. Tova Fresh   Depression    Hypertension    Neutropenia 2008   noted on cbc (01/10/2007) WBC = 3.9, also CBC done 08/2006 showed WBC = 3.0, unclear etiology; CXR done 08/2006 showed questionable right lung nodule and the follow up CT was recommended, there is no data in the EChart that it was done   Osteomyelitis (HCC) 12/2001   per MRI of the spine - GIVEN THE ABNORMALITY AT THE T8-9 LEVEL, INFECTION AT THESE LATTER REGIONS CANNOT BE COMPLETELY EXCLUDED AND  WILL NEED TO BE FOLLOWED CLOSELY. SCATTERED DEGENERATIVE CHANGES IN THE LOWER  THORACIC/LUMBAR SPINE  AT THE L4-5 SIGNIFICANT NEURAL FORAMINAL NARROWING (R>L).  DECREASED SIGNAL INTENSITY OF BONE MARROW. UNDERLYING ANEMIA/INFILTRATIVE PROCESS/ LYMPHOMA.    Osteomyelitis, chronic (HCC) 2004   persistant osteomyelitis per MRI 04/2002 and also progressive at the same level as in 2003 T8-9 level   Prostate cancer [facility])    STEMI (ST elevation myocardial infarction) (HCC) 06/11/2023    Current Outpatient Medications on File Prior to Visit  Medication Sig Dispense Refill   aspirin  EC 81 MG tablet Take 1 tablet (81 mg total) by mouth daily. Swallow whole. 90 tablet 3   clopidogrel  (PLAVIX ) 75 MG tablet Take 1 tablet (75 mg total) by mouth daily. 90 tablet 1   metoprolol  succinate (TOPROL -XL) 25 MG 24 hr tablet Take 1 tablet (25 mg total) by mouth daily. 90 tablet 3   nitroGLYCERIN  (NITROSTAT ) 0.4 MG SL tablet Place 1 tablet (0.4 mg total) under the tongue every 5 (five) minutes as  needed. 25 tablet 3   ondansetron  (ZOFRAN -ODT) 4 MG disintegrating tablet Take 1 tablet (4 mg total) by mouth every 8 (eight) hours as needed for nausea or vomiting. 20 tablet 0   polyethylene glycol (MIRALAX ) 17 g packet Take 17 g by mouth daily. 30 packet 1   rosuvastatin  (CRESTOR ) 40 MG tablet Take 1 tablet (40 mg total) by mouth daily. 90 tablet 3   No current facility-administered medications on file prior to visit.    Family History  Problem Relation Age of Onset   Heart disease Mother    Heart disease Father    Diabetes Sister    Breast cancer Sister    Suicidality Brother    Prostate cancer Neg Hx    Colon cancer Neg Hx    Pancreatic cancer Neg Hx     Social History   Socioeconomic History   Marital status: Divorced    Spouse name: Not on file   Number of children: 3   Years of education: Not on file   Highest education level: Not on file  Occupational History    Comment: retired  Tobacco Use   Smoking status: Never   Smokeless tobacco: Never  Vaping Use   Vaping status: Never Used  Substance and Sexual Activity   Alcohol use: No    Alcohol/week: 0.0 standard drinks of alcohol   Drug use: No   Sexual activity: Not Currently  Other Topics Concern  Not on file  Social History Narrative   Recently divorced.    Social Drivers of Corporate investment banker Strain: Low Risk  (07/08/2023)   Overall Financial Resource Strain (CARDIA)    Difficulty of Paying Living Expenses: Not hard at all  Food Insecurity: No Food Insecurity (07/08/2023)   Hunger Vital Sign    Worried About Running Out of Food in the Last Year: Never true    Ran Out of Food in the Last Year: Never true  Recent Concern: Food Insecurity - Food Insecurity Present (06/11/2023)   Hunger Vital Sign    Worried About Running Out of Food in the Last Year: Never true    Ran Out of Food in the Last Year: Sometimes true  Transportation Needs: No Transportation Needs (07/08/2023)   PRAPARE - Therapist, art (Medical): No    Lack of Transportation (Non-Medical): No  Physical Activity: Inactive (07/08/2023)   Exercise Vital Sign    Days of Exercise per Week: 0 days    Minutes of Exercise per Session: 0 min  Stress: No Stress Concern Present (07/08/2023)   [employer] of Occupational Health - Occupational Stress Questionnaire    Feeling of Stress : Not at all  Social Connections: Moderately Integrated (07/08/2023)   Social Connection and Isolation Panel [NHANES]    Frequency of Communication with Friends and Family: More than three times a week    Frequency of Social Gatherings with Friends and Family: More than three times a week    Attends Religious Services: More than 4 times per year    Active Member of [employer] or Organizations: Yes    Attends Engineer, structural: More than 4 times per year    Marital Status: Divorced  Intimate Partner Violence: Not At Risk (07/08/2023)   Humiliation, Afraid, Rape, and Kick questionnaire    Fear of Current or Ex-Partner: No    Emotionally Abused: No    Physically Abused: No    Sexually Abused: No   Review of Systems: ROS negative except for what is noted on the assessment and plan.  Vitals:   08/04/23 1527 08/04/23 1537 08/04/23 1625  BP: (!) 83/49 (!) 81/54 97/66  Pulse: 84 88 82  Temp: 98.3 F (36.8 C)    TempSrc: Oral    SpO2: 100%    Weight: 197 lb 3.2 oz (89.4 kg)    Height: 6\' 1"  (1.854 m)     Physical Exam: Older man, sitting in chair in no acute distress Heart rate and rhythm regular, no murmurs, appears dry on exam Lungs clear to auscultation bilaterally, normal respiratory rate and effort Skin appears dry No new neurological deficits Abdomen soft, nontender, nondistended  Assessment & Plan:   Patient discussed with Dr. Broadus Canes  CAD (coronary artery disease) Has a significant coronary artery disease history including a STEMI in April 2025, which was treated with medical therapy.  He has been  taking Crestor  40 mg daily, metoprolol  succinate 25 mg daily, Plavix  75 mg daily, and aspirin  81 mg daily.  He denies any current chest pain, chest tightness, palpitations.  Heart rate and rhythm sound regular on my exam today.  Will continue with his current medications as prescribed.  His next follow-up appoint with cardiology is in August.  AKI (acute kidney injury) [facility]) Patient had creatinine of 2.8 at his last visit, up from his baseline around 1.2.  His creatinine has risen significantly since his STEMI in April 2025.  Given his acute kidney injury, his ARB and hydrochlorothiazide  were held at his last visit. He denies any urinary symptoms, including changes in urinary frequency. I will recheck a BMP today.  Essential hypertension He presented hypotensive today with a blood pressure initially of 83/49 with a MAP of 60.  He reported some lightheadedness and dizziness occasionally at home.  I suspect this is secondary to poor oral intake, as he says he has not been eating as much as he used to since his MI in April.  He says he sometimes eats 1 meal a day, but sometimes does not eat any meals in a day.  He reports not having an appetite and food making him nauseous occasionally when he smells it.  He has had a significant weight loss since his MI in April, down almost 25 pounds.   His blood pressure medications were changed at his last visit due to hypotension.  He is still on amlodipine , which I have stopped in the setting of his hypotension.  We gave him several cups of water  in clinic and his blood pressure improved to 97/66 with a MAP of 76.  I have encouraged him to continue to try to eat and drink adequate fluids on a daily basis.  I have started mirtazapine for appetite stimulation.  I have given him ED return precautions and will see him back in 1 week for reevaluation of his blood pressure and further exploration of his weight loss and lack of appetite.  Stress at home He reports  significant stress at home, with people making comments that he "belongs in an old person home."  He mentioned this several times during his visit, so it is clear that he has really been taking a toll on him.  I spoke with him about getting connected with Creasie Doctor, our behavioral health counselor, to talk through his feelings and to get connected to further resources.  He was agreeable to this.    Dorthy Gavia, MD [facility] Internal Medicine, PGY-1 Phone: [phone] Date 08/04/2023 Time 5:15 PM

## 2023-08-04 NOTE — Assessment & Plan Note (Signed)
 He reports significant stress at home, with people making comments that he "belongs in an old person home."  He mentioned this several times during his visit, so it is clear that he has really been taking a toll on him.  I spoke with him about getting connected with Creasie Doctor, our behavioral health counselor, to talk through his feelings and to get connected to further resources.  He was agreeable to this.

## 2023-08-04 NOTE — Patient Instructions (Signed)
 Thank you, [name] for allowing us  to provide your care today. Today we discussed your blood pressure, kidney function, and poor appetite.  Your blood pressure was quite low today, so I stopped one of your medications called amlodipine .  Please do your best to try to eat more and drink plenty of fluids throughout the day.  I have started a medication called mirtazapine to help you with your appetite. Please take this once nightly.  You may keep taking your other medications as prescribed.  If you continue to feel extremely lightheaded, like you are going to pass out frequently, please go to the emergency department for further evaluation.   I have ordered the following labs for you:  Lab Orders         BMP8+Anion Gap         TSH      I will call you as soon as I have the results.   Referrals ordered today:   Referral Orders         Ambulatory referral to Integrated Behavioral Health       I have ordered the following medication/changed the following medications:   Stop the following medications: Medications Discontinued During This Encounter  Medication Reason   amLODipine  (NORVASC ) 10 MG tablet Discontinued by provider   HYDROcodone -acetaminophen  (NORCO) 10-325 MG tablet Patient has not taken in last 30 days    Start the following medications: Meds ordered this encounter  Medications   mirtazapine (REMERON) 7.5 MG tablet    Sig: Take 1 tablet (7.5 mg total) by mouth at bedtime.    Dispense:  90 tablet    Refill:  1     Follow up: 1 week  We look forward to seeing you next time. Please call our clinic at [phone] if you have any questions or concerns. The best time to call is Monday-Friday from 9am-4pm, but there is someone available 24/7. If after hours or the weekend, call the main hospital number and ask for the Internal Medicine Resident On-Call. If you need medication refills, please notify your pharmacy one week in advance and they will send us  a request.    Thank you for trusting me with your care. Wishing you the best!   Jeremy Gavia, MD [facility]

## 2023-08-04 NOTE — Assessment & Plan Note (Addendum)
 He presented hypotensive today with a blood pressure initially of 83/49 with a MAP of 60.  He reported some lightheadedness and dizziness occasionally at home.  I suspect this is secondary to poor oral intake, as he says he has not been eating as much as he used to since his MI in April.  He says he sometimes eats 1 meal a day, but sometimes does not eat any meals in a day.  He reports not having an appetite and food making him nauseous occasionally when he smells it.  He has had a significant weight loss since his MI in April, down almost 25 pounds.   His blood pressure medications were changed at his last visit due to hypotension.  He is still on amlodipine , which I have stopped in the setting of his hypotension.  We gave him several cups of water  in clinic and his blood pressure improved to 97/66 with a MAP of 76.  I have encouraged him to continue to try to eat and drink adequate fluids on a daily basis.  I have started mirtazapine for appetite stimulation.  I have given him ED return precautions and will see him back in 1 week for reevaluation of his blood pressure and further exploration of his weight loss and lack of appetite.

## 2023-08-04 NOTE — Assessment & Plan Note (Addendum)
 Patient had creatinine of 2.8 at his last visit, up from his baseline around 1.2.  His creatinine has risen significantly since his STEMI in April 2025.  Given his acute kidney injury, his ARB and hydrochlorothiazide  were held at his last visit. He denies any urinary symptoms, including changes in urinary frequency. I will recheck a BMP today.

## 2023-08-04 NOTE — Assessment & Plan Note (Addendum)
 Has a significant coronary artery disease history including a STEMI in April 2025, which was treated with medical therapy.  He has been taking Crestor  40 mg daily, metoprolol  succinate 25 mg daily, Plavix  75 mg daily, and aspirin  81 mg daily.  He denies any current chest pain, chest tightness, palpitations.  Heart rate and rhythm sound regular on my exam today.  Will continue with his current medications as prescribed.  His next follow-up appoint with cardiology is in August.

## 2023-08-05 ENCOUNTER — Emergency Department (HOSPITAL_COMMUNITY)
Admission: EM | Admit: 2023-08-05 | Discharge: 2023-08-06 | Discharge status: Against Medical Advice | Attending: Emergency Medicine | Admitting: Emergency Medicine

## 2023-08-05 ENCOUNTER — Other Ambulatory Visit: Payer: Self-pay

## 2023-08-05 ENCOUNTER — Ambulatory Visit: Payer: Self-pay | Admitting: Student

## 2023-08-05 DIAGNOSIS — R8290 Unspecified abnormal findings in urine: Secondary | ICD-10-CM | POA: Insufficient documentation

## 2023-08-05 DIAGNOSIS — Z5321 Procedure and treatment not carried out due to patient leaving prior to being seen by health care provider: Secondary | ICD-10-CM | POA: Insufficient documentation

## 2023-08-05 LAB — URINALYSIS, ROUTINE W REFLEX MICROSCOPIC
Bilirubin Urine: NEGATIVE
Glucose, UA: 50 mg/dL — AB
Ketones, ur: NEGATIVE mg/dL
Leukocytes,Ua: NEGATIVE
Nitrite: NEGATIVE
Protein, ur: 100 mg/dL — AB
Specific Gravity, Urine: 1.024 (ref 1.005–1.030)
pH: 5 (ref 5.0–8.0)

## 2023-08-05 LAB — BMP8+ANION GAP
Anion Gap: 14 mmol/L (ref 10.0–18.0)
BUN/Creatinine Ratio: 10 (ref 10–24)
BUN: 33 mg/dL — ABNORMAL HIGH (ref 8–27)
CO2: 23 mmol/L (ref 20–29)
Calcium: 10.1 mg/dL (ref 8.6–10.2)
Chloride: 98 mmol/L (ref 96–106)
Creatinine, Ser: 3.4 mg/dL — ABNORMAL HIGH (ref 0.76–1.27)
Glucose: 127 mg/dL — ABNORMAL HIGH (ref 70–99)
Potassium: 3.5 mmol/L (ref 3.5–5.2)
Sodium: 135 mmol/L (ref 134–144)
eGFR: 18 mL/min/{1.73_m2} — ABNORMAL LOW (ref >59)

## 2023-08-05 LAB — TSH: TSH: 1.55 u[IU]/mL (ref 0.450–4.500)

## 2023-08-05 NOTE — ED Triage Notes (Signed)
 Pt. Stated, I got a call this morning saying to come to ER for an abnormal urine . Pt has no symptoms

## 2023-08-05 NOTE — ED Notes (Signed)
 Pt urine as been sent to lab. Lab called

## 2023-08-05 NOTE — ED Notes (Signed)
 Pt left AMA due to long ED wait times. States he will return tomorrow.

## 2023-08-09 NOTE — Progress Notes (Signed)
 Internal Medicine Clinic Attending  Case discussed with the resident at the time of the visit.  We reviewed the resident's history and exam and pertinent patient test results.  I agree with the assessment, diagnosis, and plan of care documented in the resident's note.

## 2023-08-10 ENCOUNTER — Other Ambulatory Visit: Payer: Self-pay | Admitting: Student

## 2023-08-10 DIAGNOSIS — I2510 Atherosclerotic heart disease of native coronary artery without angina pectoris: Secondary | ICD-10-CM

## 2023-08-11 ENCOUNTER — Encounter: Payer: Self-pay | Admitting: Student

## 2023-08-11 ENCOUNTER — Ambulatory Visit: Admitting: Student

## 2023-08-11 VITALS — BP 118/73 | HR 83 | Temp 98.0°F | Ht 73.0 in | Wt 201.8 lb

## 2023-08-11 DIAGNOSIS — N179 Acute kidney failure, unspecified: Secondary | ICD-10-CM

## 2023-08-11 DIAGNOSIS — I10 Essential (primary) hypertension: Secondary | ICD-10-CM | POA: Diagnosis not present

## 2023-08-11 NOTE — Patient Instructions (Signed)
 Thank you, [name] for allowing us  to provide your care today. Today we discussed your acute kidney injury and blood pressure.   We are rechecking your kidney function labs today. If they are worse, I will call you tomorrow about getting a hospital bed. If they are better, we will have another follow up visit next week to recheck them.  I am glad you are feeling better. Keep drinking plenty of fluids and eating as you are able to.    I have ordered the following labs for you:   Lab Orders         Basic metabolic panel with GFR      I will call you as soon as I have the results.   Follow up: 1 week  We look forward to seeing you next time. Please call our clinic at [phone] if you have any questions or concerns. The best time to call is Monday-Friday from 9am-4pm, but there is someone available 24/7. If after hours or the weekend, call the main hospital number and ask for the Internal Medicine Resident On-Call. If you need medication refills, please notify your pharmacy one week in advance and they will send us  a request.   Thank you for trusting me with your care. Wishing you the best!   Dorthy Gavia, MD [facility] Center

## 2023-08-11 NOTE — Progress Notes (Signed)
 CC: F/u hypotension, AKI  HPI:  [name] is a 78 y.o. male living with a history stated below and presents today for the chief complaint stated above. Please see problem based assessment and plan for additional details.  Past Medical History:  Diagnosis Date   Anemia 2008   borderline low Hg/Hct = 12.6/39.6 (01/10/2007), no anemia panel available and patient had refused colonoscopy, last colonoscopy done was in 2005 and the results were normal, done by Dr. Tova Fresh   Depression    Hypertension    Neutropenia 2008   noted on cbc (01/10/2007) WBC = 3.9, also CBC done 08/2006 showed WBC = 3.0, unclear etiology; CXR done 08/2006 showed questionable right lung nodule and the follow up CT was recommended, there is no data in the EChart that it was done   Osteomyelitis (HCC) 12/2001   per MRI of the spine - GIVEN THE ABNORMALITY AT THE T8-9 LEVEL, INFECTION AT THESE LATTER REGIONS CANNOT BE COMPLETELY EXCLUDED AND  WILL NEED TO BE FOLLOWED CLOSELY. SCATTERED DEGENERATIVE CHANGES IN THE LOWER  THORACIC/LUMBAR SPINE  AT THE L4-5 SIGNIFICANT NEURAL FORAMINAL NARROWING (R>L).  DECREASED SIGNAL INTENSITY OF BONE MARROW. UNDERLYING ANEMIA/INFILTRATIVE PROCESS/ LYMPHOMA.    Osteomyelitis, chronic (HCC) 2004   persistant osteomyelitis per MRI 04/2002 and also progressive at the same level as in 2003 T8-9 level   Prostate cancer [facility])    STEMI (ST elevation myocardial infarction) (HCC) 06/11/2023    Current Outpatient Medications on File Prior to Visit  Medication Sig Dispense Refill   aspirin  EC 81 MG tablet Take 1 tablet (81 mg total) by mouth daily. Swallow whole. 90 tablet 3   clopidogrel  (PLAVIX ) 75 MG tablet Take 1 tablet (75 mg total) by mouth daily. 90 tablet 1   metoprolol  succinate (TOPROL -XL) 25 MG 24 hr tablet Take 1 tablet (25 mg total) by mouth daily. 90 tablet 3   mirtazapine  (REMERON ) 7.5 MG tablet Take 1 tablet (7.5 mg total) by mouth at bedtime. 90 tablet 1   nitroGLYCERIN   (NITROSTAT ) 0.4 MG SL tablet Place 1 tablet (0.4 mg total) under the tongue every 5 (five) minutes as needed. 25 tablet 3   ondansetron  (ZOFRAN -ODT) 4 MG disintegrating tablet Take 1 tablet (4 mg total) by mouth every 8 (eight) hours as needed for nausea or vomiting. 20 tablet 0   polyethylene glycol (MIRALAX ) 17 g packet Take 17 g by mouth daily. 30 packet 1   rosuvastatin  (CRESTOR ) 40 MG tablet Take 1 tablet (40 mg total) by mouth daily. 90 tablet 3   No current facility-administered medications on file prior to visit.    Family History  Problem Relation Age of Onset   Heart disease Mother    Heart disease Father    Diabetes Sister    Breast cancer Sister    Suicidality Brother    Prostate cancer Neg Hx    Colon cancer Neg Hx    Pancreatic cancer Neg Hx     Social History   Socioeconomic History   Marital status: Divorced    Spouse name: Not on file   Number of children: 3   Years of education: Not on file   Highest education level: Not on file  Occupational History    Comment: retired  Tobacco Use   Smoking status: Never   Smokeless tobacco: Never  Vaping Use   Vaping status: Never Used  Substance and Sexual Activity   Alcohol use: No    Alcohol/week: 0.0 standard drinks  of alcohol   Drug use: No   Sexual activity: Not Currently  Other Topics Concern   Not on file  Social History Narrative   Recently divorced.    Social Drivers of Corporate investment banker Strain: Low Risk  (07/08/2023)   Overall Financial Resource Strain (CARDIA)    Difficulty of Paying Living Expenses: Not hard at all  Food Insecurity: No Food Insecurity (07/08/2023)   Hunger Vital Sign    Worried About Running Out of Food in the Last Year: Never true    Ran Out of Food in the Last Year: Never true  Recent Concern: Food Insecurity - Food Insecurity Present (06/11/2023)   Hunger Vital Sign    Worried About Running Out of Food in the Last Year: Never true    Ran Out of Food in the Last Year:  Sometimes true  Transportation Needs: No Transportation Needs (07/08/2023)   PRAPARE - Administrator, Civil Service (Medical): No    Lack of Transportation (Non-Medical): No  Physical Activity: Inactive (07/08/2023)   Exercise Vital Sign    Days of Exercise per Week: 0 days    Minutes of Exercise per Session: 0 min  Stress: No Stress Concern Present (07/08/2023)   [employer] of Occupational Health - Occupational Stress Questionnaire    Feeling of Stress : Not at all  Social Connections: Moderately Integrated (07/08/2023)   Social Connection and Isolation Panel [NHANES]    Frequency of Communication with Friends and Family: More than three times a week    Frequency of Social Gatherings with Friends and Family: More than three times a week    Attends Religious Services: More than 4 times per year    Active Member of [employer] or Organizations: Yes    Attends Engineer, structural: More than 4 times per year    Marital Status: Divorced  Intimate Partner Violence: Not At Risk (07/08/2023)   Humiliation, Afraid, Rape, and Kick questionnaire    Fear of Current or Ex-Partner: No    Emotionally Abused: No    Physically Abused: No    Sexually Abused: No   Review of Systems: ROS negative except for what is noted on the assessment and plan.  Vitals:   08/11/23 1612  BP: 118/73  Pulse: 83  Temp: 98 F (36.7 C)  TempSrc: Oral  SpO2: 96%  Weight: 201 lb 12.8 oz (91.5 kg)  Height: 6' 1 (1.854 m)   Physical Exam: Constitutional: well-appearing man sitting in chair, in no acute distress Appears euvolemic, normal skin turgor, mucous membranes moist Heart rate and rhythm regular, no murmurs Lungs clear to auscultation bilaterally Normal muscular bulk and tone No focal neurological deficits  Assessment & Plan:   Patient discussed with Dr. Broadus Canes  AKI (acute kidney injury) [facility]) He has had a rising creatinine for the past several weeks, rising up to 3.4 from her  baseline of around 1.2. After his last visit, I recommended he go to the emergency department, however he left the ED before being seen. I tried to call him several times in the interim but reached his voicemail.   He presents to the clinic today feeling better. He denies any urinary symptoms and says he urinates 3-4 times daily with normal volume and color. He denies any lightheadedness and dizziness. He has been drinking and eating more after starting mirtazapine , which he feels is really helping him. I encouraged him to continue to drink plenty of fluids. On exam,  he appears euvolemic. He has normal skin turgor and mucous membranes are moist. He is able to ambulate more steadily than last visit. He denies any other new symptoms.   We will recheck his kidney function today.  I have let him know that if his kidney function is the same or  worse as last week, I will likely call him tomorrow to have him direct admitted to the hospital for further workup and treatment of this. If it is better, we will plan for weekly follow up visits to trend it until he returns to baseline.   Essential hypertension BP Readings from Last 3 Encounters:  08/11/23 118/73  08/05/23 131/79  08/04/23 97/66   Blood pressure is within normal limits today.  He says but he has been trying to eat and drink more since our last visit. He is taking metoprolol  succinate 25 mg daily, which we will continue.   Dorthy Gavia, MD [facility] Internal Medicine, PGY-1 Phone: [phone] Date 08/11/2023 Time 4:46 PM

## 2023-08-11 NOTE — Assessment & Plan Note (Addendum)
 He has had a rising creatinine for the past several weeks, rising up to 3.4 from her baseline of around 1.2. After his last visit, I recommended he go to the emergency department, however he left the ED before being seen. I tried to call him several times in the interim but reached his voicemail.   He presents to the clinic today feeling better. He denies any urinary symptoms and says he urinates 3-4 times daily with normal volume and color. He denies any lightheadedness and dizziness. He has been drinking and eating more after starting mirtazapine , which he feels is really helping him. I encouraged him to continue to drink plenty of fluids. On exam, he appears euvolemic. He has normal skin turgor and mucous membranes are moist. He is able to ambulate more steadily than last visit. He denies any other new symptoms.   We will recheck his kidney function today.  I have let him know that if his kidney function is the same or  worse as last week, I will likely call him tomorrow to have him direct admitted to the hospital for further workup and treatment of this. If it is better, we will plan for weekly follow up visits to trend it until he returns to baseline.

## 2023-08-11 NOTE — Assessment & Plan Note (Addendum)
 BP Readings from Last 3 Encounters:  08/11/23 118/73  08/05/23 131/79  08/04/23 97/66   Blood pressure is within normal limits today.  He says but he has been trying to eat and drink more since our last visit. He is taking metoprolol  succinate 25 mg daily, which we will continue.

## 2023-08-12 ENCOUNTER — Other Ambulatory Visit: Payer: Self-pay | Admitting: Student

## 2023-08-12 ENCOUNTER — Ambulatory Visit: Payer: Self-pay | Admitting: Student

## 2023-08-12 DIAGNOSIS — N179 Acute kidney failure, unspecified: Secondary | ICD-10-CM

## 2023-08-12 LAB — BASIC METABOLIC PANEL WITH GFR
BUN/Creatinine Ratio: 16 (ref 10–24)
BUN: 39 mg/dL — ABNORMAL HIGH (ref 8–27)
CO2: 22 mmol/L (ref 20–29)
Calcium: 9.6 mg/dL (ref 8.6–10.2)
Chloride: 105 mmol/L (ref 96–106)
Creatinine, Ser: 2.51 mg/dL — ABNORMAL HIGH (ref 0.76–1.27)
Glucose: 141 mg/dL — ABNORMAL HIGH (ref 70–99)
Potassium: 3.8 mmol/L (ref 3.5–5.2)
Sodium: 143 mmol/L (ref 134–144)
eGFR: 26 mL/min/{1.73_m2} — ABNORMAL LOW (ref >59)

## 2023-08-18 ENCOUNTER — Other Ambulatory Visit

## 2023-08-18 DIAGNOSIS — N179 Acute kidney failure, unspecified: Secondary | ICD-10-CM | POA: Diagnosis not present

## 2023-08-19 ENCOUNTER — Telehealth: Payer: Self-pay | Admitting: *Deleted

## 2023-08-19 LAB — BMP8+ANION GAP
Anion Gap: 15 mmol/L (ref 10.0–18.0)
BUN/Creatinine Ratio: 14 (ref 10–24)
BUN: 28 mg/dL — ABNORMAL HIGH (ref 8–27)
CO2: 21 mmol/L (ref 20–29)
Calcium: 9.6 mg/dL (ref 8.6–10.2)
Chloride: 106 mmol/L (ref 96–106)
Creatinine, Ser: 2.07 mg/dL — ABNORMAL HIGH (ref 0.76–1.27)
Glucose: 133 mg/dL — ABNORMAL HIGH (ref 70–99)
Potassium: 4.1 mmol/L (ref 3.5–5.2)
Sodium: 142 mmol/L (ref 134–144)
eGFR: 33 mL/min/{1.73_m2} — ABNORMAL LOW (ref >59)

## 2023-08-19 MED ORDER — HYDROCODONE-ACETAMINOPHEN 10-325 MG PO TABS: 1.0000 | ORAL_TABLET | Freq: Four times a day (QID) | ORAL | 0 refills | Status: DC | PRN

## 2023-08-19 NOTE — Telephone Encounter (Signed)
 Call from patient requesting a refill on his Hydrocodone .  States was told to call the Clinics for he refill.  States usually gets the refill done on the 20th of the month.

## 2023-08-19 NOTE — Progress Notes (Signed)
 Internal Medicine Clinic Attending  Case discussed with the resident at the time of the visit.  We reviewed the resident's history and exam and pertinent patient test results.  I agree with the assessment, diagnosis, and plan of care documented in the resident's note.

## 2023-08-21 ENCOUNTER — Other Ambulatory Visit: Payer: Self-pay | Admitting: Student

## 2023-08-21 ENCOUNTER — Ambulatory Visit: Payer: Self-pay | Admitting: Student

## 2023-08-21 DIAGNOSIS — N179 Acute kidney failure, unspecified: Secondary | ICD-10-CM

## 2023-09-02 ENCOUNTER — Other Ambulatory Visit

## 2023-09-02 DIAGNOSIS — N179 Acute kidney failure, unspecified: Secondary | ICD-10-CM

## 2023-09-03 LAB — BMP8+ANION GAP
Anion Gap: 12 mmol/L (ref 10.0–18.0)
BUN/Creatinine Ratio: 12 (ref 10–24)
BUN: 18 mg/dL (ref 8–27)
CO2: 23 mmol/L (ref 20–29)
Calcium: 9 mg/dL (ref 8.6–10.2)
Chloride: 106 mmol/L (ref 96–106)
Creatinine, Ser: 1.51 mg/dL — ABNORMAL HIGH (ref 0.76–1.27)
Glucose: 108 mg/dL — ABNORMAL HIGH (ref 70–99)
Potassium: 4 mmol/L (ref 3.5–5.2)
Sodium: 141 mmol/L (ref 134–144)
eGFR: 48 mL/min/1.73 — ABNORMAL LOW (ref >59)

## 2023-09-14 ENCOUNTER — Other Ambulatory Visit: Payer: Self-pay

## 2023-09-14 ENCOUNTER — Encounter: Payer: Self-pay | Admitting: *Deleted

## 2023-09-14 MED ORDER — HYDROCODONE-ACETAMINOPHEN 10-325 MG PO TABS: 1.0000 | ORAL_TABLET | Freq: Four times a day (QID) | ORAL | 0 refills | Status: DC | PRN

## 2023-09-14 NOTE — Telephone Encounter (Signed)
 08/19/23 90 tabs/0 RF (23 day)

## 2023-09-14 NOTE — Telephone Encounter (Signed)
 Copied from CRM [id number]. Topic: Clinical - Medication Refill >> Sep 14, 2023  8:44 AM Diannia H wrote: Medication: HYDROcodone -acetaminophen  (NORCO) 10-325 MG tablet  Has the patient contacted their pharmacy? Yes (Agent: If no, request that the patient contact the pharmacy for the refill. If patient does not wish to contact the pharmacy document the reason why and proceed with request.) (Agent: If yes, when and what did the pharmacy advise?)  This is the patient's preferred pharmacy:  [facility] [phone] - [place], [place] - [address] AT [facility] [address] [place] [address] Phone: [phone] Fax: [phone]  Is this the correct pharmacy for this prescription? Yes If no, delete pharmacy and type the correct one.   Has the prescription been filled recently? Yes  Is the patient out of the medication? No  Has the patient been seen for an appointment in the last year OR does the patient have an upcoming appointment? Yes  Can we respond through MyChart? Yes  Agent: Please be advised that Rx refills may take up to 3 business days. We ask that you follow-up with your pharmacy.

## 2023-09-20 ENCOUNTER — Ambulatory Visit: Payer: Self-pay | Admitting: Licensed Clinical Social Worker

## 2023-09-20 DIAGNOSIS — F4389 Other reactions to severe stress: Secondary | ICD-10-CM | POA: Diagnosis not present

## 2023-09-20 DIAGNOSIS — F439 Reaction to severe stress, unspecified: Secondary | ICD-10-CM

## 2023-09-20 NOTE — BH Specialist Note (Signed)
 Integrated Behavioral Health Initial In-Person Visit  MRN: [id number] Name: Jeremy Hill  Number of Integrated Behavioral Health Clinician visits: No data recorded Session Start time: 1330    Session End time: 1400  Total time in minutes: 30    Types of Service: Introduction only  Interpretor:No. Interpretor Name and Language: N/A   Subjective: Jeremy Hill is a 78 y.o. male   Patient was referred by PCP for Stress. Patient reports the following symptoms/concerns: The Licensed Clinical Social Customer service manager (LCSW-A),  initiated a session with patient. The [facility] introduced themselves, explained her role, and provided contact information to the patient. Confidentiality and mandated reporting were discussed, and the patient denied any suicidal ideations or intent to harm others. The Integrated Behavioral Health (IBH) approach was reviewed, and a PHQ-9 assessment was completed. Client reported feeling better on today and no longer as stressed. Client agreed to a follow up appointment in November. Nothing additional discussed during session..   Duration of problem: Less than 6 months; Severity of problem: mild  Objective: Mood: Calm and Affect: Appropriate Risk of harm to self or others: No plan to harm self or others  Life Context: Family and Social: Not discussed during session School/Work: Not discussed during session Self-Care: Not discussed during session Life Changes: Patient recently had a heart attack.  Patient and/or Family's Strengths/Protective Factors: Sense of purpose  Goals Addressed: Patient will: Reduce symptoms of: stress   Progress towards Goals: Ongoing  Interventions: Interventions utilized: Supportive Counseling  Standardized Assessments completed: Not Needed     Patient and/or Family Response: Patient agreed to a follow up in November.   Clinical Assessment/Diagnosis  Stress at home   Assessment: Patient currently experiencing Stress.    Patient may benefit from Counseling services.  Plan: Follow up with behavioral health clinician on : Client denied wanting regular follow up, but agreed to a follow up in November  Renda Pontes, MSW, LCSW-A She/Her Behavioral Health Clinician [facility]  Internal Medicine Center

## 2023-10-14 ENCOUNTER — Other Ambulatory Visit: Payer: Self-pay

## 2023-10-14 NOTE — Telephone Encounter (Signed)
 Primary Information  Source  Joshua Banda D (Patient)   Subject  Joshua Banda D (Patient)   Topic  Clinical - Medication Refill    Communication  Medication: HYDROcodone -acetaminophen  (NORCO) 10-325 MG tablet        Has the patient contacted their pharmacy? No    Patient knows to call in for this particular medication        This is the patient's preferred pharmacy:    [facility] [phone] - [place], [place] - [address] AT [facility] [address]    [address]    [place] [address]    Phone: [phone] Fax: [phone]        Is this the correct pharmacy for this prescription? Yes    If no, delete pharmacy and type the correct one.        Has the prescription been filled recently? Yes        Is the patient out of the medication? No: 6 pills left        Has the patient been seen for an appointment in the last year OR does the patient have an upcoming appointment? Yes        Can we respond through MyChart? No        Agent: Please be advised that Rx refills may take up to 3 business days. We ask that you follow-up with your pharmacy.

## 2023-10-14 NOTE — Telephone Encounter (Signed)
 Last rx written - 09/14/23. Last OV - 08/11/23 with Dr Gregary. Next OV - has not been scheduled. TOX - 06/04/2020.

## 2023-10-15 MED ORDER — HYDROCODONE-ACETAMINOPHEN 10-325 MG PO TABS: 1.0000 | ORAL_TABLET | Freq: Four times a day (QID) | ORAL | 0 refills | Status: AC | PRN

## 2023-10-18 NOTE — Progress Notes (Deleted)
 Cardiology Office Note    Patient Name: Jeremy Hill Date of Encounter: 10/18/2023  Primary Care Provider:  Shawn Sick, MD Primary Cardiologist:  Jeremy MARLA Red, MD Primary Electrophysiologist: None   Past Medical History    Past Medical History:  Diagnosis Date   Anemia 2008   borderline low Hg/Hct = 12.6/39.6 (01/10/2007), no anemia panel available and patient had refused colonoscopy, last colonoscopy done was in 2005 and the results were normal, done by Dr. Kristie   Depression    Hypertension    Neutropenia 2008   noted on cbc (01/10/2007) WBC = 3.9, also CBC done 08/2006 showed WBC = 3.0, unclear etiology; CXR done 08/2006 showed questionable right lung nodule and the follow up CT was recommended, there is no data in the EChart that it was done   Osteomyelitis (HCC) 12/2001   per MRI of the spine - GIVEN THE ABNORMALITY AT THE T8-9 LEVEL, INFECTION AT THESE LATTER REGIONS CANNOT BE COMPLETELY EXCLUDED AND  WILL NEED TO BE FOLLOWED CLOSELY. SCATTERED DEGENERATIVE CHANGES IN THE LOWER  THORACIC/LUMBAR SPINE  AT THE L4-5 SIGNIFICANT NEURAL FORAMINAL NARROWING (R>L).  DECREASED SIGNAL INTENSITY OF BONE MARROW. UNDERLYING ANEMIA/INFILTRATIVE PROCESS/ LYMPHOMA.    Osteomyelitis, chronic (HCC) 2004   persistant osteomyelitis per MRI 04/2002 and also progressive at the same level as in 2003 T8-9 level   Prostate cancer Jeremy Hill)    STEMI (ST elevation myocardial infarction) (HCC) 06/11/2023    History of Present Illness  Jeremy Hill is a 78 y.o. male with a PMH of CAD s/p posterior STEMI 06/2023, HTN, HLD, prostate CA central retinal vein occlusion 2017 who presents today for 49-month follow-up.  Jeremy Hill was last seen on 07/02/2023 following his posterior STEMI. Left heart cath was completed showing tortuous but patent coronary arteries with small subbranch of obtuse marginal with recommendation of medical therapy and aggressive BP control.  During his follow-up visit he denied any chest  pain but was experiencing some balance issues using a walker and cane.  He was referred to cardiac rehab and started on Exforge  for management of BP.   Patient denies chest pain, palpitations, dyspnea, PND, orthopnea, nausea, vomiting, dizziness, syncope, edema, weight gain, or early satiety.   Discussed the use of AI scribe software for clinical note transcription with the patient, who gave verbal consent to proceed.  History of Present Illness    ***Notes:   Review of Systems  Please see the history of present illness.    All other systems reviewed and are otherwise negative except as noted above.  Physical Exam    Wt Readings from Last 3 Encounters:  08/11/23 201 lb 12.8 oz (91.5 kg)  08/04/23 197 lb 3.2 oz (89.4 kg)  07/21/23 201 lb 12.8 oz (91.5 kg)   CD:Uyzmz were no vitals filed for this visit.,There is no height or weight on file to calculate BMI. GEN: Well nourished, well developed in no acute distress Neck: No JVD; No carotid bruits Pulmonary: Clear to auscultation without rales, wheezing or rhonchi  Cardiovascular: Normal rate. Regular rhythm. Normal S1. Normal S2.   Murmurs: There is no murmur.  ABDOMEN: Soft, non-tender, non-distended EXTREMITIES:  No edema; No deformity   EKG/LABS/ Recent Cardiac Studies   ECG personally reviewed by me today - ***  Risk Assessment/Calculations:   {Does this patient have ATRIAL FIBRILLATION?:[phone]}      Lab Results  Component Value Date   WBC 8.7 06/14/2023   HGB 14.0 06/14/2023   HCT  43.4 06/14/2023   MCV 87.1 06/14/2023   PLT 292 06/14/2023   Lab Results  Component Value Date   CREATININE 1.51 (H) 09/02/2023   BUN 18 09/02/2023   NA 141 09/02/2023   K 4.0 09/02/2023   CL 106 09/02/2023   CO2 23 09/02/2023   Lab Results  Component Value Date   CHOL 167 06/11/2023   HDL 56 06/11/2023   LDLCALC 105 (H) 06/11/2023   TRIG 31 06/11/2023   CHOLHDL 3.0 06/11/2023    Lab Results  Component Value Date    HGBA1C 5.2 06/10/2023   Assessment & Plan    Assessment and Plan Assessment & Plan     1.Coronary artery disease: -s/p posterior MI on 06/10/2023 with LHC performed showing patent coronaries 100% lateral second OM subbranch occlusion treated medically - Today Jeremy Hill reports   2.  Essential hypertension  3.  Hyperlipidemia  4.History of central retinal vein occlusion of left eye: - Patient reports no residual vision changes - Continue current GDMT with ASA 81 mg, Plavix  75 mg and good blood control with current BP regimen.      Disposition: Follow-up with Jeremy K Thukkani, MD or APP in *** months {Are you ordering a CV Procedure (e.g. stress test, cath, DCCV, TEE, etc)?   Press F2        :[id number]}   Signed, Jeremy Hill, Jeremy Shove, NP 10/18/2023, 1:18 PM [facility]

## 2023-10-19 ENCOUNTER — Ambulatory Visit: Attending: Nurse Practitioner | Admitting: Nurse Practitioner

## 2023-10-19 DIAGNOSIS — I2510 Atherosclerotic heart disease of native coronary artery without angina pectoris: Secondary | ICD-10-CM

## 2023-10-19 DIAGNOSIS — E785 Hyperlipidemia, unspecified: Secondary | ICD-10-CM

## 2023-10-19 DIAGNOSIS — H348122 Central retinal vein occlusion, left eye, stable: Secondary | ICD-10-CM

## 2023-10-19 DIAGNOSIS — I10 Essential (primary) hypertension: Secondary | ICD-10-CM

## 2023-11-16 ENCOUNTER — Telehealth: Payer: Self-pay

## 2023-11-16 NOTE — Telephone Encounter (Unsigned)
 Copied from CRM [id number]. Topic: Clinical - Medication Refill >> Nov 16, 2023  9:41 AM Diannia H wrote: Medication:  HYDROcodone -acetaminophen  (NORCO) 10-325 MG tablet   Has the patient contacted their pharmacy? Yes (Agent: If no, request that the patient contact the pharmacy for the refill. If patient does not wish to contact the pharmacy document the reason why and proceed with request.) (Agent: If yes, when and what did the pharmacy advise?)  This is the patient's preferred pharmacy:  [facility] [phone] - [place], [place] - [address] AT [facility] OF [address] [place] [address] Phone: [phone] Fax: [phone]  Is this the correct pharmacy for this prescription? Yes If no, delete pharmacy and type the correct one.   Has the prescription been filled recently? Yes  Is the patient out of the medication? No  Has the patient been seen for an appointment in the last year OR does the patient have an upcoming appointment? Yes  Can we respond through MyChart? Yes  Agent: Please be advised that Rx refills may take up to 3 business days. We ask that you follow-up with your pharmacy.

## 2023-11-17 MED ORDER — HYDROCODONE-ACETAMINOPHEN 10-325 MG PO TABS: 1.0000 | ORAL_TABLET | Freq: Three times a day (TID) | ORAL | 0 refills | Status: AC | PRN

## 2023-12-22 ENCOUNTER — Telehealth: Payer: Self-pay

## 2023-12-22 NOTE — Telephone Encounter (Signed)
 Copied from CRM [id number]. Topic: Clinical - Medication Refill >> Dec 22, 2023  8:22 AM Carrielelia G wrote: Medication: HYDROcodone -acetaminophen  (NORCO) 10-325 MG tablet [phone]  Has the patient contacted their pharmacy? No (Agent: If no, request that the patient contact the pharmacy for the refill. If patient does not wish to contact the pharmacy document the reason why and proceed with request.) (Agent: If yes, when and what did the pharmacy advise?)  This is the patient's preferred pharmacy:  [facility] [phone] - [place], [place] - [address] AT [facility] OF [address] [place] [address] Phone: [phone] Fax: [phone]  Is this the correct pharmacy for this prescription? Yes If no, delete pharmacy and type the correct one.    Is the patient out of the medication? No  Has the patient been seen for an appointment in the last year OR does the patient have an upcoming appointment? Yes  Can we respond through MyChart? No  Agent: Please be advised that Rx refills may take up to 3 business days. We ask that you follow-up with your pharmacy.

## 2023-12-22 NOTE — Telephone Encounter (Signed)
 Hydrocodone  is not on current med list. LOV - 08/11/23.

## 2023-12-23 ENCOUNTER — Telehealth: Payer: Self-pay | Admitting: *Deleted

## 2023-12-23 ENCOUNTER — Other Ambulatory Visit: Payer: Self-pay

## 2023-12-23 MED ORDER — HYDROCODONE-ACETAMINOPHEN 10-325 MG PO TABS: 1.0000 | ORAL_TABLET | Freq: Three times a day (TID) | ORAL | 0 refills | Status: DC | PRN

## 2023-12-23 NOTE — Telephone Encounter (Signed)
 Will forward to PCP.   Copied from CRM #[id number]. Topic: Clinical - Prescription Issue >> Dec 23, 2023  2:55 PM Miquel SAILOR wrote: Reason for CRM: HYDROcodone -acetaminophen  [facility]) 10-325 MG tablet [[id number]]  ENDED-PT requested for refill but not on list. Only has 6 pills left call back when updated [phone]  Location:  [id number] - [place], [place] - [address] AT [facility] OF [address] [place] [address] Phone: [phone] Fax: [phone] Hours: Not open 24 hours

## 2023-12-24 ENCOUNTER — Telehealth: Payer: Self-pay

## 2023-12-24 NOTE — Telephone Encounter (Signed)
 Jeremy Hill (Key: [facility]) PA Case ID #: [id number] Need Help? Call us  at [phone] Outcome Approved today by [facility] NCPDP 2017 PA Case: [id number], Status: Approved, Coverage Starts on: 03/03/2023 12:00:00 AM, Coverage Ends on: 03/01/2025 12:00:00 AM. Questions? Contact [phone]. Effective Date: 03/03/2023 Authorization Expiration Date: 03/01/2025 Drug HYDROcodone -Acetaminophen  10-325MG  tablets ePA cloud logo Form [employer] Electronic PA Form  Lvm for patient regarding the approval.

## 2023-12-24 NOTE — Telephone Encounter (Signed)
 Prior Authorization for patient (HYDROcodone -Acetaminophen  10-325MG  tablets) came through on cover my meds was submitted with last office notes awaiting approval or denial.  [id number]

## 2024-01-12 ENCOUNTER — Telehealth: Payer: Self-pay

## 2024-01-12 ENCOUNTER — Other Ambulatory Visit: Payer: Self-pay

## 2024-01-12 ENCOUNTER — Ambulatory Visit (INDEPENDENT_AMBULATORY_CARE_PROVIDER_SITE_OTHER)

## 2024-01-12 VITALS — BP 168/89 | HR 61 | Temp 98.0°F | Ht 73.0 in | Wt 222.2 lb

## 2024-01-12 DIAGNOSIS — Z79899 Other long term (current) drug therapy: Secondary | ICD-10-CM

## 2024-01-12 DIAGNOSIS — Z8739 Personal history of other diseases of the musculoskeletal system and connective tissue: Secondary | ICD-10-CM

## 2024-01-12 DIAGNOSIS — I10 Essential (primary) hypertension: Secondary | ICD-10-CM

## 2024-01-12 DIAGNOSIS — Z23 Encounter for immunization: Secondary | ICD-10-CM | POA: Diagnosis not present

## 2024-01-12 DIAGNOSIS — I2510 Atherosclerotic heart disease of native coronary artery without angina pectoris: Secondary | ICD-10-CM | POA: Diagnosis not present

## 2024-01-12 DIAGNOSIS — F1190 Opioid use, unspecified, uncomplicated: Secondary | ICD-10-CM | POA: Diagnosis not present

## 2024-01-12 DIAGNOSIS — M48062 Spinal stenosis, lumbar region with neurogenic claudication: Secondary | ICD-10-CM | POA: Diagnosis not present

## 2024-01-12 MED ORDER — VALSARTAN-HYDROCHLOROTHIAZIDE 160-12.5 MG PO TABS
1.0000 | ORAL_TABLET | Freq: Every day | ORAL | 3 refills | Status: AC
Start: 2024-01-12 — End: ?

## 2024-01-12 MED ORDER — ASPIRIN 81 MG PO TBEC: 81.0000 mg | DELAYED_RELEASE_TABLET | Freq: Every day | ORAL | 3 refills | Status: AC

## 2024-01-12 MED ORDER — HYDROCODONE-ACETAMINOPHEN 10-325 MG PO TABS: 1.0000 | ORAL_TABLET | Freq: Three times a day (TID) | ORAL | 0 refills | Status: DC | PRN

## 2024-01-12 MED ORDER — CHLORTHALIDONE 25 MG PO TABS: 25.0000 mg | ORAL_TABLET | Freq: Every day | ORAL | 3 refills | Status: DC

## 2024-01-12 MED ORDER — LIDOCAINE 5 % EX PTCH: 1.0000 | MEDICATED_PATCH | CUTANEOUS | 0 refills | Status: AC

## 2024-01-12 NOTE — Telephone Encounter (Signed)
 Jeremy Hill (KeyBETHA GOODWILL) PA Case ID #: [id number] Rx #: [id number] Need Help? Call us  at [phone] Outcome Approved today by [facility] NCPDP 2017 PA Case: [id number], Status: Approved, Coverage Starts on: 03/03/2023 12:00:00 AM, Coverage Ends on: 03/01/2025 12:00:00 AM. Questions? Contact [phone]. Effective Date: 03/03/2023 Authorization Expiration Date: 03/01/2025 Drug Lidocaine  5% patches ePA cloud Engineer, [employer] Electronic PA Form Original Claim Info [id number] PA REQD CALL [phone] REQUIRED

## 2024-01-12 NOTE — Progress Notes (Signed)
 Subjective:   Patient ID: Jeremy Hill male   DOB: Jul 22, 1945 78 y.o.   MRN: [id number]  HPI: Jeremy Hill is a 78 y.o. M PMH lumbar stenosis on chronic opioids, prostate cancer s/p brachytherapy 2020, HTN, CAD c/b STEMI 06/2022 who presents for routine follow up.   No acute complaints today.   CAD c/b STEMI 06/2023 s/p LHC 06/11/23 (100% stenosis of lateral 2nd marginal lesion) managed medically. Was recommended for DAPT x6 months with transition to aspirin  monotherapy 01/2024 and aggressive BP control. Now s/p completion of 6 months of DAPT. No recurrent chest pain, dyspnea, abdominal pain.  Lumbar stenosis - currently on hydrocodone -acetaminophen  (2-3 during the day and occasionally takes another dose at night). Does feel like it has worsened over the past few years. Lower back pain radiates into his LE's, does admit to claudication symptoms. Pain is worse at night but denies any fevers, weight loss, night sweats, bowel/bladder symptoms. Previously worked w/ PT but not recently.   HTN - currently on lisinopril  40 mg, toprol  25 mg with suboptimal control in office today. Does not check BP at home.  Past Medical History:  Diagnosis Date   Anemia 2008   borderline low Hg/Hct = 12.6/39.6 (01/10/2007), no anemia panel available and patient had refused colonoscopy, last colonoscopy done was in 2005 and the results were normal, done by Dr. Kristie   Depression    Hypertension    Neutropenia 2008   noted on cbc (01/10/2007) WBC = 3.9, also CBC done 08/2006 showed WBC = 3.0, unclear etiology; CXR done 08/2006 showed questionable right lung nodule and the follow up CT was recommended, there is no data in the EChart that it was done   Osteomyelitis (HCC) 12/2001   per MRI of the spine - GIVEN THE ABNORMALITY AT THE T8-9 LEVEL, INFECTION AT THESE LATTER REGIONS CANNOT BE COMPLETELY EXCLUDED AND  WILL NEED TO BE FOLLOWED CLOSELY. SCATTERED DEGENERATIVE CHANGES IN THE LOWER  THORACIC/LUMBAR SPINE  AT  THE L4-5 SIGNIFICANT NEURAL FORAMINAL NARROWING (R>L).  DECREASED SIGNAL INTENSITY OF BONE MARROW. UNDERLYING ANEMIA/INFILTRATIVE PROCESS/ LYMPHOMA.    Osteomyelitis, chronic (HCC) 2004   persistant osteomyelitis per MRI 04/2002 and also progressive at the same level as in 2003 T8-9 level   Prostate cancer [facility])    STEMI (ST elevation myocardial infarction) (HCC) 06/11/2023   Current Outpatient Medications  Medication Sig Dispense Refill   aspirin  EC 81 MG tablet Take 1 tablet (81 mg total) by mouth daily. Swallow whole. 90 tablet 3   clopidogrel  (PLAVIX ) 75 MG tablet Take 1 tablet (75 mg total) by mouth daily. 90 tablet 1   HYDROcodone -acetaminophen  (NORCO) 10-325 MG tablet Take 1 tablet by mouth every 8 (eight) hours as needed for severe pain (pain score 7-10). 30 tablet 0   metoprolol  succinate (TOPROL -XL) 25 MG 24 hr tablet Take 1 tablet (25 mg total) by mouth daily. 90 tablet 3   mirtazapine  (REMERON ) 7.5 MG tablet Take 1 tablet (7.5 mg total) by mouth at bedtime. 90 tablet 1   nitroGLYCERIN  (NITROSTAT ) 0.4 MG SL tablet Place 1 tablet (0.4 mg total) under the tongue every 5 (five) minutes as needed. 25 tablet 3   ondansetron  (ZOFRAN -ODT) 4 MG disintegrating tablet Take 1 tablet (4 mg total) by mouth every 8 (eight) hours as needed for nausea or vomiting. 20 tablet 0   polyethylene glycol (MIRALAX ) 17 g packet Take 17 g by mouth daily. 30 packet 1   rosuvastatin  (CRESTOR ) 40 MG tablet Take  1 tablet (40 mg total) by mouth daily. 90 tablet 3   No current facility-administered medications for this visit.   Family History  Problem Relation Age of Onset   Heart disease Mother    Heart disease Father    Diabetes Sister    Breast cancer Sister    Suicidality Brother    Prostate cancer Neg Hx    Colon cancer Neg Hx    Pancreatic cancer Neg Hx    Social History   Socioeconomic History   Marital status: Divorced    Spouse name: Not on file   Number of children: 3   Years of education:  Not on file   Highest education level: Not on file  Occupational History    Comment: retired  Tobacco Use   Smoking status: Never   Smokeless tobacco: Never  Vaping Use   Vaping status: Never Used  Substance and Sexual Activity   Alcohol use: No    Alcohol/week: 0.0 standard drinks of alcohol   Drug use: No   Sexual activity: Not Currently  Other Topics Concern   Not on file  Social History Narrative   Recently divorced.    Social Drivers of Corporate Investment Banker Strain: Low Risk  (07/08/2023)   Overall Financial Resource Strain (CARDIA)    Difficulty of Paying Living Expenses: Not hard at all  Food Insecurity: No Food Insecurity (07/08/2023)   Hunger Vital Sign    Worried About Running Out of Food in the Last Year: Never true    Ran Out of Food in the Last Year: Never true  Recent Concern: Food Insecurity - Food Insecurity Present (06/11/2023)   Hunger Vital Sign    Worried About Running Out of Food in the Last Year: Never true    Ran Out of Food in the Last Year: Sometimes true  Transportation Needs: No Transportation Needs (07/08/2023)   PRAPARE - Administrator, Civil Service (Medical): No    Lack of Transportation (Non-Medical): No  Physical Activity: Inactive (07/08/2023)   Exercise Vital Sign    Days of Exercise per Week: 0 days    Minutes of Exercise per Session: 0 min  Stress: No Stress Concern Present (07/08/2023)   [employer] of Occupational Health - Occupational Stress Questionnaire    Feeling of Stress : Not at all  Social Connections: Moderately Integrated (07/08/2023)   Social Connection and Isolation Panel    Frequency of Communication with Friends and Family: More than three times a week    Frequency of Social Gatherings with Friends and Family: More than three times a week    Attends Religious Services: More than 4 times per year    Active Member of [employer] or Organizations: Yes    Attends Engineer, Structural: More than 4 times per  year    Marital Status: Divorced   Review of Systems: Pertinent items are noted in HPI. Objective:   Vitals:   01/12/24 1038 01/12/24 1043  BP: (!) 172/96 (!) 168/89  Pulse: 60 61  Temp: 98 F (36.7 C)   TempSrc: Oral   SpO2: 100%   Weight: 222 lb 3.2 oz (100.8 kg)   Height: 6' 1 (1.854 m)     Physical Exam: GEN: Well appearing, NAD. Oriented x 3, normal mood and affect  HEENT: Normocephalic, atraumatic, Conjunctiva clear, sclera non-icteric, EOM intact CV: RRR, no M/R/G PULM: CTAB MSK: b/l LE wnl NEURO: mild TTP along lumbar spine and adjacent paraspinal  muscles, moving all extremities spontaneously in upper and lower extremities. PSYCH: Oriented X3, intact recent and remote memory, judgment and insight, normal mood and affect.   Assessment & Plan:   Assessment & Plan Spinal stenosis of lumbar region with neurogenic claudication Longstanding lower back pain for which he is on chronic opioid therapy. He has a remote history of thoracic diskitis/osteomyelitis 2003 that resolved on most recent imaging MRI 06/2004, which showed improvement in T8-9 diskitis/osteomyelitis with no e/o active infection as well as mild central stenosis L4-5 and disk protrustion L1-L2. He has not had T/L spine imaging since then. I do think repeat imaging would be helpful given his history and worsening pain.  - Sports medicine referral  - PT referral  - MRI w/wo T-spine, L-spine Essential hypertension Suboptimal control with current regimen of lisinopril  40 mg and metoprolol  succinate 25 mg.  - replace lisinopril  40 mg with valsartan -hydrochlorothiazide  160/12.5 mg - cont metoprolol  succinate  - RTC 4 weeks for further HTN management Coronary artery disease involving native coronary artery of native heart without angina pectoris Cont aspirin  monotherapy, Plavix  discontinued now that he is >6 months from his STEMI Encounter for immunization Flu shot/Tdap administered today Chronic, continuous use  of opioids He is on chronic opioids for lower back pain. He does not want to be on these medications long term and would like to come off opioids in the coming months. As we re-initiate physical therapy and multimodal pain management, we are hopeful in being able to wean his opioids. History of osteomyelitis MRI as above  Jone Dauphin MD

## 2024-01-12 NOTE — Assessment & Plan Note (Addendum)
 Longstanding lower back pain for which he is on chronic opioid therapy. He has a remote history of thoracic diskitis/osteomyelitis 2003 that resolved on most recent imaging MRI 06/2004, which showed improvement in T8-9 diskitis/osteomyelitis with no e/o active infection as well as mild central stenosis L4-5 and disk protrustion L1-L2. He has not had T/L spine imaging since then. I do think repeat imaging would be helpful given his history and worsening pain.  - Sports medicine referral  - PT referral  - MRI w/wo T-spine, L-spine

## 2024-01-12 NOTE — Assessment & Plan Note (Addendum)
 Suboptimal control with current regimen of lisinopril  40 mg and metoprolol  succinate 25 mg.  - replace lisinopril  40 mg with valsartan -hydrochlorothiazide  160/12.5 mg - cont metoprolol  succinate  - RTC 4 weeks for further HTN management

## 2024-01-12 NOTE — Patient Instructions (Addendum)
 Thank you for coming in today. If you have any questions or concerns, please feel free to contact me via MyChart or call the office.   If you have gotten labs today, I will be in touch via MyChart or telephone.   I have changed your blood pressure medications. Please STOP taking lisinopril  and start taking the new medication that we started. Take your blood pressure twice a day and record your blood pressures in the log. Please bring your log and your blood pressure cuff to your next visit.   Have a great day.

## 2024-01-13 ENCOUNTER — Ambulatory Visit: Admitting: Licensed Clinical Social Worker

## 2024-01-13 NOTE — Assessment & Plan Note (Signed)
 Cont aspirin  monotherapy, Plavix  discontinued now that he is >6 months from his STEMI

## 2024-01-18 ENCOUNTER — Telehealth: Payer: Self-pay | Admitting: *Deleted

## 2024-01-18 NOTE — Telephone Encounter (Unsigned)
 Copied from CRM [id number]. Topic: Clinical - Prescription Issue >> Jan 18, 2024  8:41 AM Farrel B wrote: Reason for CRM: HYDROcodone -acetaminophen  (NORCO) 10-325 MG tablet patient states that he received notice that his pain medication quantity is incorrect he stated that normally receive like 80 or 90 per month due to the nature of the pain, and the pharmacy states 30 was what was sent to him.

## 2024-01-20 NOTE — Telephone Encounter (Signed)
 Called pt - no answer; left message on self-identified vm to call the office when he's close to running out of Hydrocodone  and his doctor will send in new rx for 80 tabs. And to call the office for any questions.

## 2024-01-25 ENCOUNTER — Ambulatory Visit: Admitting: Family Medicine

## 2024-02-07 ENCOUNTER — Ambulatory Visit: Admitting: Family Medicine

## 2024-02-07 ENCOUNTER — Encounter: Payer: Self-pay | Admitting: Family Medicine

## 2024-02-07 VITALS — BP 178/88 | HR 70 | Ht 73.0 in | Wt 228.0 lb

## 2024-02-07 DIAGNOSIS — G8929 Other chronic pain: Secondary | ICD-10-CM

## 2024-02-07 DIAGNOSIS — M48061 Spinal stenosis, lumbar region without neurogenic claudication: Secondary | ICD-10-CM

## 2024-02-07 DIAGNOSIS — M545 Low back pain, unspecified: Secondary | ICD-10-CM

## 2024-02-07 NOTE — Progress Notes (Signed)
   I, Jeremy Hill, CMA acting as a scribe for Jeremy Lloyd, MD.  Jeremy Hill is a 78 y.o. male who presents to [facility] at [facility] today for LBP. Hx of lumbar spinal stenosis. Pt locates pain to midline lower back pain. Notes back pain since 2003, waxing and waning.   Radiating pain: localized LE numbness/tingling: denies LE weakness: denies Aggravates: laying down for bed, house chores, sit-to-stand Treatments tried: Oxycodone   Pertinent review of systems: No fevers or chills  Relevant historical information: Prostate cancer.  Heart disease.   Exam:  BP (!) 178/88   Pulse 70   Ht 6' 1 (1.854 m)   Wt 228 lb (103.4 kg)   SpO2 98%   BMI 30.08 kg/m  General: Well Developed, well nourished, and in no acute distress.   MSK: L-spine: Normal-appearing Nontender palpation spinal midline. Decreased lumbar motion.  Extension is very limited. Lower extremity strength is intact. Reflexes are intact.    Lab and Radiology Results  X-ray images lumbar spine visible on CT scan abdomen and pelvis April 2025 personally and independently interpreted. Multilevel degenerative changes with facet arthritis are present.     Assessment and Plan: 78 y.o. male with chronic low back pain without significant radiculopathy.  This is concerning for spinal stenosis.  Plan for physical therapy.  If not improved consider MRI for potential injection planning.  Recheck in 2 months.   PDMP not reviewed this encounter. Orders Placed This Encounter  Procedures   Ambulatory referral to Physical Therapy    Referral Priority:   Routine    Referral Type:   Physical Medicine    Referral Reason:   Specialty Services Required    Requested Specialty:   Physical Therapy    Number of Visits Requested:   1   No orders of the defined types were placed in this encounter.    Discussed warning signs or symptoms. Please see discharge instructions. Patient expresses  understanding.   The above documentation has been reviewed and is accurate and complete Jeremy Hill, M.D.

## 2024-02-07 NOTE — Patient Instructions (Signed)
Thank you for coming in today.   I've referred you to Physical Therapy.  Let us know if you don't hear from them in one week.   Check back in 2 months

## 2024-02-16 ENCOUNTER — Ambulatory Visit

## 2024-02-16 ENCOUNTER — Other Ambulatory Visit: Payer: Self-pay

## 2024-02-16 VITALS — BP 159/87 | HR 60 | Temp 98.1°F | Ht 73.0 in | Wt 228.0 lb

## 2024-02-16 DIAGNOSIS — E785 Hyperlipidemia, unspecified: Secondary | ICD-10-CM | POA: Diagnosis not present

## 2024-02-16 DIAGNOSIS — N1831 Chronic kidney disease, stage 3a: Secondary | ICD-10-CM | POA: Diagnosis not present

## 2024-02-16 DIAGNOSIS — M48061 Spinal stenosis, lumbar region without neurogenic claudication: Secondary | ICD-10-CM | POA: Diagnosis not present

## 2024-02-16 DIAGNOSIS — Z79899 Other long term (current) drug therapy: Secondary | ICD-10-CM

## 2024-02-16 DIAGNOSIS — Z8249 Family history of ischemic heart disease and other diseases of the circulatory system: Secondary | ICD-10-CM

## 2024-02-16 DIAGNOSIS — I129 Hypertensive chronic kidney disease with stage 1 through stage 4 chronic kidney disease, or unspecified chronic kidney disease: Secondary | ICD-10-CM

## 2024-02-16 DIAGNOSIS — I10 Essential (primary) hypertension: Secondary | ICD-10-CM

## 2024-02-16 MED ORDER — HYDROCODONE-ACETAMINOPHEN 10-325 MG PO TABS: 1.0000 | ORAL_TABLET | Freq: Three times a day (TID) | ORAL | 0 refills | Status: DC | PRN

## 2024-02-16 MED ORDER — AMLODIPINE-VALSARTAN-HCTZ 5-160-25 MG PO TABS: 1.0000 | ORAL_TABLET | Freq: Every day | ORAL | 1 refills | Status: AC

## 2024-02-16 NOTE — Patient Instructions (Signed)
 Thank you for coming in today. If you have any questions or concerns, please feel free to contact me via MyChart or call the office.   If you have gotten labs today, I will be in touch via MyChart or telephone.   Have a great day.

## 2024-02-16 NOTE — Assessment & Plan Note (Signed)
 Cont to follow up with Sports Medicine, PT.

## 2024-02-16 NOTE — Assessment & Plan Note (Signed)
 Add add'l agent given poor BP control. Will start amlodipine -valsartan -hydrochlorothiazide  5/160/25. BP log provided. Check BMP.

## 2024-02-16 NOTE — Assessment & Plan Note (Signed)
 Last LDL 06/2023 was 105. Recheck lipid panel today. Consider zetia initiation.

## 2024-02-16 NOTE — Progress Notes (Signed)
 Subjective:   Patient ID: Jeremy Hill male   DOB: Jun 07, 1945 78 y.o.   MRN: [id number]  HPI: Jeremy Hill is a 78 y.o. M PMH lumbar stenosis on chronic opioids, prostate cancer s/p brachytherapy 2020, HTN, CAD c/b STEMI 06/2022 who presents for follow up.   Lumbar stenosis - saw Sports Medicine who recommended PT which he has been planning on doing.   HTN - was switched from lisinopril  40 mg to valsartan -hydrochlorothiazide  160/12.5 which he has been adherent to. BP remains elevated in office today 159/87. Will change his pill to amlodipine -valsartan -hydrochlorothiazide  5/160/25. BP log provided. Check BMP today as he has a history of CKD and with recent initiation of ARB.   HLD - on Crestor  40 mg, most recent LDL 06/2023 was 105 and not at goal given his history of CAD c/b STEMI. Will recheck lipid panel today and monitor for need for additional agent e.g. Zetia.   Past Medical History:  Diagnosis Date   Anemia 2008   borderline low Hg/Hct = 12.6/39.6 (01/10/2007), no anemia panel available and patient had refused colonoscopy, last colonoscopy done was in 2005 and the results were normal, done by Dr. Kristie   Depression    Hypertension    Neutropenia 2008   noted on cbc (01/10/2007) WBC = 3.9, also CBC done 08/2006 showed WBC = 3.0, unclear etiology; CXR done 08/2006 showed questionable right lung nodule and the follow up CT was recommended, there is no data in the EChart that it was done   Osteomyelitis (HCC) 12/2001   per MRI of the spine - GIVEN THE ABNORMALITY AT THE T8-9 LEVEL, INFECTION AT THESE LATTER REGIONS CANNOT BE COMPLETELY EXCLUDED AND  WILL NEED TO BE FOLLOWED CLOSELY. SCATTERED DEGENERATIVE CHANGES IN THE LOWER  THORACIC/LUMBAR SPINE  AT THE L4-5 SIGNIFICANT NEURAL FORAMINAL NARROWING (R>L).  DECREASED SIGNAL INTENSITY OF BONE MARROW. UNDERLYING ANEMIA/INFILTRATIVE PROCESS/ LYMPHOMA.    Osteomyelitis, chronic (HCC) 2004   persistant osteomyelitis per MRI 04/2002 and also  progressive at the same level as in 2003 T8-9 level   Prostate cancer [facility])    STEMI (ST elevation myocardial infarction) (HCC) 06/11/2023   Current Outpatient Medications  Medication Sig Dispense Refill   amLODIPine -Valsartan -HCTZ 5-160-25 MG TABS Take 1 tablet by mouth daily. 90 tablet 1   aspirin  EC 81 MG tablet Take 1 tablet (81 mg total) by mouth daily. Swallow whole. 90 tablet 3   HYDROcodone -acetaminophen  (NORCO) 10-325 MG tablet Take 1 tablet by mouth every 8 (eight) hours as needed for severe pain (pain score 7-10). 90 tablet 0   metoprolol  succinate (TOPROL -XL) 25 MG 24 hr tablet Take 1 tablet (25 mg total) by mouth daily. 90 tablet 3   mirtazapine  (REMERON ) 7.5 MG tablet Take 1 tablet (7.5 mg total) by mouth at bedtime. 90 tablet 1   nitroGLYCERIN  (NITROSTAT ) 0.4 MG SL tablet Place 1 tablet (0.4 mg total) under the tongue every 5 (five) minutes as needed. 25 tablet 3   ondansetron  (ZOFRAN -ODT) 4 MG disintegrating tablet Take 1 tablet (4 mg total) by mouth every 8 (eight) hours as needed for nausea or vomiting. 20 tablet 0   polyethylene glycol (MIRALAX ) 17 g packet Take 17 g by mouth daily. 30 packet 1   rosuvastatin  (CRESTOR ) 40 MG tablet Take 1 tablet (40 mg total) by mouth daily. 90 tablet 3   No current facility-administered medications for this visit.   Family History  Problem Relation Age of Onset   Heart disease Mother  Heart disease Father    Diabetes Sister    Breast cancer Sister    Suicidality Brother    Prostate cancer Neg Hx    Colon cancer Neg Hx    Pancreatic cancer Neg Hx    Social History   Socioeconomic History   Marital status: Divorced    Spouse name: Not on file   Number of children: 3   Years of education: Not on file   Highest education level: Not on file  Occupational History    Comment: retired  Tobacco Use   Smoking status: Never   Smokeless tobacco: Never  Vaping Use   Vaping status: Never Used  Substance and Sexual Activity    Alcohol use: No    Alcohol/week: 0.0 standard drinks of alcohol   Drug use: No   Sexual activity: Not Currently  Other Topics Concern   Not on file  Social History Narrative   Recently divorced.    Social Drivers of Health   Tobacco Use: Low Risk (02/16/2024)   Patient History    Smoking Tobacco Use: Never    Smokeless Tobacco Use: Never    Passive Exposure: Not on file  Financial Resource Strain: Low Risk (07/08/2023)   Overall Financial Resource Strain (CARDIA)    Difficulty of Paying Living Expenses: Not hard at all  Food Insecurity: No Food Insecurity (07/08/2023)   Hunger Vital Sign    Worried About Running Out of Food in the Last Year: Never true    Ran Out of Food in the Last Year: Never true  Recent Concern: Food Insecurity - Food Insecurity Present (06/11/2023)   Hunger Vital Sign    Worried About Running Out of Food in the Last Year: Never true    Ran Out of Food in the Last Year: Sometimes true  Transportation Needs: No Transportation Needs (07/08/2023)   PRAPARE - Administrator, Civil Service (Medical): No    Lack of Transportation (Non-Medical): No  Physical Activity: Inactive (07/08/2023)   Exercise Vital Sign    Days of Exercise per Week: 0 days    Minutes of Exercise per Session: 0 min  Stress: No Stress Concern Present (07/08/2023)   [employer] of Occupational Health - Occupational Stress Questionnaire    Feeling of Stress : Not at all  Social Connections: Moderately Integrated (07/08/2023)   Social Connection and Isolation Panel    Frequency of Communication with Friends and Family: More than three times a week    Frequency of Social Gatherings with Friends and Family: More than three times a week    Attends Religious Services: More than 4 times per year    Active Member of Clubs or Organizations: Yes    Attends Banker Meetings: More than 4 times per year    Marital Status: Divorced  Depression (PHQ2-9): Low Risk (02/16/2024)    Depression (PHQ2-9)    PHQ-2 Score: 0  Alcohol Screen: Low Risk (07/08/2023)   Alcohol Screen    Last Alcohol Screening Score (AUDIT): 0  Housing: Low Risk (07/08/2023)   Housing Stability Vital Sign    Unable to Pay for Housing in the Last Year: No    Number of Times Moved in the Last Year: 0    Homeless in the Last Year: No  Utilities: Not At Risk (07/08/2023)   AHC Utilities    Threatened with loss of utilities: No  Health Literacy: Adequate Health Literacy (07/08/2023)   B1300 Health Literacy  Frequency of need for help with medical instructions: Rarely   Review of Systems: Pertinent items are noted in HPI. Objective:   Vitals:   02/16/24 1001 02/16/24 1003  BP: (!) 177/91 (!) 165/91  Pulse: 64 (!) 59  Temp: 98.1 F (36.7 C)   TempSrc: Oral   SpO2: 100%   Weight: 228 lb (103.4 kg)   Height: 6' 1 (1.854 m)     Physical Exam: GEN: Well appearing, NAD. Oriented x 3, normal mood and affect  HEENT: Normocephalic, atraumatic, Conjunctiva clear, sclera non-icteric CV: RRR, no M/R/G PULM: CTAB NEURO: moving all extremities spontaneously in upper and lower extremities. PSYCH: Oriented X3, intact recent and remote memory, judgment and insight, normal mood and affect.   Assessment & Plan:   Assessment & Plan CKD stage 3a, GFR 45-59 ml/min (HCC) Recheck BMP. Hyperlipidemia, unspecified hyperlipidemia type Last LDL 06/2023 was 105. Recheck lipid panel today. Consider zetia initiation. Spinal stenosis of lumbar region without neurogenic claudication Cont to follow up with Sports Medicine, PT.  Essential hypertension Add add'l agent given poor BP control. Will start amlodipine -valsartan -hydrochlorothiazide  5/160/25. BP log provided. Check BMP.  RTC 3-6 months.  Jone Dauphin MD

## 2024-02-17 LAB — BASIC METABOLIC PANEL WITH GFR
BUN/Creatinine Ratio: 10 (ref 10–24)
BUN: 14 mg/dL (ref 8–27)
CO2: 26 mmol/L (ref 20–29)
Calcium: 9.6 mg/dL (ref 8.6–10.2)
Chloride: 100 mmol/L (ref 96–106)
Creatinine, Ser: 1.37 mg/dL — ABNORMAL HIGH (ref 0.76–1.27)
Glucose: 98 mg/dL (ref 70–99)
Potassium: 4 mmol/L (ref 3.5–5.2)
Sodium: 140 mmol/L (ref 134–144)
eGFR: 53 mL/min/1.73 — ABNORMAL LOW (ref >59)

## 2024-02-17 LAB — LIPID PANEL
Chol/HDL Ratio: 3.7 ratio (ref 0.0–5.0)
Cholesterol, Total: 167 mg/dL (ref 100–199)
HDL: 45 mg/dL (ref >39)
LDL Chol Calc (NIH): 108 mg/dL — ABNORMAL HIGH (ref 0–99)
Triglycerides: 76 mg/dL (ref 0–149)
VLDL Cholesterol Cal: 14 mg/dL (ref 5–40)

## 2024-02-18 ENCOUNTER — Ambulatory Visit: Payer: Self-pay

## 2024-02-18 MED ORDER — EZETIMIBE 10 MG PO TABS: 10.0000 mg | ORAL_TABLET | Freq: Every day | ORAL | 11 refills | Status: AC

## 2024-02-27 NOTE — Therapy (Incomplete)
 " OUTPATIENT PHYSICAL THERAPY THORACOLUMBAR EVALUATION   Patient Name: Jeremy Hill MRN: [id number] DOB:1945/04/07, 78 y.o., male Today's Date: 02/27/2024  END OF SESSION:   Past Medical History:  Diagnosis Date   Anemia 2008   borderline low Hg/Hct = 12.6/39.6 (01/10/2007), no anemia panel available and patient had refused colonoscopy, last colonoscopy done was in 2005 and the results were normal, done by Dr. Kristie   Depression    Hypertension    Neutropenia 2008   noted on cbc (01/10/2007) WBC = 3.9, also CBC done 08/2006 showed WBC = 3.0, unclear etiology; CXR done 08/2006 showed questionable right lung nodule and the follow up CT was recommended, there is no data in the EChart that it was done   Osteomyelitis (HCC) 12/2001   per MRI of the spine - GIVEN THE ABNORMALITY AT THE T8-9 LEVEL, INFECTION AT THESE LATTER REGIONS CANNOT BE COMPLETELY EXCLUDED AND  WILL NEED TO BE FOLLOWED CLOSELY. SCATTERED DEGENERATIVE CHANGES IN THE LOWER  THORACIC/LUMBAR SPINE  AT THE L4-5 SIGNIFICANT NEURAL FORAMINAL NARROWING (R>L).  DECREASED SIGNAL INTENSITY OF BONE MARROW. UNDERLYING ANEMIA/INFILTRATIVE PROCESS/ LYMPHOMA.    Osteomyelitis, chronic (HCC) 2004   persistant osteomyelitis per MRI 04/2002 and also progressive at the same level as in 2003 T8-9 level   Prostate cancer [facility])    STEMI (ST elevation myocardial infarction) (HCC) 06/11/2023   Past Surgical History:  Procedure Laterality Date   CYSTOSCOPY N/A 10/28/2018   Procedure: CYSTOSCOPY;  Surgeon: Ottelin, Mark, MD;  Location: [facility];  Service: Urology;  Laterality: N/A;  No seeds seen per Dr. Ceil   HERNIA REPAIR     LEFT HEART CATH AND CORONARY ANGIOGRAPHY N/A 06/11/2023   Procedure: LEFT HEART CATH AND CORONARY ANGIOGRAPHY;  Surgeon: Wendel Lurena POUR, MD;  [facility]: [facility];  Service: Cardiovascular;  Laterality: N/A;   LYMPH NODE BIOPSY  01/2002   right inguinal node biopsy (done secondary to finding  of neutropenia and lymphadenopathy) -  REACTIVE LYMPHOID HYPERPLASIA WITH SINUS HISTIOCYTOSIS AND PLASMACYTOSIS, no evidence of malignancy   PROSTATE BIOPSY     RADIOACTIVE SEED IMPLANT N/A 10/28/2018   Procedure: RADIOACTIVE SEED IMPLANT/BRACHYTHERAPY IMPLANT;  Surgeon: Ottelin, Mark, MD;  Location: [facility];  Service: Urology;  Laterality: N/A;   RHINOPLASTY     SPACE OAR INSTILLATION N/A 10/28/2018   Procedure: SPACE OAR INSTILLATION;  Surgeon: Ottelin, Mark, MD;  Location: [facility];  Service: Urology;  Laterality: N/A;   Patient Active Problem List   Diagnosis Date Noted   Stress at home 08/04/2023   AKI (acute kidney injury) 07/21/2023   CAD (coronary artery disease) 06/23/2023   Malignant neoplasm of prostate (HCC) 06/08/2018   Overweight (BMI 25.0-29.9) 04/16/2017   HLD (hyperlipidemia) 03/17/2017   Central retinal vein occlusion, left eye (HCC) 09/18/2015   BPH (benign prostatic hyperplasia) 05/11/2015   Constipation 07/18/2014   Psoriasis 12/12/2012   Essential hypertension 12/17/2005   Spinal stenosis 12/17/2005    PCP: Shawn Sick, MD   REFERRING PROVIDER: Joane Artist RAMAN, MD   REFERRING DIAG: M54.42,M54.41,G89.29 (ICD-10-CM) - Chronic bilateral low back pain with bilateral sciatica   Rationale for Evaluation and Treatment: Rehabilitation  THERAPY DIAG:  No diagnosis found.  ONSET DATE: ***  SUBJECTIVE:  SUBJECTIVE STATEMENT: ***  PERTINENT HISTORY:  ***  PAIN:  Are you having pain? Yes: NPRS scale: *** Pain location: *** Pain description: *** Aggravating factors: *** Relieving factors: ***  PRECAUTIONS: None  RED FLAGS: Bowel or bladder incontinence: {Yes/No:304960894}   WEIGHT BEARING RESTRICTIONS: Yes ***  FALLS:  Has patient  fallen in last 6 months? {fallsyesno:27318}  LIVING ENVIRONMENT: Lives with: {[facility] lives with:25569::lives with their family} Lives in: {Lives in:25570} Stairs: {opstairs:27293} Has following equipment at home: {Assistive devices:23999}  OCCUPATION: ***  PLOF: {PLOF:24004}  PATIENT GOALS: ***  NEXT MD VISIT: ***  OBJECTIVE:  Note: Objective measures were completed at Evaluation unless otherwise noted.  DIAGNOSTIC FINDINGS:  MRI pending  PATIENT SURVEYS:  PSFS: THE PATIENT SPECIFIC FUNCTIONAL SCALE  Place score of 0-10 (0 = unable to perform activity and 10 = able to perform activity at the same level as before injury or problem)  Activity Date: 02/28/24         2.     3.     4.      Total Score ***      Total Score = Sum of activity scores/number of activities  Minimally Detectable Change: 3 points (for single activity); 2 points (for average score)  [facility] (nd). The Patient Specific Functional Scale . Retrieved from [employer]ateo[employer].com.[email or web address]   COGNITION: Overall cognitive status: Within functional limits for tasks assessed     SENSATION: {sensation:27233}  MUSCLE LENGTH: Hamstrings: Right *** deg; Left *** deg Debby test: Right *** deg; Left *** deg  POSTURE: {posture:25561}  PALPATION: ***  LUMBAR ROM:   AROM Eval 02/28/24  Flexion   Extension   Right lateral flexion   Left lateral flexion   Right rotation   Left rotation    (Blank rows = not tested)  LOWER EXTREMITY ROM:     Active  Right eval Left eval  Hip flexion    Hip extension    Hip abduction    Hip adduction    Hip internal rotation    Hip external rotation    Knee flexion    Knee extension    Ankle dorsiflexion    Ankle plantarflexion    Ankle inversion    Ankle eversion     (Blank rows = not tested)  LOWER EXTREMITY MMT:    MMT Right eval Left eval  Hip flexion *** ***  Hip extension    Hip  abduction    Hip adduction    Hip internal rotation    Hip external rotation    Knee flexion    Knee extension    Ankle dorsiflexion    Ankle plantarflexion    Ankle inversion    Ankle eversion     (Blank rows = not tested)  LUMBAR SPECIAL TESTS:  Straight leg raise test: {pos/neg:25243}  FUNCTIONAL TESTS:  5 times sit to stand: ***  GAIT: Distance walked: *** Assistive device utilized: {Assistive devices:23999} Level of assistance: {Levels of assistance:24026} Comments: ***  TREATMENT DATE: 02/28/24 Initial evaluation of lumbar spine completed followed by instruction and trial set of HEP.  PATIENT EDUCATION:  Education details: HEP Person educated: Patient Education method: Solicitor, Actor cues, Verbal cues, and Handouts Education comprehension: verbalized understanding and returned demonstration  HOME EXERCISE PROGRAM: ***  ASSESSMENT:  CLINICAL IMPRESSION: Patient is a 78y.o. male who was seen today for physical therapy evaluation and treatment for lumbar spine/sciatica.  Patient presents with*** Patient would benefit from skilled physical therapy services to address the above deficits and return to previous LOA. OBJECTIVE IMPAIRMENTS: {opptimpairments:25111}.   ACTIVITY LIMITATIONS: {activitylimitations:27494}  PARTICIPATION LIMITATIONS: {participationrestrictions:25113}  PERSONAL FACTORS: {Personal factors:25162} are also affecting patient's functional outcome.   REHAB POTENTIAL: {rehabpotential:25112}  CLINICAL DECISION MAKING: {clinical decision making:25114}  EVALUATION COMPLEXITY: {Evaluation complexity:25115}   GOALS: Goals reviewed with patient? Yes  SHORT TERM GOALS: Target date: ***  Patient to be independent with HEP. Baseline: Goal status: INITIAL  2.  Decrease pain by 1 level. Baseline:   Goal status: INITIAL  3.  *** Baseline:  Goal status: INITIAL  4.  *** Baseline:  Goal status: INITIAL  5.  *** Baseline:  Goal status: INITIAL  6.  *** Baseline:  Goal status: INITIAL  LONG TERM GOALS: Target date: ***  Patient to be independent with self progressive HEP at discharge. Baseline:  Goal status: INITIAL  2.  Decreased radiating pain occurrence by 25% or more. Baseline:  Goal status: INITIAL  3.  Increase active range of motion by*** Baseline:  Goal status: INITIAL  4.  Increase strength of lumbar stabilizers by half a grade or more at discharge. Baseline:  Goal status: INITIAL  5.  Decrease max pain to 3 out of 10 or less at discharge. Baseline:  Goal status: INITIAL  6.  *** Baseline:  Goal status: INITIAL  PLAN:  PT FREQUENCY: {rehab frequency:25116}  PT DURATION: {rehab duration:25117}  PLANNED INTERVENTIONS: 97164- PT Re-evaluation, 97110-Therapeutic exercises, 97530- Therapeutic activity, 97112- Neuromuscular re-education, 97535- Self Care, 02859- Manual therapy, [phone]- Gait training, [phone]- Electrical stimulation (unattended), Patient/Family education, Balance training, Stair training, Joint mobilization, Spinal mobilization, Cryotherapy, and Moist heat.  PLAN FOR NEXT SESSION: ***   Burnard CHRISTELLA Meth, PT 02/27/2024, 9:38 AM  "

## 2024-02-28 ENCOUNTER — Ambulatory Visit

## 2024-03-08 ENCOUNTER — Other Ambulatory Visit: Payer: Self-pay | Admitting: *Deleted

## 2024-03-09 MED ORDER — MIRTAZAPINE 7.5 MG PO TABS: 7.5000 mg | ORAL_TABLET | Freq: Every day | ORAL | 1 refills | Status: AC

## 2024-03-15 ENCOUNTER — Ambulatory Visit: Admitting: Physical Therapy

## 2024-03-15 NOTE — Therapy (Unsigned)
 " OUTPATIENT PHYSICAL THERAPY THORACOLUMBAR EVALUATION   Patient Name: Jeremy Hill MRN: [id number] DOB:1946-03-02, 79 y.o., male Today's Date: 03/15/2024  END OF SESSION:   Past Medical History:  Diagnosis Date   Anemia 2008   borderline low Hg/Hct = 12.6/39.6 (01/10/2007), no anemia panel available and patient had refused colonoscopy, last colonoscopy done was in 2005 and the results were normal, done by Dr. Kristie   Depression    Hypertension    Neutropenia 2008   noted on cbc (01/10/2007) WBC = 3.9, also CBC done 08/2006 showed WBC = 3.0, unclear etiology; CXR done 08/2006 showed questionable right lung nodule and the follow up CT was recommended, there is no data in the EChart that it was done   Osteomyelitis (HCC) 12/2001   per MRI of the spine - GIVEN THE ABNORMALITY AT THE T8-9 LEVEL, INFECTION AT THESE LATTER REGIONS CANNOT BE COMPLETELY EXCLUDED AND  WILL NEED TO BE FOLLOWED CLOSELY. SCATTERED DEGENERATIVE CHANGES IN THE LOWER  THORACIC/LUMBAR SPINE  AT THE L4-5 SIGNIFICANT NEURAL FORAMINAL NARROWING (R>L).  DECREASED SIGNAL INTENSITY OF BONE MARROW. UNDERLYING ANEMIA/INFILTRATIVE PROCESS/ LYMPHOMA.    Osteomyelitis, chronic (HCC) 2004   persistant osteomyelitis per MRI 04/2002 and also progressive at the same level as in 2003 T8-9 level   Prostate cancer [facility])    STEMI (ST elevation myocardial infarction) (HCC) 06/11/2023   Past Surgical History:  Procedure Laterality Date   CYSTOSCOPY N/A 10/28/2018   Procedure: CYSTOSCOPY;  Surgeon: Ottelin, Mark, MD;  Location: [facility];  Service: Urology;  Laterality: N/A;  No seeds seen per Dr. Ceil   HERNIA REPAIR     LEFT HEART CATH AND CORONARY ANGIOGRAPHY N/A 06/11/2023   Procedure: LEFT HEART CATH AND CORONARY ANGIOGRAPHY;  Surgeon: Wendel Lurena POUR, MD;  Location[facility];  Service: Cardiovascular;  Laterality: N/A;   LYMPH NODE BIOPSY  01/2002   right inguinal node biopsy (done secondary to finding  of neutropenia and lymphadenopathy) -  REACTIVE LYMPHOID HYPERPLASIA WITH SINUS HISTIOCYTOSIS AND PLASMACYTOSIS, no evidence of malignancy   PROSTATE BIOPSY     RADIOACTIVE SEED IMPLANT N/A 10/28/2018   Procedure: RADIOACTIVE SEED IMPLANT/BRACHYTHERAPY IMPLANT;  Surgeon: Ottelin, Mark, MD;  Location: [facility];  Service: Urology;  Laterality: N/A;   RHINOPLASTY     SPACE OAR INSTILLATION N/A 10/28/2018   Procedure: SPACE OAR INSTILLATION;  Surgeon: Ottelin, Mark, MD;  Location: [facility];  Service: Urology;  Laterality: N/A;   Patient Active Problem List   Diagnosis Date Noted   Stress at home 08/04/2023   AKI (acute kidney injury) 07/21/2023   CAD (coronary artery disease) 06/23/2023   Malignant neoplasm of prostate (HCC) 06/08/2018   Overweight (BMI 25.0-29.9) 04/16/2017   HLD (hyperlipidemia) 03/17/2017   Central retinal vein occlusion, left eye (HCC) 09/18/2015   BPH (benign prostatic hyperplasia) 05/11/2015   Constipation 07/18/2014   Psoriasis 12/12/2012   Essential hypertension 12/17/2005   Spinal stenosis 12/17/2005    PCP: Shawn Sick, MD   REFERRING PROVIDER: Joane Artist RAMAN, MD   REFERRING DIAG: M54.42,M54.41,G89.29 (ICD-10-CM) - Chronic bilateral low back pain with bilateral sciatica   Rationale for Evaluation and Treatment: Rehabilitation  THERAPY DIAG:  No diagnosis found.  ONSET DATE: ***  SUBJECTIVE:  SUBJECTIVE STATEMENT: ***  PERTINENT HISTORY:  ***  PAIN:  Are you having pain? Yes: NPRS scale: *** Pain location: *** Pain description: *** Aggravating factors: *** Relieving factors: ***  PRECAUTIONS: None  RED FLAGS: Bowel or bladder incontinence: {Yes/No:304960894}   WEIGHT BEARING RESTRICTIONS: Yes ***  FALLS:  Has patient  fallen in last 6 months? {fallsyesno:27318}  LIVING ENVIRONMENT: Lives with: {[facility] lives with:25569::lives with their family} Lives in: {Lives in:25570} Stairs: {opstairs:27293} Has following equipment at home: {Assistive devices:23999}  OCCUPATION: ***  PLOF: {PLOF:24004}  PATIENT GOALS: ***  NEXT MD VISIT: ***  OBJECTIVE:  Note: Objective measures were completed at Evaluation unless otherwise noted.  DIAGNOSTIC FINDINGS:  MRI pending  PATIENT SURVEYS:  PSFS: THE PATIENT SPECIFIC FUNCTIONAL SCALE  Place score of 0-10 (0 = unable to perform activity and 10 = able to perform activity at the same level as before injury or problem)  Activity Date: 02/28/24         2.     3.     4.      Total Score ***      Total Score = Sum of activity scores/number of activities  Minimally Detectable Change: 3 points (for single activity); 2 points (for average score)  [facility] (nd). The Patient Specific Functional Scale . Retrieved from [email or web address]   COGNITION: Overall cognitive status: Within functional limits for tasks assessed     SENSATION: {sensation:27233}  MUSCLE LENGTH: Hamstrings: Right *** deg; Left *** deg Debby test: Right *** deg; Left *** deg  POSTURE: {posture:25561}  PALPATION: ***  LUMBAR ROM:   AROM Eval 02/28/24  Flexion   Extension   Right lateral flexion   Left lateral flexion   Right rotation   Left rotation    (Blank rows = not tested)  LOWER EXTREMITY ROM:     Active  Right eval Left eval  Hip flexion    Hip extension    Hip abduction    Hip adduction    Hip internal rotation    Hip external rotation    Knee flexion    Knee extension    Ankle dorsiflexion    Ankle plantarflexion    Ankle inversion    Ankle eversion     (Blank rows = not tested)  LOWER EXTREMITY MMT:    MMT Right eval Left eval  Hip flexion *** ***  Hip extension    Hip  abduction    Hip adduction    Hip internal rotation    Hip external rotation    Knee flexion    Knee extension    Ankle dorsiflexion    Ankle plantarflexion    Ankle inversion    Ankle eversion     (Blank rows = not tested)  LUMBAR SPECIAL TESTS:  Straight leg raise test: {pos/neg:25243}  FUNCTIONAL TESTS:  5 times sit to stand: ***  GAIT: Distance walked: *** Assistive device utilized: {Assistive devices:23999} Level of assistance: {Levels of assistance:24026} Comments: ***  TREATMENT DATE: 02/28/24 Initial evaluation of lumbar spine completed followed by instruction and trial set of HEP.  PATIENT EDUCATION:  Education details: HEP Person educated: Patient Education method: Solicitor, Actor cues, Verbal cues, and Handouts Education comprehension: verbalized understanding and returned demonstration  HOME EXERCISE PROGRAM: ***  ASSESSMENT:  CLINICAL IMPRESSION: Patient is a 78y.o. male who was seen today for physical therapy evaluation and treatment for lumbar spine/sciatica.  Patient presents with*** Patient would benefit from skilled physical therapy services to address the above deficits and return to previous LOA. OBJECTIVE IMPAIRMENTS: {opptimpairments:25111}.   ACTIVITY LIMITATIONS: {activitylimitations:27494}  PARTICIPATION LIMITATIONS: {participationrestrictions:25113}  PERSONAL FACTORS: {Personal factors:25162} are also affecting patient's functional outcome.   REHAB POTENTIAL: {rehabpotential:25112}  CLINICAL DECISION MAKING: {clinical decision making:25114}  EVALUATION COMPLEXITY: {Evaluation complexity:25115}   GOALS: Goals reviewed with patient? Yes  SHORT TERM GOALS: Target date: ***  Patient to be independent with HEP. Baseline: Goal status: INITIAL  2.  Decrease pain by 1 level. Baseline:   Goal status: INITIAL  3.  *** Baseline:  Goal status: INITIAL  4.  *** Baseline:  Goal status: INITIAL  5.  *** Baseline:  Goal status: INITIAL  6.  *** Baseline:  Goal status: INITIAL  LONG TERM GOALS: Target date: ***  Patient to be independent with self progressive HEP at discharge. Baseline:  Goal status: INITIAL  2.  Decreased radiating pain occurrence by 25% or more. Baseline:  Goal status: INITIAL  3.  Increase active range of motion by*** Baseline:  Goal status: INITIAL  4.  Increase strength of lumbar stabilizers by half a grade or more at discharge. Baseline:  Goal status: INITIAL  5.  Decrease max pain to 3 out of 10 or less at discharge. Baseline:  Goal status: INITIAL  6.  *** Baseline:  Goal status: INITIAL  PLAN:  PT FREQUENCY: {rehab frequency:25116}  PT DURATION: {rehab duration:25117}  PLANNED INTERVENTIONS: 97164- PT Re-evaluation, 97110-Therapeutic exercises, 97530- Therapeutic activity, 97112- Neuromuscular re-education, 97535- Self Care, 02859- Manual therapy, [phone]- Gait training, [phone]- Electrical stimulation (unattended), Patient/Family education, Balance training, Stair training, Joint mobilization, Spinal mobilization, Cryotherapy, and Moist heat.  PLAN FOR NEXT SESSION: ***   Maclaine Ahola April Ma L Izzy [place], PT 03/15/2024, 9:30 AM  "

## 2024-03-20 ENCOUNTER — Telehealth: Payer: Self-pay | Admitting: *Deleted

## 2024-03-20 DIAGNOSIS — M48061 Spinal stenosis, lumbar region without neurogenic claudication: Secondary | ICD-10-CM

## 2024-03-20 NOTE — Telephone Encounter (Unsigned)
 Copied from CRM [id number]. Topic: Clinical - Medication Refill >> Mar 20, 2024  9:07 AM Carrielelia G wrote: Medication: HYDROcodone -acetaminophen  (NORCO) 10-325 MG tablet  Has the patient contacted their pharmacy? No (Agent: If no, request that the patient contact the pharmacy for the refill. If patient does not wish to contact the pharmacy document the reason why and proceed with request.) (Agent: If yes, when and what did the pharmacy advise?)  This is the patient's preferred pharmacy:  [facility] [id number] - [place], [place] - [address] [facility] [address] [place] [address] Phone: [phone] Fax: [phone]  Is this the correct pharmacy for this prescription? Yes If no, delete pharmacy and type the correct one.    Is the patient out of the medication? No  Has the patient been seen for an appointment in the last year OR does the patient have an upcoming appointment? Yes  Can we respond through MyChart? No  Agent: Please be advised that Rx refills may take up to 3 business days. We ask that you follow-up with your pharmacy.

## 2024-03-21 MED ORDER — HYDROCODONE-ACETAMINOPHEN 10-325 MG PO TABS: 1.0000 | ORAL_TABLET | Freq: Three times a day (TID) | ORAL | 0 refills | Status: AC | PRN

## 2024-04-06 NOTE — Progress Notes (Unsigned)
"       ° °  LILLETTE Ileana Collet, PhD, LAT, ATC acting as a scribe for Artist Lloyd, MD.  Jeremy Hill is a 79 y.o. male who presents to [facility] today for 43-month f/u LBP. Pt was last seen by Dr. Lloyd on 02/07/24 and was referred to PT, but no-showed his 2 scheduled visit.  Today, pt reports ***  Pertinent review of systems: ***  Relevant historical information: ***   Exam:  There were no vitals taken for this visit. General: Well Developed, well nourished, and in no acute distress.   MSK: ***    Lab and Radiology Results No results found for this or any previous visit (from the past 72 hours). No results found.     Assessment and Plan: 79 y.o. male with ***   PDMP not reviewed this encounter. No orders of the defined types were placed in this encounter.  No orders of the defined types were placed in this encounter.    Discussed warning signs or symptoms. Please see discharge instructions. Patient expresses understanding.   ***  "

## 2024-04-10 ENCOUNTER — Ambulatory Visit: Admitting: Family Medicine

## 2024-07-13 ENCOUNTER — Encounter
